# Patient Record
Sex: Male | Born: 1950 | Race: White | Hispanic: No | Marital: Married | Smoking: Never smoker
Health system: Southern US, Community
[De-identification: ages and names within clinical notes are randomized; demographics above are authoritative.]

## PROBLEM LIST (undated history)

## (undated) DIAGNOSIS — R7301 Impaired fasting glucose: Secondary | ICD-10-CM

## (undated) DIAGNOSIS — I10 Essential (primary) hypertension: Secondary | ICD-10-CM

## (undated) DIAGNOSIS — J189 Pneumonia, unspecified organism: Secondary | ICD-10-CM

## (undated) DIAGNOSIS — N200 Calculus of kidney: Secondary | ICD-10-CM

## (undated) DIAGNOSIS — C61 Malignant neoplasm of prostate: Secondary | ICD-10-CM

## (undated) DIAGNOSIS — C4490 Unspecified malignant neoplasm of skin, unspecified: Secondary | ICD-10-CM

## (undated) DIAGNOSIS — Z87442 Personal history of urinary calculi: Secondary | ICD-10-CM

## (undated) DIAGNOSIS — E785 Hyperlipidemia, unspecified: Secondary | ICD-10-CM

## (undated) DIAGNOSIS — E119 Type 2 diabetes mellitus without complications: Secondary | ICD-10-CM

## (undated) DIAGNOSIS — C4359 Malignant melanoma of other part of trunk: Secondary | ICD-10-CM

## (undated) HISTORY — DX: Calculus of kidney: N20.0

## (undated) HISTORY — DX: Malignant melanoma of other part of trunk: C43.59

## (undated) HISTORY — PX: JOINT REPLACEMENT: SHX530

## (undated) HISTORY — DX: Impaired fasting glucose: R73.01

## (undated) HISTORY — PX: COLONOSCOPY: SHX174

## (undated) HISTORY — DX: Hyperlipidemia, unspecified: E78.5

## (undated) HISTORY — DX: Essential (primary) hypertension: I10

## (undated) HISTORY — DX: Type 2 diabetes mellitus without complications: E11.9

## (undated) HISTORY — PX: PROSTATE BIOPSY: SHX241

---
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
## 1981-06-13 DIAGNOSIS — N200 Calculus of kidney: Secondary | ICD-10-CM

## 1981-06-13 HISTORY — DX: Calculus of kidney: N20.0

## 2000-06-14 ENCOUNTER — Encounter: Payer: Self-pay | Admitting: Family Medicine

## 2002-08-05 ENCOUNTER — Encounter: Payer: Self-pay | Admitting: Internal Medicine

## 2008-05-20 ENCOUNTER — Ambulatory Visit: Payer: Self-pay | Admitting: Internal Medicine

## 2008-05-20 DIAGNOSIS — E119 Type 2 diabetes mellitus without complications: Secondary | ICD-10-CM | POA: Insufficient documentation

## 2008-05-20 DIAGNOSIS — E785 Hyperlipidemia, unspecified: Secondary | ICD-10-CM | POA: Insufficient documentation

## 2008-05-20 DIAGNOSIS — I10 Essential (primary) hypertension: Secondary | ICD-10-CM

## 2008-06-19 ENCOUNTER — Ambulatory Visit: Payer: Self-pay | Admitting: Internal Medicine

## 2008-06-23 LAB — CONVERTED CEMR LAB
Albumin: 4.1 g/dL (ref 3.5–5.2)
Chloride: 101 meq/L (ref 96–112)
Cholesterol: 231 mg/dL (ref 0–200)
Direct LDL: 154.4 mg/dL
GFR calc Af Amer: 112 mL/min
GFR calc non Af Amer: 92 mL/min
HDL: 46.7 mg/dL (ref >39.0)
Potassium: 4.5 meq/L (ref 3.5–5.1)
Total Protein: 7 g/dL (ref 6.0–8.3)
VLDL: 38 mg/dL (ref 0–40)

## 2008-07-24 ENCOUNTER — Encounter (INDEPENDENT_AMBULATORY_CARE_PROVIDER_SITE_OTHER): Payer: Self-pay | Admitting: *Deleted

## 2008-12-05 ENCOUNTER — Ambulatory Visit: Payer: Self-pay | Admitting: Internal Medicine

## 2008-12-30 ENCOUNTER — Ambulatory Visit: Payer: Self-pay | Admitting: Internal Medicine

## 2008-12-31 ENCOUNTER — Encounter: Payer: Self-pay | Admitting: Internal Medicine

## 2008-12-31 LAB — CONVERTED CEMR LAB: Fecal Occult Bld: NEGATIVE

## 2009-01-16 ENCOUNTER — Ambulatory Visit: Payer: Self-pay | Admitting: Internal Medicine

## 2009-01-16 LAB — CONVERTED CEMR LAB
BUN: 18 mg/dL (ref 6–23)
Basophils Absolute: 0 10*3/uL (ref 0.0–0.1)
CO2: 26 meq/L (ref 19–32)
Creatinine, Ser: 1 mg/dL (ref 0.4–1.5)
Eosinophils Relative: 3.2 % (ref 0.0–5.0)
Hgb A1c MFr Bld: 6.2 % (ref 4.6–6.5)
MCV: 89.8 fL (ref 78.0–100.0)
Monocytes Absolute: 0.5 10*3/uL (ref 0.1–1.0)
Neutrophils Relative %: 55.3 % (ref 43.0–77.0)
Phosphorus: 3.7 mg/dL (ref 2.3–4.6)
Platelets: 238 10*3/uL (ref 150.0–400.0)
RDW: 12.7 % (ref 11.5–14.6)
TSH: 0.65 microintl units/mL (ref 0.35–5.50)
WBC: 5.1 10*3/uL (ref 4.5–10.5)

## 2009-02-06 ENCOUNTER — Telehealth: Payer: Self-pay | Admitting: Internal Medicine

## 2009-10-27 ENCOUNTER — Ambulatory Visit: Payer: Self-pay | Admitting: Internal Medicine

## 2009-11-20 ENCOUNTER — Ambulatory Visit: Payer: Self-pay | Admitting: Internal Medicine

## 2009-11-23 LAB — CONVERTED CEMR LAB
AST: 26 units/L (ref 0–37)
Alkaline Phosphatase: 62 units/L (ref 39–117)
BUN: 18 mg/dL (ref 6–23)
Basophils Absolute: 0 10*3/uL (ref 0.0–0.1)
Bilirubin, Direct: 0.1 mg/dL (ref 0.0–0.3)
CO2: 29 meq/L (ref 19–32)
Calcium: 8.8 mg/dL (ref 8.4–10.5)
Chloride: 102 meq/L (ref 96–112)
Creatinine, Ser: 0.8 mg/dL (ref 0.4–1.5)
HCT: 42.9 % (ref 39.0–52.0)
Hemoglobin: 14.8 g/dL (ref 13.0–17.0)
Lymphs Abs: 2.1 10*3/uL (ref 0.7–4.0)
MCV: 88.9 fL (ref 78.0–100.0)
Monocytes Absolute: 0.7 10*3/uL (ref 0.1–1.0)
Neutro Abs: 3.2 10*3/uL (ref 1.4–7.7)
Platelets: 272 10*3/uL (ref 150.0–400.0)
Potassium: 3.9 meq/L (ref 3.5–5.1)
RDW: 13.8 % (ref 11.5–14.6)
Total Bilirubin: 0.5 mg/dL (ref 0.3–1.2)
Total CHOL/HDL Ratio: 4
Triglycerides: 121 mg/dL (ref 0.0–149.0)

## 2010-06-16 ENCOUNTER — Ambulatory Visit
Admission: RE | Admit: 2010-06-16 | Discharge: 2010-06-16 | Payer: Self-pay | Source: Home / Self Care | Attending: Family Medicine | Admitting: Family Medicine

## 2010-06-16 DIAGNOSIS — H60339 Swimmer's ear, unspecified ear: Secondary | ICD-10-CM | POA: Insufficient documentation

## 2010-07-12 ENCOUNTER — Ambulatory Visit: Admit: 2010-07-12 | Payer: Self-pay | Admitting: Internal Medicine

## 2010-07-13 NOTE — Assessment & Plan Note (Signed)
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  Past Medical History: Reviewed history from 05/20/2008 and no changes required. Hyperlipidemia Hypertension Impaired fasting glucose  Past Surgical History: Reviewed history from 05/20/2008 and no changes required. Abdominal ultrasound--fatty liver 1/02 1969 Cartilage removal right knee  Family History: Reviewed history from 05/20/2008 and no changes required. Dad died of lung cancer @70 . Had CVA Mom died @70  MI, then MRSA 2 brothers--1 with early CAD in 27's HTN in everybody No diabetes No colon or prostate cancer  Social History: Reviewed history from 12/05/2008 and no changes required. Retired then went back, then unemployed. Always in retail management Married--2 daughters Never Smoked Alcohol use-yes Tries to exercise  Review of Systems       weight is stable ongoing sleep problems---he relates to stress. 4-5 hours per night only for decades  Physical Exam  General:  alert and normal appearance.   Neck:  supple, no masses, no thyromegaly, no  carotid bruits, and no cervical lymphadenopathy.   Lungs:  normal respiratory effort and normal breath sounds.   Heart:  normal rate, regular rhythm, no murmur, and no gallop.   Abdomen:  soft and non-tender.   Msk:  no joint tenderness and no joint swelling.   Pulses:  2+ in feet Extremities:  no edema Psych:  normally interactive, good eye contact, not anxious appearing, and not depressed appearing.     Impression & Recommendations:  Problem # 1:  HYPERTENSION (ICD-401.9) up some no changes for now discussed fitness  His updated medication list for this problem includes:    Hyzaar 50-12.5 Mg Tabs (Losartan potassium-hctz) .Marland Kitchen... 1 tab daily for high blood pressure  BP today: 148/90 Prior BP: 130/84 (12/05/2008)  Labs Reviewed: K+: 3.9 (01/16/2009) Creat: : 1.0 (01/16/2009)   Chol: 231 (06/19/2008)   HDL: 46.7 (06/19/2008)   LDL: DEL (06/19/2008)   TG: 189 (06/19/2008)  Problem # 2:  HYPERLIPIDEMIA (ICD-272.4) Assessment: Unchanged reasonable with meds no changes   His updated medication list for this problem includes:    Pravastatin Sodium 40 Mg Tabs (Pravastatin sodium) .Marland Kitchen... 1 daily  Labs Reviewed: SGOT: 31 (06/19/2008)   SGPT: 38 (06/19/2008)   HDL:46.7 (06/19/2008)  LDL:DEL (06/19/2008)  Chol:231 (06/19/2008)  Trig:189 (06/19/2008)  Problem # 3:  IMPAIRED FASTING GLUCOSE (ICD-790.21) Assessment: Comment Only discussed high risk for diabetes in the next few years  Complete Medication List: 1)  Pravastatin Sodium 40 Mg Tabs (Pravastatin sodium) .Marland KitchenMarland KitchenMarland Kitchen  1 daily 2)  Hyzaar 50-12.5 Mg Tabs (Losartan potassium-hctz) .Marland Kitchen.. 1 tab daily for high blood pressure  Patient Instructions: 1)  Please set up fasting labs soon 2)  HgbA1c (790.21), lipid, hepatic (272.4), renal, CBC with diff (401.9) 3)  Please schedule a follow-up appointment in 6 months for physical Prescriptions: HYZAAR 50-12.5 MG TABS (LOSARTAN POTASSIUM-HCTZ) 1 tab daily for high blood pressure  #30 x 12    Entered by:   Mervin Hack CMA (AAMA)   Authorized by:   Cindee Salt MD   Signed by:   Mervin Hack CMA (AAMA) on 10/27/2009   Method used:   Electronically to        [facility] (retail)       [address]       [place], [place]  [address]       Ph: [phone]       Fax: [phone]   RxID:   [id number] PRAVASTATIN SODIUM 40 MG TABS (PRAVASTATIN SODIUM) 1 daily  #30 Tablet x 12   Entered by:   Mervin Hack CMA (AAMA)   Authorized by:   Cindee Salt MD   Signed by:   Mervin Hack CMA (AAMA) on 10/27/2009   Method used:   Electronically to        [facility] (retail)       [address]       [place], [place]  [address]       Ph: [phone]       Fax: [phone]   RxID:   [id number]   Current Allergies (reviewed today): No known allergies

## 2010-07-15 NOTE — Assessment & Plan Note (Signed)
Summary: EAR PAIN/CLE   Vital Signs:  Patient profile:   60 year old male Height:      70 inches Weight:      232.25 pounds BMI:     33.44 Temp:     98 degrees F oral Pulse rate:   60 / minute Pulse rhythm:   regular BP sitting:   122 / 76  (left arm) Cuff size:   large  Vitals Entered By: Delilah Shan CMA Brinley Rosete Dull) (June 16, 2010 4:05 PM) CC: Ear Pain   History of Present Illness: R ear pain.  Feels clogged.  'I thought it was going to get better.'  Is sore to touch.  going for about 3 weeks.  No FCNAVDR.  Feeling well o/w.    Allergies: No Known Drug Allergies  Review of Systems       See HPI.  Otherwise negative.    Physical Exam  General:  no apparent distress normocephalic atraumatic L tm wnl R tm partiall obscured with purulent debris in the canal.  mastoid and pinna not tender to palpation  no la mucous membranes moist neck supple   Impression & Recommendations:  Problem # 1:  OTITIS EXTERNA, ACUTE, RIGHT (ICD-380.12) I didn't clear the canal today since he has been reported tender when he tried to clean his ear with qtip. I adivsed him not to clean with qtip.  He still has some clearance (so I think the drops can get deep enough to work) and he is okay for outpatient follow up.  I don't think this is fungal- he doesn't have uncontrolled DM.  follow up as needed.  He agrees.  Orders: Prescription Created Electronically [id number])  His updated medication list for this problem includes:    Cortisporin 3.5-10000-1 Soln (Neomycin-polymyxin-hc) .Marland KitchenMarland KitchenMarland KitchenMarland Kitchen 4 drops in the r ear three times a day,  leave in for 5 minutes  Complete Medication List: 1)  Pravastatin Sodium 40 Mg Tabs (Pravastatin sodium) .Marland Kitchen.. 1 daily 2)  Hyzaar 50-12.5 Mg Tabs (Losartan potassium-hctz) .Marland Kitchen.. 1 tab daily for high blood pressure 3)  Vitamin C 500 Mg Tabs (Ascorbic acid) .... Once daily 4)  Vitamin B Complex-c Caps (B complex-c) .... Once daily 5)  Fish Oil Oil (Fish oil) .... Once daily 6)   Cortisporin 3.5-10000-1 Soln (Neomycin-polymyxin-hc) .... 4 drops in the r ear three times a day,  leave in for 5 minutes  Patient Instructions: 1)  Use the drops as directed and let us know if you aren't improving.  Take care.  Prescriptions: CORTISPORIN 3.5-10000-1 SOLN (NEOMYCIN-POLYMYXIN-HC) 4 drops in the R ear three times a day,  leave in for 5 minutes  #48mL x 0   Entered and Authorized by:   Crawford Givens MD   Signed by:   Crawford Givens MD on 06/16/2010   Method used:   Electronically to        [facility] [phone]* (retail)       [address] [place], [place]  [address]       Ph: [phone]       Fax: [phone]   RxID:   [id number]    Orders Added: 1)  Est. Patient Level III [40347] 2)  Prescription Created Electronically [phone]    Current Allergies (reviewed today): No known allergies

## 2010-09-12 HISTORY — PX: OTHER SURGICAL HISTORY: SHX169

## 2010-10-22 ENCOUNTER — Other Ambulatory Visit: Payer: Self-pay | Admitting: *Deleted

## 2010-10-22 MED ORDER — LOSARTAN POTASSIUM-HCTZ 50-12.5 MG PO TABS: 1.0000 | ORAL_TABLET | Freq: Every day | ORAL | Status: DC

## 2010-10-25 ENCOUNTER — Other Ambulatory Visit: Payer: Self-pay | Admitting: *Deleted

## 2010-10-25 MED ORDER — LOSARTAN POTASSIUM-HCTZ 50-12.5 MG PO TABS: 1.0000 | ORAL_TABLET | Freq: Every day | ORAL | Status: DC

## 2011-06-14 DIAGNOSIS — C4359 Malignant melanoma of other part of trunk: Secondary | ICD-10-CM

## 2011-06-14 DIAGNOSIS — C4490 Unspecified malignant neoplasm of skin, unspecified: Secondary | ICD-10-CM

## 2011-06-14 HISTORY — DX: Unspecified malignant neoplasm of skin, unspecified: C44.90

## 2011-06-14 HISTORY — DX: Malignant melanoma of other part of trunk: C43.59

## 2011-08-15 ENCOUNTER — Encounter: Payer: Self-pay | Admitting: Internal Medicine

## 2011-08-17 ENCOUNTER — Encounter: Payer: Self-pay | Admitting: Internal Medicine

## 2011-08-17 ENCOUNTER — Ambulatory Visit (INDEPENDENT_AMBULATORY_CARE_PROVIDER_SITE_OTHER): Payer: BC Managed Care – PPO | Admitting: Internal Medicine

## 2011-08-17 VITALS — BP 144/70 | HR 66 | Temp 98.4°F | Ht 71.0 in | Wt 232.0 lb

## 2011-08-17 DIAGNOSIS — Z0000 Encounter for general adult medical examination without abnormal findings: Secondary | ICD-10-CM | POA: Insufficient documentation

## 2011-08-17 DIAGNOSIS — R7301 Impaired fasting glucose: Secondary | ICD-10-CM

## 2011-08-17 DIAGNOSIS — I10 Essential (primary) hypertension: Secondary | ICD-10-CM

## 2011-08-17 DIAGNOSIS — Z85820 Personal history of malignant melanoma of skin: Secondary | ICD-10-CM | POA: Insufficient documentation

## 2011-08-17 DIAGNOSIS — Z1211 Encounter for screening for malignant neoplasm of colon: Secondary | ICD-10-CM

## 2011-08-17 DIAGNOSIS — E785 Hyperlipidemia, unspecified: Secondary | ICD-10-CM

## 2011-08-17 LAB — BASIC METABOLIC PANEL
CO2: 28 mEq/L (ref 19–32)
Calcium: 9.5 mg/dL (ref 8.4–10.5)
Glucose, Bld: 135 mg/dL — ABNORMAL HIGH (ref 70–99)
Sodium: 136 mEq/L (ref 135–145)

## 2011-08-17 LAB — LIPID PANEL
HDL: 53.2 mg/dL (ref >39.00)
Triglycerides: 245 mg/dL — ABNORMAL HIGH (ref 0.0–149.0)

## 2011-08-17 LAB — HEPATIC FUNCTION PANEL
Albumin: 4 g/dL (ref 3.5–5.2)
Total Protein: 6.7 g/dL (ref 6.0–8.3)

## 2011-08-17 LAB — CBC WITH DIFFERENTIAL/PLATELET
Basophils Absolute: 0 10*3/uL (ref 0.0–0.1)
Eosinophils Absolute: 0.1 10*3/uL (ref 0.0–0.7)
HCT: 45.2 % (ref 39.0–52.0)
Hemoglobin: 15.2 g/dL (ref 13.0–17.0)
Lymphocytes Relative: 26.6 % (ref 12.0–46.0)
Lymphs Abs: 1.4 10*3/uL (ref 0.7–4.0)
MCHC: 33.6 g/dL (ref 30.0–36.0)
Monocytes Absolute: 0.6 10*3/uL (ref 0.1–1.0)
Neutro Abs: 3.1 10*3/uL (ref 1.4–7.7)
RDW: 14.1 % (ref 11.5–14.6)

## 2011-08-17 LAB — HEMOGLOBIN A1C: Hgb A1c MFr Bld: 6.3 % (ref 4.6–6.5)

## 2011-08-17 MED ORDER — LOSARTAN POTASSIUM-HCTZ 50-12.5 MG PO TABS: 1.0000 | ORAL_TABLET | Freq: Every day | ORAL | Status: DC

## 2011-08-17 MED ORDER — PRAVASTATIN SODIUM 40 MG PO TABS: 40.0000 mg | ORAL_TABLET | Freq: Every day | ORAL | Status: DC

## 2011-08-17 NOTE — Assessment & Plan Note (Signed)
Really needs to lose weight If over 126--will refer to [facility]

## 2011-08-17 NOTE — Assessment & Plan Note (Signed)
Lab Results  Component Value Date   LDLCALC 97 11/20/2009   Will recheck labs on med

## 2011-08-17 NOTE — Progress Notes (Signed)
Subjective:    Patient ID: Ricardo Reese, male    DOB: [birth year], 61 y.o.   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 fish oil-omega-3 fatty acids 1000 MG capsule Take 1 g by mouth daily.        No Known Allergies  Past Medical History  Diagnosis Date  . Hyperlipidemia   . Hypertension   . Impaired fasting glucose   . Melanoma of back 2013    Dr Adolphus Birchwood    Past Surgical History  Procedure Date  . Knee surgery 1969    cartilage removal right knee     Family History  Problem Relation Age of Onset  . Stroke Father   . Diabetes Neg Hx     History   Social History  . Marital Status: Married    Spouse Name: N/A    Number of Children: 2  . Years of Education: N/A   Occupational History  . Store Radiation protection practitioner   Social History Main Topics  . Smoking status: Never Smoker   . Smokeless tobacco: Never Used  . Alcohol Use: No  . Drug Use: No  . Sexually Active: Not on file   Other Topics Concern  . Not on file   Social History Narrative  . No narrative on file   Review of Systems  Constitutional: Negative for fatigue and unexpected weight change.       Wears seat belt  HENT: Negative for hearing loss, congestion, rhinorrhea and tinnitus.        Needed tooth implant Regular with dentist  Eyes: Negative for visual disturbance.       No diplopia or unilateral vision loss  Respiratory: Negative for cough, chest tightness and shortness of breath.   Cardiovascular:  Negative for chest pain, palpitations and leg swelling.  Gastrointestinal: Negative for nausea, vomiting, abdominal pain, constipation and blood in stool.       No heartburn  Genitourinary: Negative for dysuria, urgency, frequency and difficulty urinating.       No sexual problems  Musculoskeletal: Positive for arthralgias. Negative for back pain and joint swelling.       Occ right knee pain--nothing persistent and no meds  Skin: Negative for rash.       Melanoma as noted  Neurological: Negative for dizziness, syncope, weakness, light-headedness, numbness and headaches.  Hematological: Negative for adenopathy. Does not bruise/bleed easily.  Psychiatric/Behavioral: Negative for sleep disturbance and dysphoric mood. The patient is nervous/anxious.        Only sleeps 4 hours a night. No change. No daytime somnolence Worried about the melanoma and maybe needing to move for a job       Objective:   Physical Exam  Constitutional: He is oriented to person, place, and time. He appears well-developed and well-nourished. No distress.  HENT:  Head: Normocephalic and atraumatic.  Right Ear: External ear normal.  Left Ear: External ear normal.  Mouth/Throat: Oropharynx is clear  and moist. No oropharyngeal exudate.  Eyes: Conjunctivae and EOM are normal. Pupils are equal, round, and reactive to light.  Neck: Normal range of motion. Neck supple. No thyromegaly present.  Cardiovascular: Normal rate, regular rhythm, normal heart sounds and intact distal pulses.  Exam reveals no gallop.   No murmur heard. Pulmonary/Chest: Effort normal and breath sounds normal. No respiratory distress. He has no wheezes. He has no rales.  Abdominal: Soft. There is no tenderness.  Musculoskeletal: Normal range of motion. He exhibits no edema and no tenderness.  Lymphadenopathy:    He has no cervical adenopathy.  Neurological: He is alert and oriented to person, place, and time.  Skin: No rash noted.       Multiple  benign nevi  Psychiatric: He has a normal mood and affect. His behavior is normal. Judgment and thought content normal.          Assessment & Plan:

## 2011-08-17 NOTE — Assessment & Plan Note (Signed)
Generally healthy but needs to work on fitness Will do PSA after discussion Colonoscopy

## 2011-08-17 NOTE — Assessment & Plan Note (Signed)
BP Readings from Last 3 Encounters:  08/17/11 144/70  06/16/10 122/76  10/27/09 148/90   Not at goal  Discussed lifestyle  No changes for now

## 2011-08-22 ENCOUNTER — Encounter: Payer: Self-pay | Admitting: Internal Medicine

## 2011-08-31 ENCOUNTER — Encounter: Payer: Self-pay | Admitting: *Deleted

## 2011-09-29 ENCOUNTER — Ambulatory Visit (AMBULATORY_SURGERY_CENTER): Payer: BC Managed Care – PPO | Admitting: *Deleted

## 2011-09-29 ENCOUNTER — Encounter: Payer: Self-pay | Admitting: Internal Medicine

## 2011-09-29 VITALS — Ht 71.0 in | Wt 227.8 lb

## 2011-09-29 DIAGNOSIS — Z1211 Encounter for screening for malignant neoplasm of colon: Secondary | ICD-10-CM

## 2011-09-29 MED ORDER — PEG-KCL-NACL-NASULF-NA ASC-C 100 G PO SOLR: ORAL | Status: DC

## 2011-10-12 ENCOUNTER — Other Ambulatory Visit: Payer: BC Managed Care – PPO | Admitting: Internal Medicine

## 2011-10-31 ENCOUNTER — Other Ambulatory Visit: Payer: Self-pay | Admitting: Internal Medicine

## 2011-10-31 DIAGNOSIS — R7301 Impaired fasting glucose: Secondary | ICD-10-CM

## 2011-11-02 ENCOUNTER — Other Ambulatory Visit: Payer: BC Managed Care – PPO

## 2011-11-03 ENCOUNTER — Encounter: Payer: Self-pay | Admitting: Internal Medicine

## 2011-11-03 ENCOUNTER — Ambulatory Visit (AMBULATORY_SURGERY_CENTER): Payer: BC Managed Care – PPO | Admitting: Internal Medicine

## 2011-11-03 VITALS — BP 182/92 | HR 59 | Temp 97.2°F | Resp 20 | Ht 71.0 in | Wt 227.0 lb

## 2011-11-03 DIAGNOSIS — Z1211 Encounter for screening for malignant neoplasm of colon: Secondary | ICD-10-CM

## 2011-11-03 MED ORDER — SODIUM CHLORIDE 0.9 % IV SOLN: 500.0000 mL | INTRAVENOUS | Status: DC

## 2011-11-03 NOTE — Patient Instructions (Signed)

## 2011-11-03 NOTE — Progress Notes (Signed)
Patient did not experience any of the following events: a burn prior to discharge; a fall within the facility; wrong site/side/patient/procedure/implant event; or a hospital transfer or hospital admission upon discharge from the facility. (G8907) Patient did not have preoperative order for IV antibiotic SSI prophylaxis. (G8918)  

## 2011-11-03 NOTE — Op Note (Signed)
[facility] [address] [place], [place]  [address]  COLONOSCOPY PROCEDURE REPORT  PATIENT:  Ricardo Reese, Ricardo Reese  MR#:  [id number] BIRTHDATE:  1950-08-09, 61 yrs. old  GENDER:  male ENDOSCOPIST:  Iva Boop, MD, [facility] REF. BY:  Tillman Abide, M.D. PROCEDURE DATE:  11/03/2011 PROCEDURE:  Colonoscopy 19147 ASA CLASS:  Class II INDICATIONS:  Routine Risk Screening MEDICATIONS:   These medications were titrated to patient response per physician's verbal order, Fentanyl 50 mcg IV, Versed 8 mg IV  DESCRIPTION OF PROCEDURE:   After the risks benefits and alternatives of the procedure were thoroughly explained, informed consent was obtained.  Digital rectal exam was performed and revealed no abnormalities and normal prostate.   The LB [id number] endoscope was introduced through the anus and advanced to the cecum, which was identified by both the appendix and ileocecal valve, without limitations.  The quality of the prep was excellent, using MoviPrep.  The instrument was then slowly withdrawn as the colon was fully examined. <<PROCEDUREIMAGES>>  FINDINGS:  A normal appearing cecum, ileocecal valve, and appendiceal orifice were identified. The ascending, hepatic flexure, transverse, splenic flexure, descending, sigmoid colon, and rectum appeared unremarkable. Including right colon retroflexion.   Retroflexed views in the rectum revealed no abnormalities.    The time to cecum = 1:48 minutes. The scope was then withdrawn in 9:44 minutes from the cecum and the procedure completed. COMPLICATIONS:  None ENDOSCOPIC IMPRESSION: 1) Normal colonoscopy, excellent prep  REPEAT EXAM:  In 10 year(s) for routine screening colonoscopy.  Iva Boop, MD, [facility]  CC:  Karie Schwalbe, MD and The Patient  n. eSIGNED:   Iva Boop at 11/03/2011 01:34 PM  [place], [place], [phone]

## 2011-11-03 NOTE — Progress Notes (Signed)
Blood pressure high- 182/92, 172/118, Dr. Leone Payor aware.  Pt didn't take his Hyzaar today  1320 BP 147/69

## 2011-11-04 ENCOUNTER — Telehealth: Payer: Self-pay | Admitting: *Deleted

## 2011-11-04 NOTE — Telephone Encounter (Signed)
  Follow up Call-  Call back number 11/03/2011  Post procedure Call Back phone  # [phone]  Permission to leave phone message Yes     Left message.

## 2011-11-09 ENCOUNTER — Other Ambulatory Visit (INDEPENDENT_AMBULATORY_CARE_PROVIDER_SITE_OTHER): Payer: BC Managed Care – PPO

## 2011-11-09 DIAGNOSIS — R7301 Impaired fasting glucose: Secondary | ICD-10-CM

## 2011-11-09 LAB — GLUCOSE, RANDOM: Glucose, Bld: 114 mg/dL — ABNORMAL HIGH (ref 70–99)

## 2011-11-11 ENCOUNTER — Encounter: Payer: Self-pay | Admitting: *Deleted

## 2011-11-15 ENCOUNTER — Other Ambulatory Visit: Payer: Self-pay | Admitting: *Deleted

## 2011-11-15 MED ORDER — LOSARTAN POTASSIUM-HCTZ 50-12.5 MG PO TABS: 1.0000 | ORAL_TABLET | Freq: Every day | ORAL | Status: DC

## 2011-11-23 ENCOUNTER — Encounter: Payer: Self-pay | Admitting: Internal Medicine

## 2012-04-17 ENCOUNTER — Telehealth: Payer: Self-pay | Admitting: *Deleted

## 2012-04-17 NOTE — Telephone Encounter (Signed)
Patient's wife calling asking for rx for shingles vaccine sent to [facility] in [place], pt has canceled all future appts and last seen march 2013

## 2012-04-18 NOTE — Telephone Encounter (Signed)
Okay to send Rx for zostavax

## 2012-04-19 MED ORDER — ZOSTER VACCINE LIVE 19400 UNT/0.65ML ~~LOC~~ SOLR: 0.6500 mL | Freq: Once | SUBCUTANEOUS | Status: DC

## 2012-04-19 NOTE — Telephone Encounter (Signed)
rx sent to pharmacy by e-script Spoke with patient's wife and advised results  

## 2012-08-16 ENCOUNTER — Encounter: Payer: BC Managed Care – PPO | Admitting: Internal Medicine

## 2012-08-24 ENCOUNTER — Other Ambulatory Visit: Payer: Self-pay | Admitting: Internal Medicine

## 2012-09-21 ENCOUNTER — Encounter: Payer: BC Managed Care – PPO | Admitting: Internal Medicine

## 2012-09-22 ENCOUNTER — Other Ambulatory Visit: Payer: Self-pay | Admitting: Internal Medicine

## 2012-10-21 ENCOUNTER — Other Ambulatory Visit: Payer: Self-pay | Admitting: Internal Medicine

## 2012-11-23 ENCOUNTER — Other Ambulatory Visit: Payer: Self-pay | Admitting: Internal Medicine

## 2012-12-25 ENCOUNTER — Other Ambulatory Visit: Payer: Self-pay | Admitting: Internal Medicine

## 2012-12-25 NOTE — Telephone Encounter (Signed)
This pt has canceled the last 3 physicals, and now has another scheduled 01/04/13, ok to refill? Last seen 08/2011

## 2012-12-25 NOTE — Telephone Encounter (Signed)
Refill for 1 month only

## 2013-01-04 ENCOUNTER — Ambulatory Visit (INDEPENDENT_AMBULATORY_CARE_PROVIDER_SITE_OTHER): Payer: BC Managed Care – PPO | Admitting: Internal Medicine

## 2013-01-04 ENCOUNTER — Encounter: Payer: Self-pay | Admitting: Internal Medicine

## 2013-01-04 VITALS — BP 136/72 | HR 62 | Temp 98.5°F | Wt 228.0 lb

## 2013-01-04 DIAGNOSIS — E785 Hyperlipidemia, unspecified: Secondary | ICD-10-CM

## 2013-01-04 DIAGNOSIS — R7301 Impaired fasting glucose: Secondary | ICD-10-CM

## 2013-01-04 DIAGNOSIS — I10 Essential (primary) hypertension: Secondary | ICD-10-CM

## 2013-01-04 DIAGNOSIS — Z0000 Encounter for general adult medical examination without abnormal findings: Secondary | ICD-10-CM

## 2013-01-04 LAB — CBC WITH DIFFERENTIAL/PLATELET
Basophils Absolute: 0 10*3/uL (ref 0.0–0.1)
Basophils Relative: 0.5 % (ref 0.0–3.0)
Eosinophils Relative: 2.1 % (ref 0.0–5.0)
HCT: 46.5 % (ref 39.0–52.0)
Hemoglobin: 15.3 g/dL (ref 13.0–17.0)
Lymphocytes Relative: 27.2 % (ref 12.0–46.0)
Lymphs Abs: 1.8 10*3/uL (ref 0.7–4.0)
Monocytes Relative: 11.6 % (ref 3.0–12.0)
Neutro Abs: 3.9 10*3/uL (ref 1.4–7.7)
RBC: 5.21 Mil/uL (ref 4.22–5.81)
WBC: 6.7 10*3/uL (ref 4.5–10.5)

## 2013-01-04 LAB — BASIC METABOLIC PANEL
Calcium: 9.4 mg/dL (ref 8.4–10.5)
GFR: 95.91 mL/min (ref >60.00)
Glucose, Bld: 113 mg/dL — ABNORMAL HIGH (ref 70–99)
Potassium: 4 mEq/L (ref 3.5–5.1)
Sodium: 136 mEq/L (ref 135–145)

## 2013-01-04 LAB — HEPATIC FUNCTION PANEL
AST: 21 U/L (ref 0–37)
Albumin: 4.3 g/dL (ref 3.5–5.2)
Alkaline Phosphatase: 57 U/L (ref 39–117)
Bilirubin, Direct: 0 mg/dL (ref 0.0–0.3)
Total Protein: 7.3 g/dL (ref 6.0–8.3)

## 2013-01-04 LAB — LIPID PANEL
HDL: 47.6 mg/dL (ref >39.00)
LDL Cholesterol: 112 mg/dL — ABNORMAL HIGH (ref 0–99)
Total CHOL/HDL Ratio: 4
VLDL: 28.2 mg/dL (ref 0.0–40.0)

## 2013-01-04 LAB — HEMOGLOBIN A1C: Hgb A1c MFr Bld: 6.4 % (ref 4.6–6.5)

## 2013-01-04 MED ORDER — LOSARTAN POTASSIUM-HCTZ 50-12.5 MG PO TABS: 1.0000 | ORAL_TABLET | Freq: Every day | ORAL | Status: DC

## 2013-01-04 MED ORDER — PRAVASTATIN SODIUM 40 MG PO TABS: 40.0000 mg | ORAL_TABLET | Freq: Every day | ORAL | Status: DC

## 2013-01-04 NOTE — Assessment & Plan Note (Signed)
Has been more regular with his meds

## 2013-01-04 NOTE — Assessment & Plan Note (Signed)
BP Readings from Last 3 Encounters:  01/04/13 136/72  11/03/11 182/92  08/17/11 144/70   BP is okay now

## 2013-01-04 NOTE — Patient Instructions (Signed)
DASH Diet  The DASH diet stands for "Dietary Approaches to Stop Hypertension." It is a healthy eating plan that has been shown to reduce high blood pressure (hypertension) in as little as 14 days, while also possibly providing other significant health benefits. These other health benefits include reducing the risk of breast cancer after menopause and reducing the risk of type 2 diabetes, heart disease, colon cancer, and stroke. Health benefits also include weight loss and slowing kidney failure in patients with chronic kidney disease.   DIET GUIDELINES  · Limit salt (sodium). Your diet should contain less than 1500 mg of sodium daily.  · Limit refined or processed carbohydrates. Your diet should include mostly whole grains. Desserts and added sugars should be used sparingly.  · Include small amounts of heart-healthy fats. These types of fats include nuts, oils, and tub margarine. Limit saturated and trans fats. These fats have been shown to be harmful in the body.  CHOOSING FOODS   The following food groups are based on a 2000 calorie diet. See your Registered Dietitian for individual calorie needs.  Grains and Grain Products (6 to 8 servings daily)  · Eat More Often: Whole-wheat bread, brown rice, whole-grain or wheat pasta, quinoa, popcorn without added fat or salt (air popped).  · Eat Less Often: White bread, white pasta, white rice, cornbread.  Vegetables (4 to 5 servings daily)  · Eat More Often: Fresh, frozen, and canned vegetables. Vegetables may be raw, steamed, roasted, or grilled with a minimal amount of fat.  · Eat Less Often/Avoid: Creamed or fried vegetables. Vegetables in a cheese sauce.  Fruit (4 to 5 servings daily)  · Eat More Often: All fresh, canned (in natural juice), or frozen fruits. Dried fruits without added sugar. One hundred percent fruit juice (½ cup [237 mL] daily).  · Eat Less Often: Dried fruits with added sugar. Canned fruit in light or heavy syrup.  Lean Meats, Fish, and Poultry (2  servings or less daily. One serving is 3 to 4 oz [85-114 g]).  · Eat More Often: Ninety percent or leaner ground beef, tenderloin, sirloin. Round cuts of beef, chicken breast, turkey breast. All fish. Grill, bake, or broil your meat. Nothing should be fried.  · Eat Less Often/Avoid: Fatty cuts of meat, turkey, or chicken leg, thigh, or wing. Fried cuts of meat or fish.  Dairy (2 to 3 servings)  · Eat More Often: Low-fat or fat-free milk, low-fat plain or light yogurt, reduced-fat or part-skim cheese.  · Eat Less Often/Avoid: Milk (whole, 2%). Whole milk yogurt. Full-fat cheeses.  Nuts, Seeds, and Legumes (4 to 5 servings per week)  · Eat More Often: All without added salt.  · Eat Less Often/Avoid: Salted nuts and seeds, canned beans with added salt.  Fats and Sweets (limited)  · Eat More Often: Vegetable oils, tub margarines without trans fats, sugar-free gelatin. Mayonnaise and salad dressings.  · Eat Less Often/Avoid: Coconut oils, palm oils, butter, stick margarine, cream, half and half, cookies, candy, pie.  FOR MORE INFORMATION  The Dash Diet Eating Plan: www.dashdiet.org  Document Released: 05/19/2011 Document Revised: 08/22/2011 Document Reviewed: 05/19/2011  ExitCare® Patient Information ©2014 [employer].

## 2013-01-04 NOTE — Assessment & Plan Note (Addendum)
Healthy but out of shape Discussed fitness PSA next year

## 2013-01-04 NOTE — Progress Notes (Signed)
Subjective:    Patient ID: Ricardo Reese, male    DOB: 01-27-1951, 62 y.o.   MRN: [id number]  HPI Here for physical Feels fine Walks a lot at work-- but no other exercise Discussed   Reviewed cholesterol levels last year Missed a lot of doses Discussed taking in the morning  Current Outpatient Prescriptions on File Prior to Visit  Medication Sig Dispense Refill  . Ascorbic Acid (VITAMIN C) 1000 MG tablet Take 1,000 mg by mouth daily.      Marland Kitchen aspirin 81 MG tablet Take 81 mg by mouth daily.      . B Complex-C (SUPER B COMPLEX PO) Take by mouth daily.      . fish oil-omega-3 fatty acids 1000 MG capsule Take 1 g by mouth daily.      Marland Kitchen losartan-hydrochlorothiazide (HYZAAR) 50-12.5 MG per tablet TAKE ONE TABLET BY MOUTH EVERY DAY  30 tablet  0  . pravastatin (PRAVACHOL) 40 MG tablet TAKE ONE TABLET BY MOUTH EVERY DAY  30 tablet  0   No current facility-administered medications on file prior to visit.    No Known Allergies  Past Medical History  Diagnosis Date  . Hyperlipidemia   . Hypertension   . Impaired fasting glucose   . Melanoma of back 2013    Dr Adolphus Birchwood  . Kidney stone 1983  . Cancer     basal cell- face    Past Surgical History  Procedure Laterality Date  . Knee surgery  1969    cartilage removal right knee   . Melanoma back  09/2010    Family History  Problem Relation Age of Onset  . Stroke Father   . Diabetes Neg Hx   . Colon cancer Neg Hx   . Stomach cancer Neg Hx   . Esophageal cancer Neg Hx   . Rectal cancer Neg Hx     History   Social History  . Marital Status: Married    Spouse Name: N/A    Number of Children: 2  . Years of Education: N/A   Occupational History  . Store Radiation protection practitioner   Social History Main Topics  . Smoking status: Never Smoker   . Smokeless tobacco: Never Used  . Alcohol Use: Yes     Comment: occasional beer  . Drug Use: No  . Sexually Active: Not on file   Other Topics Concern  . Not on file   Social History  Narrative  . No narrative on file   Review of Systems  Constitutional: Negative for fatigue and unexpected weight change.       Wears seat belt  HENT: Positive for dental problem. Negative for hearing loss, congestion, rhinorrhea and tinnitus.        Recent implant  Eyes: Negative for visual disturbance.       No diplopia or unilateral vision loss  Respiratory: Positive for shortness of breath. Negative for cough and chest tightness.        Some DOE--like carrying grandchild up stairs  Cardiovascular: Negative for chest pain, palpitations and leg swelling.  Gastrointestinal: Negative for nausea, vomiting, abdominal pain, constipation and blood in stool.       No heartburn  Endocrine: Negative for cold intolerance and heat intolerance.  Genitourinary: Negative for urgency, frequency and difficulty urinating.       No ED  Musculoskeletal: Negative for back pain, joint swelling and arthralgias.  Skin: Negative for rash.       Regular with Dr Adolphus Birchwood  Allergic/Immunologic: Negative for environmental allergies and immunocompromised state.  Neurological: Negative for dizziness, syncope, weakness, light-headedness, numbness and headaches.  Hematological: Negative for adenopathy. Does not bruise/bleed easily.  Psychiatric/Behavioral: Negative for sleep disturbance and dysphoric mood. The patient is not nervous/anxious.        Objective:   Physical Exam  Constitutional: He is oriented to person, place, and time. He appears well-developed and well-nourished. No distress.  HENT:  Head: Normocephalic and atraumatic.  Right Ear: External ear normal.  Left Ear: External ear normal.  Mouth/Throat: Oropharynx is clear and moist. No oropharyngeal exudate.  Eyes: Conjunctivae and EOM are normal. Pupils are equal, round, and reactive to light.  Neck: Normal range of motion. Neck supple. No thyromegaly present.  Cardiovascular: Normal rate, regular rhythm, normal heart sounds and intact distal  pulses.  Exam reveals no gallop.   No murmur heard. Pulmonary/Chest: Effort normal and breath sounds normal. No respiratory distress. He has no wheezes. He has no rales.  Abdominal: Soft. There is no tenderness.  Musculoskeletal: He exhibits no edema and no tenderness.  Lymphadenopathy:    He has no cervical adenopathy.  Neurological: He is alert and oriented to person, place, and time.  Skin: No rash noted. No erythema.  Multiple nevi  Psychiatric: He has a normal mood and affect. His behavior is normal.          Assessment & Plan:

## 2013-01-04 NOTE — Addendum Note (Signed)
Addended by: Sueanne Margarita on: 01/04/2013 12:48 PM   Modules accepted: Orders

## 2013-01-04 NOTE — Assessment & Plan Note (Signed)
Must lose some weight

## 2013-01-07 ENCOUNTER — Encounter: Payer: Self-pay | Admitting: *Deleted

## 2014-01-07 ENCOUNTER — Encounter: Payer: BC Managed Care – PPO | Admitting: Internal Medicine

## 2014-02-20 ENCOUNTER — Other Ambulatory Visit: Payer: Self-pay | Admitting: Internal Medicine

## 2014-02-24 ENCOUNTER — Telehealth: Payer: Self-pay | Admitting: Internal Medicine

## 2014-02-24 MED ORDER — LOSARTAN POTASSIUM-HCTZ 50-12.5 MG PO TABS: 1.0000 | ORAL_TABLET | Freq: Every day | ORAL | Status: DC

## 2014-02-24 MED ORDER — PRAVASTATIN SODIUM 40 MG PO TABS: 40.0000 mg | ORAL_TABLET | Freq: Every day | ORAL | Status: DC

## 2014-02-24 NOTE — Telephone Encounter (Signed)
Spoke with pt and advised did not see a second request for med from [place] but would refill pravastatin and losartan-HCTZ. Apologized to pt and pt was appreciative.

## 2014-02-24 NOTE — Telephone Encounter (Signed)
I have a unique set of circumstances going on in life right now, and I need your help, please. I maintain my home address in [place], however, i am working in [place], [place]. So I am now out of my 2 prescriptions, and i don't seem to be getting anywhere getting them filled. i am utilizing the [employer] in [place], [place]. 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 The Pharmacist called [place] at [facility], where my Doctor is located, and the he was told that i would need to make an appointment. Okay, so i called and made an appointment. My appointment is on March 26, 2014 with Dr. Silvio Pate. The person I was speaking with at the [facility] told me I would need to speak to the Triage Nurse to let her/him know that the store would be re-faxing the refill request. So, the phone rang and went to an automated message system. I then left a verbal message for the nurse regarding the request. The [place] Pharmacist then re-faxed the request to the [place] offrice 2 days ago, and still no response from that office. As I stated, I am now out of both medications, I follwed your rules, made an appointment, tried to talk to the triage individual, left a verbal message, and I have gotten no response from anybody at [employer]. I am more than frustrated at this point, and I do not understand what the issue is. I need to get these medications, but nobody at [facility] seems to care about that, and ultimately me. I was under the impression that you are a Acupuncturist. I don't understand why it is so hard to get someone to communicate with me from [place]. Doesn't seem like any of you care about at least 1 of your patients. me. I can be reached via email, [email or web address], cell phone [phone], or land line [phone]. I look forward to a response.   This message was sent via [email or web address] contact us section.

## 2014-03-26 ENCOUNTER — Encounter: Payer: Self-pay | Admitting: Internal Medicine

## 2014-03-26 ENCOUNTER — Ambulatory Visit (INDEPENDENT_AMBULATORY_CARE_PROVIDER_SITE_OTHER): Payer: 59 | Admitting: Internal Medicine

## 2014-03-26 VITALS — BP 132/70 | HR 67 | Temp 97.9°F | Resp 14 | Ht 71.0 in | Wt 235.2 lb

## 2014-03-26 DIAGNOSIS — I10 Essential (primary) hypertension: Secondary | ICD-10-CM

## 2014-03-26 DIAGNOSIS — E785 Hyperlipidemia, unspecified: Secondary | ICD-10-CM

## 2014-03-26 DIAGNOSIS — Z0000 Encounter for general adult medical examination without abnormal findings: Secondary | ICD-10-CM

## 2014-03-26 DIAGNOSIS — Z1211 Encounter for screening for malignant neoplasm of colon: Secondary | ICD-10-CM | POA: Diagnosis not present

## 2014-03-26 DIAGNOSIS — R7301 Impaired fasting glucose: Secondary | ICD-10-CM

## 2014-03-26 LAB — COMPREHENSIVE METABOLIC PANEL
ALBUMIN: 3.9 g/dL (ref 3.5–5.2)
ALT: 61 U/L — ABNORMAL HIGH (ref 0–53)
AST: 38 U/L — ABNORMAL HIGH (ref 0–37)
Alkaline Phosphatase: 59 U/L (ref 39–117)
BUN: 17 mg/dL (ref 6–23)
CHLORIDE: 103 meq/L (ref 96–112)
CO2: 27 meq/L (ref 19–32)
Calcium: 9.4 mg/dL (ref 8.4–10.5)
Creatinine, Ser: 0.9 mg/dL (ref 0.4–1.5)
GFR: 96.83 mL/min (ref >60.00)
GLUCOSE: 118 mg/dL — AB (ref 70–99)
POTASSIUM: 4.5 meq/L (ref 3.5–5.1)
SODIUM: 137 meq/L (ref 135–145)
TOTAL PROTEIN: 7.4 g/dL (ref 6.0–8.3)
Total Bilirubin: 0.5 mg/dL (ref 0.2–1.2)

## 2014-03-26 LAB — CBC WITH DIFFERENTIAL/PLATELET
BASOS ABS: 0 10*3/uL (ref 0.0–0.1)
Basophils Relative: 0.5 % (ref 0.0–3.0)
EOS PCT: 2.2 % (ref 0.0–5.0)
Eosinophils Absolute: 0.1 10*3/uL (ref 0.0–0.7)
HCT: 45.4 % (ref 39.0–52.0)
Hemoglobin: 15.1 g/dL (ref 13.0–17.0)
Lymphocytes Relative: 25.2 % (ref 12.0–46.0)
Lymphs Abs: 1.6 10*3/uL (ref 0.7–4.0)
MCHC: 33.2 g/dL (ref 30.0–36.0)
MCV: 89.1 fl (ref 78.0–100.0)
MONO ABS: 0.7 10*3/uL (ref 0.1–1.0)
MONOS PCT: 10.2 % (ref 3.0–12.0)
NEUTROS PCT: 61.9 % (ref 43.0–77.0)
Neutro Abs: 4 10*3/uL (ref 1.4–7.7)
PLATELETS: 244 10*3/uL (ref 150.0–400.0)
RBC: 5.09 Mil/uL (ref 4.22–5.81)
RDW: 14 % (ref 11.5–15.5)
WBC: 6.5 10*3/uL (ref 4.0–10.5)

## 2014-03-26 LAB — LIPID PANEL
CHOLESTEROL: 209 mg/dL — AB (ref 0–200)
HDL: 49.3 mg/dL (ref >39.00)
LDL Cholesterol: 135 mg/dL — ABNORMAL HIGH (ref 0–99)
NonHDL: 159.7
Total CHOL/HDL Ratio: 4
Triglycerides: 126 mg/dL (ref 0.0–149.0)
VLDL: 25.2 mg/dL (ref 0.0–40.0)

## 2014-03-26 LAB — PSA: PSA: 3.45 ng/mL (ref 0.10–4.00)

## 2014-03-26 LAB — T4, FREE: FREE T4: 0.68 ng/dL (ref 0.60–1.60)

## 2014-03-26 LAB — HEMOGLOBIN A1C: HEMOGLOBIN A1C: 6.3 % (ref 4.6–6.5)

## 2014-03-26 MED ORDER — SILDENAFIL CITRATE 20 MG PO TABS: 60.0000 mg | ORAL_TABLET | Freq: Every day | ORAL | Status: DC | PRN

## 2014-03-26 NOTE — Assessment & Plan Note (Signed)
Due for PSA--after discussion UTD otherwise Will get flu shot at his store

## 2014-03-26 NOTE — Progress Notes (Signed)
Subjective:    Patient ID: Ricardo Reese, male    DOB: [birth year], 63 y.o.   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 Ascorbic Acid (VITAMIN C) 1000 MG tablet Take 1,000 mg by mouth daily.      Marland Kitchen aspirin 81 MG tablet Take 81 mg by mouth daily.      . B Complex-C (SUPER B COMPLEX PO) Take by mouth daily.      . fish oil-omega-3 fatty acids 1000 MG capsule Take 1 g by mouth daily.      Marland Kitchen losartan-hydrochlorothiazide (HYZAAR) 50-12.5 MG per tablet Take 1 tablet by mouth daily.  90 tablet  0  . pravastatin (PRAVACHOL) 40 MG tablet Take 1 tablet (40 mg total) by mouth daily.  90 tablet  0   No current facility-administered medications on file prior to visit.    No Known Allergies  Past Medical History  Diagnosis Date  . Hyperlipidemia   . Hypertension   . Impaired fasting glucose   . Melanoma of back 2013    Dr Evorn Gong  . Kidney stone 1983  . Cancer     basal cell- face    Past Surgical History  Procedure Laterality Date  . Knee surgery  1969    cartilage removal right knee   . Melanoma back  09/2010    Family History  Problem Relation Age of Onset  . Stroke Father   . Diabetes Neg Hx   . Colon cancer Neg Hx   . Stomach cancer Neg Hx   . Esophageal cancer Neg Hx   . Rectal cancer Neg Hx     History   Social History  . Marital Status: Married    Spouse Name: N/A    Number of Children: 2  . Years of Education: N/A   Occupational History  . 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 Smoking status: Never Smoker   . Smokeless tobacco: Never Used  . Alcohol Use: Yes     Comment:  occasional beer  . Drug Use: No  . Sexual Activity: Not on file   Other Topics Concern  . Not on file   Social History Narrative  . No narrative on file   Review of Systems  Constitutional: Negative for fatigue and unexpected weight change.       Wears seat belt  HENT: Negative for dental problem, hearing loss and tinnitus.        Regular with dentist  Eyes: Negative for visual disturbance.       No diplopia or unilateral vision loss  Respiratory: Negative for cough, chest tightness and shortness of breath.   Cardiovascular: Negative for chest pain, palpitations and leg swelling.  Gastrointestinal: Negative for nausea, vomiting, abdominal pain, constipation and blood in stool.       No heartburn  Endocrine: Negative for polydipsia and polyuria.  Genitourinary: Negative for urgency, frequency and difficulty urinating.       Intermittent ED--may not be able maintain erection  Musculoskeletal: Negative for arthralgias, back pain and joint swelling.  Skin: Negative for rash.       Sees Dr  Dasher regularly  Allergic/Immunologic: Negative for environmental allergies and immunocompromised state.  Neurological: Negative for dizziness, syncope, weakness, light-headedness, numbness and headaches.  Hematological: Negative for adenopathy. Does not bruise/bleed easily.  Psychiatric/Behavioral: Negative for sleep disturbance and dysphoric mood. The patient is not nervous/anxious.        Objective:   Physical Exam  Constitutional: He is oriented to person, place, and time. He appears well-developed and well-nourished. No distress.  HENT:  Head: Normocephalic and atraumatic.  Right Ear: External ear normal.  Left Ear: External ear normal.  Mouth/Throat: Oropharynx is clear and moist. No oropharyngeal exudate.  Eyes: Conjunctivae and EOM are normal. Pupils are equal, round, and reactive to light.  Neck: Normal range of motion. Neck supple. No thyromegaly present.  Cardiovascular: Normal  rate, regular rhythm, normal heart sounds and intact distal pulses.  Exam reveals no gallop.   No murmur heard. Pulmonary/Chest: Effort normal and breath sounds normal. No respiratory distress. He has no wheezes. He has no rales.  Abdominal: Soft. He exhibits no distension. There is no tenderness. There is no rebound and no guarding.  Musculoskeletal: He exhibits no edema and no tenderness.  Lymphadenopathy:    He has no cervical adenopathy.  Neurological: He is alert and oriented to person, place, and time.  Skin: No rash noted.  Multiple nevi  Psychiatric: He has a normal mood and affect. His behavior is normal.          Assessment & Plan:

## 2014-03-26 NOTE — Progress Notes (Signed)
Pre visit review using our clinic review tool, if applicable. No additional management support is needed unless otherwise documented below in the visit note. 

## 2014-03-26 NOTE — Assessment & Plan Note (Signed)
No problems with the statin Will check labs

## 2014-03-26 NOTE — Assessment & Plan Note (Signed)
BP Readings from Last 3 Encounters:  03/26/14 132/70  01/04/13 136/72  11/03/11 182/92   Good control Due for labs

## 2014-03-26 NOTE — Assessment & Plan Note (Signed)
Discussed fitness and weight loss Will check labs

## 2014-03-26 NOTE — Patient Instructions (Signed)
DASH Eating Plan °DASH stands for "Dietary Approaches to Stop Hypertension." The DASH eating plan is a healthy eating plan that has been shown to reduce high blood pressure (hypertension). Additional health benefits may include reducing the risk of type 2 diabetes mellitus, heart disease, and stroke. The DASH eating plan may also help with weight loss. °WHAT DO I NEED TO KNOW ABOUT THE DASH EATING PLAN? °For the DASH eating plan, you will follow these general guidelines: °· Choose foods with a percent daily value for sodium of less than 5% (as listed on the food label). °· Use salt-free seasonings or herbs instead of table salt or sea salt. °· Check with your health care provider or pharmacist before using salt substitutes. °· Eat lower-sodium products, often labeled as "lower sodium" or "no salt added." °· Eat fresh foods. °· Eat more vegetables, fruits, and low-fat dairy products. °· Choose whole grains. Look for the word "whole" as the first word in the ingredient list. °· Choose fish and skinless chicken or turkey more often than red meat. Limit fish, poultry, and meat to 6 oz (170 g) each day. °· Limit sweets, desserts, sugars, and sugary drinks. °· Choose heart-healthy fats. °· Limit cheese to 1 oz (28 g) per day. °· Eat more home-cooked food and less restaurant, buffet, and fast food. °· Limit fried foods. °· Cook foods using methods other than frying. °· Limit canned vegetables. If you do use them, rinse them well to decrease the sodium. °· When eating at a restaurant, ask that your food be prepared with less salt, or no salt if possible. °WHAT FOODS CAN I EAT? °Seek help from a dietitian for individual calorie needs. °Grains °Whole grain or whole wheat bread. Brown rice. Whole grain or whole wheat pasta. Quinoa, bulgur, and whole grain cereals. Low-sodium cereals. Corn or whole wheat flour tortillas. Whole grain cornbread. Whole grain crackers. Low-sodium crackers. °Vegetables °Fresh or frozen vegetables  (raw, steamed, roasted, or grilled). Low-sodium or reduced-sodium tomato and vegetable juices. Low-sodium or reduced-sodium tomato sauce and paste. Low-sodium or reduced-sodium canned vegetables.  °Fruits °All fresh, canned (in natural juice), or frozen fruits. °Meat and Other Protein Products °Ground beef (85% or leaner), grass-fed beef, or beef trimmed of fat. Skinless chicken or turkey. Ground chicken or turkey. Pork trimmed of fat. All fish and seafood. Eggs. Dried beans, peas, or lentils. Unsalted nuts and seeds. Unsalted canned beans. °Dairy °Low-fat dairy products, such as skim or 1% milk, 2% or reduced-fat cheeses, low-fat ricotta or cottage cheese, or plain low-fat yogurt. Low-sodium or reduced-sodium cheeses. °Fats and Oils °Tub margarines without trans fats. Light or reduced-fat mayonnaise and salad dressings (reduced sodium). Avocado. Safflower, olive, or canola oils. Natural peanut or almond butter. °Other °Unsalted popcorn and pretzels. °The items listed above may not be a complete list of recommended foods or beverages. Contact your dietitian for more options. °WHAT FOODS ARE NOT RECOMMENDED? °Grains °White bread. White pasta. White rice. Refined cornbread. Bagels and croissants. Crackers that contain trans fat. °Vegetables °Creamed or fried vegetables. Vegetables in a cheese sauce. Regular canned vegetables. Regular canned tomato sauce and paste. Regular tomato and vegetable juices. °Fruits °Dried fruits. Canned fruit in light or heavy syrup. Fruit juice. °Meat and Other Protein Products °Fatty cuts of meat. Ribs, chicken wings, bacon, sausage, bologna, salami, chitterlings, fatback, hot dogs, bratwurst, and packaged luncheon meats. Salted nuts and seeds. Canned beans with salt. °Dairy °Whole or 2% milk, cream, half-and-half, and cream cheese. Whole-fat or sweetened yogurt. Full-fat   cheeses or blue cheese. Nondairy creamers and whipped toppings. Processed cheese, cheese spreads, or cheese  curds. °Condiments °Onion and garlic salt, seasoned salt, table salt, and sea salt. Canned and packaged gravies. Worcestershire sauce. Tartar sauce. Barbecue sauce. Teriyaki sauce. Soy sauce, including reduced sodium. Steak sauce. Fish sauce. Oyster sauce. Cocktail sauce. Horseradish. Ketchup and mustard. Meat flavorings and tenderizers. Bouillon cubes. Hot sauce. Tabasco sauce. Marinades. Taco seasonings. Relishes. °Fats and Oils °Butter, stick margarine, lard, shortening, ghee, and bacon fat. Coconut, palm kernel, or palm oils. Regular salad dressings. °Other °Pickles and olives. Salted popcorn and pretzels. °The items listed above may not be a complete list of foods and beverages to avoid. Contact your dietitian for more information. °WHERE CAN I FIND MORE INFORMATION? °National Heart, Lung, and Blood Institute: www.nhlbi.nih.gov/health/health-topics/topics/dash/ °Document Released: 05/19/2011 Document Revised: 10/14/2013 Document Reviewed: 04/03/2013 °ExitCare® Patient Information ©2015 ExitCare, LLC. This information is not intended to replace advice given to you by your health care provider. Make sure you discuss any questions you have with your health care provider. ° °

## 2014-03-27 ENCOUNTER — Telehealth: Payer: Self-pay | Admitting: Internal Medicine

## 2014-03-27 NOTE — Telephone Encounter (Signed)
emmi emailed °

## 2014-05-26 ENCOUNTER — Other Ambulatory Visit: Payer: Self-pay | Admitting: Internal Medicine

## 2015-03-31 ENCOUNTER — Encounter: Payer: 59 | Admitting: Internal Medicine

## 2015-04-02 DIAGNOSIS — Z0289 Encounter for other administrative examinations: Secondary | ICD-10-CM

## 2015-04-03 ENCOUNTER — Encounter: Payer: Self-pay | Admitting: Internal Medicine

## 2015-04-03 ENCOUNTER — Telehealth: Payer: Self-pay | Admitting: Internal Medicine

## 2015-04-03 NOTE — Telephone Encounter (Signed)
Had been working in [place] so I am not sure how much time he spends up here now. If he still lives here, set him up for PE when he thinks he will be around

## 2015-04-03 NOTE — Telephone Encounter (Signed)
Pt did not come in for their appt today for cpe. Please let me know if pt needs to be contacted immediately for follow up or no follow up needed. Best phone number to contact pt is [phone].

## 2015-04-24 NOTE — Telephone Encounter (Signed)
Left voicemail for patient to call back and reschedule appt.

## 2015-06-02 ENCOUNTER — Other Ambulatory Visit: Payer: Self-pay | Admitting: Internal Medicine

## 2015-06-02 NOTE — Telephone Encounter (Addendum)
[facility] from [facility] left v/m requesting refill for BP meds that have already been requested and denied; pt has CPX scheduled for 12/23/15; [facility] wants to know if refills can be done.

## 2015-06-02 NOTE — Telephone Encounter (Signed)
I didn't see he had an appt in July, pt has canceled all of his appts in 2016, 03/31/15, 04/03/2015 & 06/11/15, we could have gotten him in sooner than july for a physical, last seen 03/26/2014

## 2015-06-03 MED ORDER — PRAVASTATIN SODIUM 40 MG PO TABS: 40.0000 mg | ORAL_TABLET | Freq: Every day | ORAL | Status: DC

## 2015-06-03 MED ORDER — LOSARTAN POTASSIUM-HCTZ 50-12.5 MG PO TABS: 1.0000 | ORAL_TABLET | Freq: Every day | ORAL | Status: DC

## 2015-06-03 NOTE — Addendum Note (Signed)
Addended by: Despina Hidden on: 06/03/2015 09:00 AM   Modules accepted: Orders

## 2015-06-03 NOTE — Telephone Encounter (Signed)
.  left message to have patient return my call.  

## 2015-06-03 NOTE — Telephone Encounter (Signed)
Moved pt's appt to feb 23, rx sent to pharmacy by e-script

## 2015-06-03 NOTE — Telephone Encounter (Signed)
Okay to fill #90 x 0 for each Change his appt to within the 90 days and if he cancels that, I cannot refill anymore

## 2015-06-11 ENCOUNTER — Encounter: Payer: Self-pay | Admitting: Internal Medicine

## 2015-08-06 ENCOUNTER — Ambulatory Visit (INDEPENDENT_AMBULATORY_CARE_PROVIDER_SITE_OTHER): Payer: Managed Care, Other (non HMO) | Admitting: Internal Medicine

## 2015-08-06 ENCOUNTER — Encounter: Payer: Self-pay | Admitting: Internal Medicine

## 2015-08-06 VITALS — BP 148/80 | HR 75 | Temp 98.4°F | Ht 69.5 in | Wt 232.8 lb

## 2015-08-06 DIAGNOSIS — R7301 Impaired fasting glucose: Secondary | ICD-10-CM | POA: Diagnosis not present

## 2015-08-06 DIAGNOSIS — E785 Hyperlipidemia, unspecified: Secondary | ICD-10-CM

## 2015-08-06 DIAGNOSIS — Z0000 Encounter for general adult medical examination without abnormal findings: Secondary | ICD-10-CM

## 2015-08-06 DIAGNOSIS — I10 Essential (primary) hypertension: Secondary | ICD-10-CM | POA: Diagnosis not present

## 2015-08-06 MED ORDER — LOSARTAN POTASSIUM-HCTZ 100-25 MG PO TABS: 1.0000 | ORAL_TABLET | Freq: Every day | ORAL | Status: DC

## 2015-08-06 MED ORDER — PRAVASTATIN SODIUM 40 MG PO TABS: 40.0000 mg | ORAL_TABLET | Freq: Every day | ORAL | Status: DC

## 2015-08-06 NOTE — Assessment & Plan Note (Signed)
Generally healthy Needs to work on fitness Defer PSA to next year at least Colon due 2023

## 2015-08-06 NOTE — Assessment & Plan Note (Signed)
Continue statin for primary prevention. 

## 2015-08-06 NOTE — Assessment & Plan Note (Signed)
Will recheck labs 

## 2015-08-06 NOTE — Progress Notes (Signed)
Subjective:    Patient ID: Ricardo Reese, male    DOB: 1950/10/04, 65 y.o.   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 Ascorbic Acid (VITAMIN C) 1000 MG tablet Take 1,000 mg by mouth daily.    Marland Kitchen aspirin 81 MG tablet Take 81 mg by mouth daily.    . B Complex-C (SUPER B COMPLEX PO) Take by mouth daily.    . fish oil-omega-3 fatty acids 1000 MG capsule Take 1 g by mouth daily.    Marland Kitchen losartan-hydrochlorothiazide (HYZAAR) 50-12.5 MG tablet Take 1 tablet by mouth daily. 90 tablet 0  . pravastatin (PRAVACHOL) 40 MG tablet Take 1 tablet (40 mg total) by mouth daily. 90 tablet 0  . sildenafil (REVATIO) 20 MG tablet Take 3-5 tablets (60-100 mg total) by mouth daily as needed. 50 tablet 11   No current facility-administered medications on file prior to visit.    No Known Allergies  Past Medical History  Diagnosis Date  . Hyperlipidemia   . Hypertension   . Impaired fasting glucose   . Melanoma of back [facility]) 2013    Dr Evorn Gong  . Kidney stone 1983  . Cancer ([place])     basal cell- face    Past Surgical History  Procedure Laterality Date  . Knee surgery  1969    cartilage removal right knee   . Melanoma back  09/2010    Family History  Problem Relation Age of Onset  . Stroke Father   . Diabetes Neg Hx   . Colon cancer Neg Hx   . Stomach cancer Neg Hx   . Esophageal cancer Neg Hx   . Rectal cancer Neg Hx   . Heart disease Brother     Social History   Social History  . Marital Status: Married    Spouse  Name: N/A  . Number of Children: 2  . Years of Education: N/A   Occupational History  . 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 Smoking status: Never Smoker   . Smokeless tobacco: Never Used  . Alcohol Use: Yes     Comment: occasional beer  . Drug Use: No  . Sexual Activity: Not on file   Other Topics Concern  . Not on file   Social History Narrative   Review of Systems  Constitutional: Negative for fatigue.       Wears seat belt  HENT: Negative for dental problem, hearing loss and tinnitus.        Keeps up with dentist  Eyes: Negative for visual disturbance.       No diplopia or unilateral vision loss  Respiratory: Negative for cough, chest tightness and shortness of breath.   Cardiovascular: Negative for chest pain, palpitations and leg swelling.  Gastrointestinal: Negative for nausea, vomiting, abdominal pain, constipation and blood in stool.       No heartburn  Endocrine: Negative for polydipsia and polyuria.  Genitourinary: Negative for urgency, frequency  and difficulty urinating.       Hasn't needed the sildenafil  Musculoskeletal: Negative for back pain and joint swelling.       Still some right knee pain  Skin:       Recent derm visit  Allergic/Immunologic: Negative for environmental allergies and immunocompromised state.  Neurological: Negative for dizziness, syncope, weakness, light-headedness and headaches.  Hematological: Negative for adenopathy. Does not bruise/bleed easily.  Psychiatric/Behavioral: Positive for sleep disturbance. Negative for dysphoric mood. The patient is not nervous/anxious.        Objective:   Physical Exam  Constitutional: He is oriented to person, place, and time. He appears well-developed and well-nourished.  HENT:  Head: Normocephalic and atraumatic.  Right Ear: External ear normal.  Left Ear: External ear normal.  Mouth/Throat: Oropharynx is clear and moist.  Eyes: Conjunctivae are normal. Pupils are equal, round,  and reactive to light.  Neck: Normal range of motion. Neck supple. No thyromegaly present.  Cardiovascular: Normal rate, regular rhythm, normal heart sounds and intact distal pulses.  Exam reveals no gallop.   No murmur heard. Pulmonary/Chest: Effort normal and breath sounds normal. No respiratory distress. He has no wheezes. He has no rales.  Abdominal: Soft. There is no tenderness.  Musculoskeletal: He exhibits no edema or tenderness.  Lymphadenopathy:    He has no cervical adenopathy.  Neurological: He is alert and oriented to person, place, and time.  Skin: No rash noted.  Multiple nevi  Psychiatric: He has a normal mood and affect. His behavior is normal.          Assessment & Plan:

## 2015-08-06 NOTE — Progress Notes (Signed)
Pre visit review using our clinic review tool, if applicable. No additional management support is needed unless otherwise documented below in the visit note. 

## 2015-08-06 NOTE — Assessment & Plan Note (Signed)
BP Readings from Last 3 Encounters:  08/06/15 148/80  03/26/14 132/70  01/04/13 136/72   Not great control Was higher when I rechecked Will increase med

## 2015-08-07 LAB — COMPREHENSIVE METABOLIC PANEL
ALBUMIN: 4.7 g/dL (ref 3.5–5.2)
ALK PHOS: 80 U/L (ref 39–117)
ALT: 40 U/L (ref 0–53)
AST: 33 U/L (ref 0–37)
BUN: 21 mg/dL (ref 6–23)
CALCIUM: 10 mg/dL (ref 8.4–10.5)
CHLORIDE: 102 meq/L (ref 96–112)
CO2: 27 mEq/L (ref 19–32)
CREATININE: 0.94 mg/dL (ref 0.40–1.50)
GFR: 85.84 mL/min (ref >60.00)
Glucose, Bld: 99 mg/dL (ref 70–99)
Potassium: 4.1 mEq/L (ref 3.5–5.1)
Sodium: 138 mEq/L (ref 135–145)
Total Bilirubin: 0.4 mg/dL (ref 0.2–1.2)
Total Protein: 7.9 g/dL (ref 6.0–8.3)

## 2015-08-07 LAB — LIPID PANEL
CHOLESTEROL: 195 mg/dL (ref 0–200)
HDL: 63.9 mg/dL (ref >39.00)
LDL CALC: 96 mg/dL (ref 0–99)
NonHDL: 130.61
TRIGLYCERIDES: 173 mg/dL — AB (ref 0.0–149.0)
Total CHOL/HDL Ratio: 3
VLDL: 34.6 mg/dL (ref 0.0–40.0)

## 2015-08-07 LAB — CBC WITH DIFFERENTIAL/PLATELET
Basophils Absolute: 0 10*3/uL (ref 0.0–0.1)
Basophils Relative: 0.4 % (ref 0.0–3.0)
EOS ABS: 0.1 10*3/uL (ref 0.0–0.7)
EOS PCT: 2 % (ref 0.0–5.0)
HCT: 45 % (ref 39.0–52.0)
HEMOGLOBIN: 15.1 g/dL (ref 13.0–17.0)
Lymphocytes Relative: 28.5 % (ref 12.0–46.0)
Lymphs Abs: 2.1 10*3/uL (ref 0.7–4.0)
MCHC: 33.7 g/dL (ref 30.0–36.0)
MCV: 86.4 fl (ref 78.0–100.0)
MONO ABS: 0.8 10*3/uL (ref 0.1–1.0)
Monocytes Relative: 10.1 % (ref 3.0–12.0)
Neutro Abs: 4.4 10*3/uL (ref 1.4–7.7)
Neutrophils Relative %: 59 % (ref 43.0–77.0)
Platelets: 301 10*3/uL (ref 150.0–400.0)
RBC: 5.21 Mil/uL (ref 4.22–5.81)
RDW: 14.5 % (ref 11.5–15.5)
WBC: 7.5 10*3/uL (ref 4.0–10.5)

## 2015-08-07 LAB — HEMOGLOBIN A1C: Hgb A1c MFr Bld: 6.7 % — ABNORMAL HIGH (ref 4.6–6.5)

## 2015-12-23 ENCOUNTER — Encounter: Payer: Self-pay | Admitting: Internal Medicine

## 2016-06-10 ENCOUNTER — Ambulatory Visit (INDEPENDENT_AMBULATORY_CARE_PROVIDER_SITE_OTHER): Payer: Managed Care, Other (non HMO) | Admitting: Internal Medicine

## 2016-06-10 ENCOUNTER — Encounter: Payer: Self-pay | Admitting: Internal Medicine

## 2016-06-10 VITALS — BP 150/82 | HR 68 | Wt 228.0 lb

## 2016-06-10 DIAGNOSIS — M25552 Pain in left hip: Secondary | ICD-10-CM

## 2016-06-10 NOTE — Progress Notes (Signed)
Pre visit review using our clinic review tool, if applicable. No additional management support is needed unless otherwise documented below in the visit note. 

## 2016-06-10 NOTE — Assessment & Plan Note (Signed)
PE findings suggest severe osteoarthritis Will set up with ortho for evaluation

## 2016-06-10 NOTE — Progress Notes (Signed)
   Subjective:    Patient ID: Ricardo Reese, male    DOB: [birth year], 65 y.o.   MRN: [id number]  HPI Here due to hip pain--leftr  Has gone on for a while but not going away Started lightly around 3 months ago Will get throbbing if he walks for a while Pain along entire thigh and down to his shin No tenderness Tight when he first gets up from sitting  Aleve not helping No leg swelling No apparent injury  Current Outpatient Prescriptions on File Prior to Visit  Medication Sig Dispense Refill  . Ascorbic Acid (VITAMIN C) 1000 MG tablet Take 1,000 mg by mouth daily.    Marland Kitchen aspirin 81 MG tablet Take 81 mg by mouth daily.    . B Complex-C (SUPER B COMPLEX PO) Take by mouth daily.    . fish oil-omega-3 fatty acids 1000 MG capsule Take 1 g by mouth daily.    Marland Kitchen losartan-hydrochlorothiazide (HYZAAR) 100-25 MG tablet Take 1 tablet by mouth daily. 90 tablet 3  . pravastatin (PRAVACHOL) 40 MG tablet Take 1 tablet (40 mg total) by mouth daily. 90 tablet 3  . sildenafil (REVATIO) 20 MG tablet Take 3-5 tablets (60-100 mg total) by mouth daily as needed. 50 tablet 11   No current facility-administered medications on file prior to visit.     No Known Allergies  Past Medical History:  Diagnosis Date  . Cancer ([place])    basal cell- face  . Hyperlipidemia   . Hypertension   . Impaired fasting glucose   . Kidney stone 1983  . Melanoma of back [facility]) 2013   Dr Evorn Gong    Past Surgical History:  Procedure Laterality Date  . KNEE SURGERY  1969   cartilage removal right knee   . melanoma back  09/2010    Family History  Problem Relation Age of Onset  . Stroke Father   . Diabetes Neg Hx   . Colon cancer Neg Hx   . Stomach cancer Neg Hx   . Esophageal cancer Neg Hx   . Rectal cancer Neg Hx   . Heart disease Brother     Social History   Social History  . Marital status: Married    Spouse name: N/A  . Number of children: 2  . Years of education: N/A   Occupational History  . Store  Leisure centre manager   Social History Main Topics  . Smoking status: Never Smoker  . Smokeless tobacco: Never Used  . Alcohol use Yes     Comment: occasional beer  . Drug use: No  . Sexual activity: Not on file   Other Topics Concern  . Not on file   Social History Narrative  . No narrative on file   Review of Systems No joint swelling No fever Doesn't feel sick No apparent hernia Retiring very soon    Objective:   Physical Exam  Cardiovascular: Intact distal pulses.   Musculoskeletal:  No spine tenderness No left hip tenderness Almost no internal rotation and limited external rotation and pain with passive ext rotation  Neurological:  Obvious antalgic gait--- like hip No weakness          Assessment & Plan:

## 2016-06-14 ENCOUNTER — Ambulatory Visit: Payer: Managed Care, Other (non HMO) | Admitting: Internal Medicine

## 2016-06-15 ENCOUNTER — Ambulatory Visit: Payer: Managed Care, Other (non HMO) | Admitting: Internal Medicine

## 2016-07-26 ENCOUNTER — Ambulatory Visit (INDEPENDENT_AMBULATORY_CARE_PROVIDER_SITE_OTHER): Payer: Self-pay

## 2016-07-26 ENCOUNTER — Ambulatory Visit (INDEPENDENT_AMBULATORY_CARE_PROVIDER_SITE_OTHER): Payer: BLUE CROSS/BLUE SHIELD | Admitting: Orthopaedic Surgery

## 2016-07-26 ENCOUNTER — Encounter (INDEPENDENT_AMBULATORY_CARE_PROVIDER_SITE_OTHER): Payer: Self-pay | Admitting: Orthopaedic Surgery

## 2016-07-26 VITALS — Ht 69.5 in | Wt 228.0 lb

## 2016-07-26 DIAGNOSIS — M25552 Pain in left hip: Secondary | ICD-10-CM | POA: Diagnosis not present

## 2016-07-26 DIAGNOSIS — M1612 Unilateral primary osteoarthritis, left hip: Secondary | ICD-10-CM | POA: Diagnosis not present

## 2016-07-26 NOTE — Progress Notes (Signed)
Office Visit Note   Patient: Ricardo Reese           Date of Birth: 09/10/1950           MRN: [id number] Visit Date: 07/26/2016              Requested by: Venia Carbon, MD [place], [place] [address] PCP: Viviana Simpler, MD   Assessment & Plan: Visit Diagnoses:  1. Pain in left hip   2. Unilateral primary osteoarthritis, left hip     Plan: At this point we have recommended hip replacement surgery based on his clinical exam his signs and symptoms of hip pain and his x-ray findings. I showed him a hip model expander in detail what hip replacement surgery involves including a thorough discussion of the risks and benefits of the surgery. He is tried and failed all forms conservative treatment at this point. An intra-articular injection would not work based on no joint space is left. I gave him our surgery scheduler's card and my card and said we can answer any questions at a later date if need be. He is otherwise a healthy individual and he would like to think about having this done sometime potentially later this year. He will call us when he gets that point.  Follow-Up Instructions: Return if symptoms worsen or fail to improve.   Orders:  Orders Placed This Encounter  Procedures  . XR HIP UNILAT W OR W/O PELVIS 2-3 VIEWS LEFT   No orders of the defined types were placed in this encounter.     Procedures: No procedures performed   Clinical Data: No additional findings.   Subjective: Chief Complaint  Patient presents with  . Left Hip - Pain    NKI, pain since last September, progressive worsening. Referred by PCP Dr. Silvio Pate. Patient has left groin pain, lateral hip pain, and sometimes has radiating pain down to shin. He has sleep disturbance. Unable to swing golf club. Taking tylenol now.   This is been slowly getting worse in terms of pain in his left hip and groin. Is been hard to get all shoes and socks as well. It's detrimentally affecting his  activities daily living, his quality of life, and his mobility. He is even tried an assistive device and anti-inflammatories as well as activity modification and weight loss. He can be 10 out of 10 at times. HPI  Review of Systems He currently denies any chest pain, shortness of breath, fever, chills, nausea, vomiting, headache  Objective: Vital Signs: Ht 5' 9.5" (1.765 m)   Wt 228 lb (103.4 kg)   BMI 33.19 kg/m   Physical Exam  Constitutional: He is oriented to person, place, and time. He appears well-developed and well-nourished.  HENT:  Head: Normocephalic.  Eyes: EOM are normal. Pupils are equal, round, and reactive to light.  Neck: Normal range of motion. Neck supple.  Cardiovascular: Normal rate and regular rhythm.   Pulmonary/Chest: Effort normal and breath sounds normal.  Abdominal: Soft.  Neurological: He is alert and oriented to person, place, and time.   He is alert and oriented 3. He walks with a limp. He has a slow time getting up from a seated position. Ortho Exam Examination of his left hip shows significant deficits in internal rotation rotation with severe pain in the groin. He is slightly short on his left side compared left and right but is minimal shortening. He has pain over the trochanteric area as well.  Specialty Comments:  No specialty comments available.  Imaging: Xr Hip Unilat W Or W/o Pelvis 2-3 Views Left  Result Date: 07/26/2016 An AP pelvis and lateral of his left hip show flattening of the femoral head. There is no joint space between the femoral head and acetabulum. There sclerotic changes in particular osteophytes showing severe osteoarthritis of his left hip joint    PMFS History: Patient Active Problem List   Diagnosis Date Noted  . Left hip pain 06/10/2016  . Routine general medical examination at a health care facility 08/17/2011  . Melanoma of back ([place])   . Hyperlipemia 05/20/2008  . Benign essential hypertension 05/20/2008  .  IMPAIRED FASTING GLUCOSE 05/20/2008   Past Medical History:  Diagnosis Date  . Cancer ([place])    basal cell- face  . Hyperlipidemia   . Hypertension   . Impaired fasting glucose   . Kidney stone 1983  . Melanoma of back [facility]) 2013   Dr Evorn Gong    Family History  Problem Relation Age of Onset  . Stroke Father   . Heart disease Brother   . Diabetes Neg Hx   . Colon cancer Neg Hx   . Stomach cancer Neg Hx   . Esophageal cancer Neg Hx   . Rectal cancer Neg Hx     Past Surgical History:  Procedure Laterality Date  . KNEE SURGERY  1969   cartilage removal right knee   . melanoma back  09/2010   Social History   Occupational History  . 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 Smoking status: Never Smoker  . Smokeless tobacco: Never Used  . Alcohol use Yes     Comment: occasional beer  . Drug use: No  . Sexual activity: Not on file

## 2016-08-09 ENCOUNTER — Other Ambulatory Visit: Payer: Self-pay

## 2016-08-09 MED ORDER — LOSARTAN POTASSIUM-HCTZ 100-25 MG PO TABS: 1.0000 | ORAL_TABLET | Freq: Every day | ORAL | 0 refills | Status: DC

## 2016-08-09 MED ORDER — PRAVASTATIN SODIUM 40 MG PO TABS: 40.0000 mg | ORAL_TABLET | Freq: Every day | ORAL | 0 refills | Status: DC

## 2016-08-09 NOTE — Telephone Encounter (Signed)
Pt in [place] and running out of med; pt request refill on losartan-HCTZ and pravastatin. Pt has CPX in April. [facility] [place] and pt voiced understanding.

## 2016-08-10 ENCOUNTER — Encounter: Payer: Managed Care, Other (non HMO) | Admitting: Internal Medicine

## 2016-09-09 ENCOUNTER — Other Ambulatory Visit (INDEPENDENT_AMBULATORY_CARE_PROVIDER_SITE_OTHER): Payer: Self-pay | Admitting: Physician Assistant

## 2016-09-14 NOTE — Pre-Procedure Instructions (Signed)
Kamsiyochukwu Spickler Cardozo  09/14/2016      [id number] - [place], [address] [place] [address] Phone: [phone] Fax: [place], [address] [place] [address] Phone: [phone] Fax: [phone]  [id number] [place], [place] [address] Phone: [phone] Fax: [phone]    Your procedure is scheduled on Tuesday, April 10th   Report to [facility] Admitting at 10:10 am             (posted surgery time 12:10pm - 1:34pm)   Call this number if you have problems the [facility] of surgery:  [phone], otherwise call [phone] [place] - Fri 8-4:30   Remember:  Do not eat food or drink liquids after midnight Monday.   Take these medicines the morning of surgery with A SIP OF WATER : NOTHING                         4-5 days prior to surgery, STOP taking your Vitamins, Herbal Supplements, Anti-inflammatories.   Do not wear jewelry - no rings or watches.  Do not wear lotions, colognes or deoderant.              Men may shave face and neck.  Do not bring valuables to the hospital.  [facility] is not responsible for any belongings or valuables.  Contacts, dentures or bridgework may not be worn into surgery.  Leave your suitcase in the car.  After surgery it may be brought to your room.  For patients admitted to the hospital, discharge time will be determined by your treatment team.  Please read over the following fact sheets that you were given. Pain Booklet, MRSA Information and Surgical Site Infection Prevention

## 2016-09-15 ENCOUNTER — Encounter (HOSPITAL_COMMUNITY)
Admission: RE | Admit: 2016-09-15 | Discharge: 2016-09-15 | Disposition: A | Discharge status: Home / Self Care | Payer: Medicare Other | Source: Ambulatory Visit | Attending: Orthopaedic Surgery | Admitting: Orthopaedic Surgery

## 2016-09-15 ENCOUNTER — Encounter (HOSPITAL_COMMUNITY): Payer: Self-pay

## 2016-09-15 DIAGNOSIS — M1612 Unilateral primary osteoarthritis, left hip: Secondary | ICD-10-CM | POA: Diagnosis not present

## 2016-09-15 DIAGNOSIS — Z01812 Encounter for preprocedural laboratory examination: Secondary | ICD-10-CM | POA: Diagnosis not present

## 2016-09-15 DIAGNOSIS — Z01810 Encounter for preprocedural cardiovascular examination: Secondary | ICD-10-CM | POA: Diagnosis not present

## 2016-09-15 HISTORY — DX: Personal history of urinary calculi: Z87.442

## 2016-09-15 HISTORY — DX: Pneumonia, unspecified organism: J18.9

## 2016-09-15 LAB — SURGICAL PCR SCREEN
MRSA, PCR: NEGATIVE
STAPHYLOCOCCUS AUREUS: NEGATIVE

## 2016-09-15 LAB — CBC
HCT: 43.2 % (ref 39.0–52.0)
Hemoglobin: 14.5 g/dL (ref 13.0–17.0)
MCH: 29.5 pg (ref 26.0–34.0)
MCHC: 33.6 g/dL (ref 30.0–36.0)
MCV: 87.8 fL (ref 78.0–100.0)
Platelets: 271 10*3/uL (ref 150–400)
RBC: 4.92 MIL/uL (ref 4.22–5.81)
RDW: 13.9 % (ref 11.5–15.5)
WBC: 8.9 10*3/uL (ref 4.0–10.5)

## 2016-09-15 LAB — BASIC METABOLIC PANEL
Anion gap: 9 (ref 5–15)
BUN: 21 mg/dL — ABNORMAL HIGH (ref 6–20)
CALCIUM: 9.5 mg/dL (ref 8.9–10.3)
CO2: 24 mmol/L (ref 22–32)
CREATININE: 0.85 mg/dL (ref 0.61–1.24)
Chloride: 104 mmol/L (ref 101–111)
GFR calc non Af Amer: >60 mL/min (ref >60)
GLUCOSE: 107 mg/dL — AB (ref 65–99)
Potassium: 4.5 mmol/L (ref 3.5–5.1)
Sodium: 137 mmol/L (ref 135–145)

## 2016-09-15 NOTE — Progress Notes (Signed)
PCP is Dr. Viviana Simpler  LOV 06/2016 Denies any cardiac issues, has not been to see a cardioloist

## 2016-09-19 MED ORDER — TRANEXAMIC ACID 1000 MG/10ML IV SOLN
1000.0000 mg | INTRAVENOUS | Status: AC
  Administered 2016-09-20: 1000 mg via INTRAVENOUS
  Filled 2016-09-19: qty 10

## 2016-09-19 MED ORDER — DEXTROSE 5 % IV SOLN
3.0000 g | INTRAVENOUS | Status: AC
  Administered 2016-09-20: 3 g via INTRAVENOUS
  Filled 2016-09-19: qty 3000

## 2016-09-20 ENCOUNTER — Encounter (HOSPITAL_COMMUNITY): Payer: Self-pay | Admitting: *Deleted

## 2016-09-20 ENCOUNTER — Inpatient Hospital Stay (HOSPITAL_COMMUNITY): Payer: Medicare Other | Admitting: Anesthesiology

## 2016-09-20 ENCOUNTER — Inpatient Hospital Stay (HOSPITAL_COMMUNITY): Payer: Medicare Other

## 2016-09-20 ENCOUNTER — Encounter (HOSPITAL_COMMUNITY)
Admission: RE | Disposition: A | Discharge status: Home / Self Care | Payer: Self-pay | Source: Ambulatory Visit | Attending: Orthopaedic Surgery

## 2016-09-20 ENCOUNTER — Inpatient Hospital Stay (HOSPITAL_COMMUNITY)
Admission: RE | Admit: 2016-09-20 | Discharge: 2016-09-22 | DRG: 470 | Disposition: A | Discharge status: Home / Self Care | Payer: Medicare Other | Source: Ambulatory Visit | Attending: Orthopaedic Surgery | Admitting: Orthopaedic Surgery

## 2016-09-20 DIAGNOSIS — Z87442 Personal history of urinary calculi: Secondary | ICD-10-CM

## 2016-09-20 DIAGNOSIS — Z85820 Personal history of malignant melanoma of skin: Secondary | ICD-10-CM

## 2016-09-20 DIAGNOSIS — M1612 Unilateral primary osteoarthritis, left hip: Secondary | ICD-10-CM | POA: Diagnosis not present

## 2016-09-20 DIAGNOSIS — Z96642 Presence of left artificial hip joint: Secondary | ICD-10-CM | POA: Diagnosis not present

## 2016-09-20 DIAGNOSIS — I10 Essential (primary) hypertension: Secondary | ICD-10-CM | POA: Diagnosis present

## 2016-09-20 DIAGNOSIS — Z7982 Long term (current) use of aspirin: Secondary | ICD-10-CM

## 2016-09-20 DIAGNOSIS — E785 Hyperlipidemia, unspecified: Secondary | ICD-10-CM | POA: Diagnosis present

## 2016-09-20 DIAGNOSIS — Z79899 Other long term (current) drug therapy: Secondary | ICD-10-CM

## 2016-09-20 DIAGNOSIS — Z8249 Family history of ischemic heart disease and other diseases of the circulatory system: Secondary | ICD-10-CM | POA: Diagnosis not present

## 2016-09-20 DIAGNOSIS — Z823 Family history of stroke: Secondary | ICD-10-CM

## 2016-09-20 DIAGNOSIS — Z419 Encounter for procedure for purposes other than remedying health state, unspecified: Secondary | ICD-10-CM

## 2016-09-20 DIAGNOSIS — Z471 Aftercare following joint replacement surgery: Secondary | ICD-10-CM | POA: Diagnosis not present

## 2016-09-20 HISTORY — PX: TOTAL HIP ARTHROPLASTY: SHX124

## 2016-09-20 SURGERY — ARTHROPLASTY, HIP, TOTAL, ANTERIOR APPROACH
Anesthesia: Spinal | Site: Hip | Laterality: Left

## 2016-09-20 MED ORDER — LACTATED RINGERS IV SOLN
INTRAVENOUS | Status: DC
  Administered 2016-09-20 (×2): via INTRAVENOUS

## 2016-09-20 MED ORDER — BUPIVACAINE HCL (PF) 0.75 % IJ SOLN
INTRAMUSCULAR | Status: DC | PRN
  Administered 2016-09-20: 2 mL via INTRATHECAL

## 2016-09-20 MED ORDER — KETOROLAC TROMETHAMINE 15 MG/ML IJ SOLN
7.5000 mg | Freq: Four times a day (QID) | INTRAMUSCULAR | Status: AC
  Administered 2016-09-20 – 2016-09-21 (×4): 7.5 mg via INTRAVENOUS
  Filled 2016-09-20 (×4): qty 1

## 2016-09-20 MED ORDER — HYDROCHLOROTHIAZIDE 25 MG PO TABS
25.0000 mg | ORAL_TABLET | Freq: Every day | ORAL | Status: DC
  Administered 2016-09-20 – 2016-09-22 (×3): 25 mg via ORAL
  Filled 2016-09-20 (×3): qty 1

## 2016-09-20 MED ORDER — OXYCODONE HCL 5 MG PO TABS
5.0000 mg | ORAL_TABLET | ORAL | Status: DC | PRN
  Administered 2016-09-20 – 2016-09-21 (×5): 10 mg via ORAL
  Filled 2016-09-20 (×5): qty 2

## 2016-09-20 MED ORDER — ONDANSETRON HCL 4 MG/2ML IJ SOLN: 4.0000 mg | Freq: Four times a day (QID) | INTRAMUSCULAR | Status: DC | PRN

## 2016-09-20 MED ORDER — MIDAZOLAM HCL 2 MG/2ML IJ SOLN
INTRAMUSCULAR | Status: AC
  Filled 2016-09-20: qty 2

## 2016-09-20 MED ORDER — HYDROMORPHONE HCL 1 MG/ML IJ SOLN: 1.0000 mg | INTRAMUSCULAR | Status: DC | PRN

## 2016-09-20 MED ORDER — SODIUM CHLORIDE 0.9 % IV SOLN
INTRAVENOUS | Status: DC
  Administered 2016-09-20: 18:00:00 via INTRAVENOUS

## 2016-09-20 MED ORDER — ONDANSETRON HCL 4 MG PO TABS: 4.0000 mg | ORAL_TABLET | Freq: Four times a day (QID) | ORAL | Status: DC | PRN

## 2016-09-20 MED ORDER — ONDANSETRON HCL 4 MG/2ML IJ SOLN
INTRAMUSCULAR | Status: DC | PRN
  Administered 2016-09-20: 4 mg via INTRAVENOUS

## 2016-09-20 MED ORDER — PHENYLEPHRINE HCL 10 MG/ML IJ SOLN
INTRAMUSCULAR | Status: DC | PRN
  Administered 2016-09-20: 40 ug via INTRAVENOUS
  Administered 2016-09-20 (×2): 80 ug via INTRAVENOUS
  Administered 2016-09-20 (×2): 40 ug via INTRAVENOUS
  Administered 2016-09-20: 80 ug via INTRAVENOUS
  Administered 2016-09-20: 40 ug via INTRAVENOUS

## 2016-09-20 MED ORDER — ACETAMINOPHEN 325 MG PO TABS
650.0000 mg | ORAL_TABLET | Freq: Four times a day (QID) | ORAL | Status: DC | PRN
  Administered 2016-09-22: 650 mg via ORAL
  Filled 2016-09-20: qty 2

## 2016-09-20 MED ORDER — METOCLOPRAMIDE HCL 5 MG/ML IJ SOLN: 5.0000 mg | Freq: Three times a day (TID) | INTRAMUSCULAR | Status: DC | PRN

## 2016-09-20 MED ORDER — PROPOFOL 10 MG/ML IV BOLUS
INTRAVENOUS | Status: AC
  Filled 2016-09-20: qty 20

## 2016-09-20 MED ORDER — LOSARTAN POTASSIUM-HCTZ 100-25 MG PO TABS: 1.0000 | ORAL_TABLET | Freq: Every day | ORAL | Status: DC

## 2016-09-20 MED ORDER — LIDOCAINE 2% (20 MG/ML) 5 ML SYRINGE
INTRAMUSCULAR | Status: AC
  Filled 2016-09-20: qty 5

## 2016-09-20 MED ORDER — CHLORHEXIDINE GLUCONATE 4 % EX LIQD: 60.0000 mL | Freq: Once | CUTANEOUS | Status: DC

## 2016-09-20 MED ORDER — LOSARTAN POTASSIUM 50 MG PO TABS
100.0000 mg | ORAL_TABLET | Freq: Every day | ORAL | Status: DC
  Administered 2016-09-20 – 2016-09-22 (×3): 100 mg via ORAL
  Filled 2016-09-20 (×3): qty 2

## 2016-09-20 MED ORDER — PRAVASTATIN SODIUM 40 MG PO TABS
40.0000 mg | ORAL_TABLET | Freq: Every day | ORAL | Status: DC
  Administered 2016-09-20 – 2016-09-21 (×2): 40 mg via ORAL
  Filled 2016-09-20 (×2): qty 1

## 2016-09-20 MED ORDER — METHOCARBAMOL 1000 MG/10ML IJ SOLN
500.0000 mg | Freq: Four times a day (QID) | INTRAVENOUS | Status: DC | PRN
  Filled 2016-09-20: qty 5

## 2016-09-20 MED ORDER — DOCUSATE SODIUM 100 MG PO CAPS
100.0000 mg | ORAL_CAPSULE | Freq: Two times a day (BID) | ORAL | Status: DC
  Administered 2016-09-20 – 2016-09-22 (×4): 100 mg via ORAL
  Filled 2016-09-20 (×4): qty 1

## 2016-09-20 MED ORDER — CEFAZOLIN IN D5W 1 GM/50ML IV SOLN
1.0000 g | Freq: Four times a day (QID) | INTRAVENOUS | Status: AC
  Administered 2016-09-20 – 2016-09-21 (×2): 1 g via INTRAVENOUS
  Filled 2016-09-20 (×3): qty 50

## 2016-09-20 MED ORDER — ACETAMINOPHEN 650 MG RE SUPP: 650.0000 mg | Freq: Four times a day (QID) | RECTAL | Status: DC | PRN

## 2016-09-20 MED ORDER — MENTHOL 3 MG MT LOZG: 1.0000 | LOZENGE | OROMUCOSAL | Status: DC | PRN

## 2016-09-20 MED ORDER — PROPOFOL 500 MG/50ML IV EMUL
INTRAVENOUS | Status: DC | PRN
  Administered 2016-09-20: 75 ug/kg/min via INTRAVENOUS
  Administered 2016-09-20: 13:00:00 via INTRAVENOUS

## 2016-09-20 MED ORDER — DEXAMETHASONE SODIUM PHOSPHATE 10 MG/ML IJ SOLN
INTRAMUSCULAR | Status: AC
  Filled 2016-09-20: qty 1

## 2016-09-20 MED ORDER — METOCLOPRAMIDE HCL 5 MG PO TABS: 5.0000 mg | ORAL_TABLET | Freq: Three times a day (TID) | ORAL | Status: DC | PRN

## 2016-09-20 MED ORDER — METHOCARBAMOL 500 MG PO TABS
500.0000 mg | ORAL_TABLET | Freq: Four times a day (QID) | ORAL | Status: DC | PRN
  Administered 2016-09-20: 500 mg via ORAL
  Filled 2016-09-20: qty 1

## 2016-09-20 MED ORDER — FENTANYL CITRATE (PF) 100 MCG/2ML IJ SOLN
INTRAMUSCULAR | Status: DC | PRN
  Administered 2016-09-20: 100 ug via INTRAVENOUS

## 2016-09-20 MED ORDER — PHENYLEPHRINE 40 MCG/ML (10ML) SYRINGE FOR IV PUSH (FOR BLOOD PRESSURE SUPPORT)
PREFILLED_SYRINGE | INTRAVENOUS | Status: AC
  Filled 2016-09-20: qty 10

## 2016-09-20 MED ORDER — SODIUM CHLORIDE 0.9 % IR SOLN
Status: DC | PRN
  Administered 2016-09-20: 3000 mL

## 2016-09-20 MED ORDER — ONDANSETRON HCL 4 MG/2ML IJ SOLN
INTRAMUSCULAR | Status: AC
  Filled 2016-09-20: qty 2

## 2016-09-20 MED ORDER — 0.9 % SODIUM CHLORIDE (POUR BTL) OPTIME
TOPICAL | Status: DC | PRN
  Administered 2016-09-20: 1000 mL

## 2016-09-20 MED ORDER — DIPHENHYDRAMINE HCL 12.5 MG/5ML PO ELIX: 12.5000 mg | ORAL_SOLUTION | ORAL | Status: DC | PRN

## 2016-09-20 MED ORDER — POLYETHYLENE GLYCOL 3350 17 G PO PACK: 17.0000 g | PACK | Freq: Every day | ORAL | Status: DC | PRN

## 2016-09-20 MED ORDER — ALUM & MAG HYDROXIDE-SIMETH 200-200-20 MG/5ML PO SUSP: 30.0000 mL | ORAL | Status: DC | PRN

## 2016-09-20 MED ORDER — ASPIRIN 81 MG PO CHEW
81.0000 mg | CHEWABLE_TABLET | Freq: Two times a day (BID) | ORAL | Status: DC
  Administered 2016-09-20 – 2016-09-22 (×4): 81 mg via ORAL
  Filled 2016-09-20 (×4): qty 1

## 2016-09-20 MED ORDER — PHENOL 1.4 % MT LIQD: 1.0000 | OROMUCOSAL | Status: DC | PRN

## 2016-09-20 MED ORDER — MIDAZOLAM HCL 5 MG/5ML IJ SOLN
INTRAMUSCULAR | Status: DC | PRN
  Administered 2016-09-20: 2 mg via INTRAVENOUS

## 2016-09-20 MED ORDER — DEXAMETHASONE SODIUM PHOSPHATE 10 MG/ML IJ SOLN
INTRAMUSCULAR | Status: DC | PRN
  Administered 2016-09-20: 10 mg via INTRAVENOUS

## 2016-09-20 MED ORDER — FENTANYL CITRATE (PF) 250 MCG/5ML IJ SOLN
INTRAMUSCULAR | Status: AC
  Filled 2016-09-20: qty 5

## 2016-09-20 SURGICAL SUPPLY — 53 items
BENZOIN TINCTURE PRP APPL 2/3 (GAUZE/BANDAGES/DRESSINGS) ×3 IMPLANT
BLADE CLIPPER SURG (BLADE) IMPLANT
BLADE SAW SGTL 18X1.27X75 (BLADE) ×2 IMPLANT
BLADE SAW SGTL 18X1.27X75MM (BLADE) ×1
CAPT HIP TOTAL 2 ×3 IMPLANT
CELLS DAT CNTRL 66122 CELL SVR (MISCELLANEOUS) ×1 IMPLANT
CLOSURE STERI-STRIP 1/2X4 (GAUZE/BANDAGES/DRESSINGS) ×1
CLOSURE WOUND 1/2 X4 (GAUZE/BANDAGES/DRESSINGS) ×2
CLSR STERI-STRIP ANTIMIC 1/2X4 (GAUZE/BANDAGES/DRESSINGS) ×2 IMPLANT
COVER SURGICAL LIGHT HANDLE (MISCELLANEOUS) ×3 IMPLANT
DRAPE C-ARM 42X72 X-RAY (DRAPES) ×3 IMPLANT
DRAPE STERI IOBAN 125X83 (DRAPES) ×3 IMPLANT
DRAPE U-SHAPE 47X51 STRL (DRAPES) ×9 IMPLANT
DRSG AQUACEL AG ADV 3.5X10 (GAUZE/BANDAGES/DRESSINGS) ×3 IMPLANT
DURAPREP 26ML APPLICATOR (WOUND CARE) ×3 IMPLANT
ELECT BLADE 4.0 EZ CLEAN MEGAD (MISCELLANEOUS) ×3
ELECT BLADE 6.5 EXT (BLADE) IMPLANT
ELECT REM PT RETURN 9FT ADLT (ELECTROSURGICAL) ×3
ELECTRODE BLDE 4.0 EZ CLN MEGD (MISCELLANEOUS) ×1 IMPLANT
ELECTRODE REM PT RTRN 9FT ADLT (ELECTROSURGICAL) ×1 IMPLANT
FACESHIELD WRAPAROUND (MASK) ×6 IMPLANT
GLOVE BIOGEL PI IND STRL 8 (GLOVE) ×2 IMPLANT
GLOVE BIOGEL PI INDICATOR 8 (GLOVE) ×4
GLOVE ECLIPSE 8.0 STRL XLNG CF (GLOVE) ×3 IMPLANT
GLOVE ORTHO TXT STRL SZ7.5 (GLOVE) ×6 IMPLANT
GOWN STRL REUS W/ TWL LRG LVL3 (GOWN DISPOSABLE) ×2 IMPLANT
GOWN STRL REUS W/ TWL XL LVL3 (GOWN DISPOSABLE) ×2 IMPLANT
GOWN STRL REUS W/TWL LRG LVL3 (GOWN DISPOSABLE) ×4
GOWN STRL REUS W/TWL XL LVL3 (GOWN DISPOSABLE) ×4
HANDPIECE INTERPULSE COAX TIP (DISPOSABLE) ×2
KIT BASIN OR (CUSTOM PROCEDURE TRAY) ×3 IMPLANT
KIT ROOM TURNOVER OR (KITS) ×3 IMPLANT
MANIFOLD NEPTUNE II (INSTRUMENTS) ×3 IMPLANT
NS IRRIG 1000ML POUR BTL (IV SOLUTION) ×3 IMPLANT
PACK TOTAL JOINT (CUSTOM PROCEDURE TRAY) ×3 IMPLANT
PAD ARMBOARD 7.5X6 YLW CONV (MISCELLANEOUS) ×3 IMPLANT
RTRCTR WOUND ALEXIS 18CM MED (MISCELLANEOUS) ×3
SET HNDPC FAN SPRY TIP SCT (DISPOSABLE) ×1 IMPLANT
STAPLER VISISTAT 35W (STAPLE) IMPLANT
STRIP CLOSURE SKIN 1/2X4 (GAUZE/BANDAGES/DRESSINGS) ×4 IMPLANT
SUT ETHIBOND NAB CT1 #1 30IN (SUTURE) ×3 IMPLANT
SUT MNCRL AB 4-0 PS2 18 (SUTURE) IMPLANT
SUT VIC AB 0 CT1 27 (SUTURE) ×2
SUT VIC AB 0 CT1 27XBRD ANBCTR (SUTURE) ×1 IMPLANT
SUT VIC AB 1 CT1 27 (SUTURE) ×2
SUT VIC AB 1 CT1 27XBRD ANBCTR (SUTURE) ×1 IMPLANT
SUT VIC AB 2-0 CT1 27 (SUTURE) ×2
SUT VIC AB 2-0 CT1 TAPERPNT 27 (SUTURE) ×1 IMPLANT
TOWEL OR 17X24 6PK STRL BLUE (TOWEL DISPOSABLE) ×3 IMPLANT
TOWEL OR 17X26 10 PK STRL BLUE (TOWEL DISPOSABLE) ×3 IMPLANT
TRAY CATH 16FR W/PLASTIC CATH (SET/KITS/TRAYS/PACK) IMPLANT
TRAY FOLEY W/METER SILVER 16FR (SET/KITS/TRAYS/PACK) IMPLANT
WATER STERILE IRR 1000ML POUR (IV SOLUTION) IMPLANT

## 2016-09-20 NOTE — H&P (Signed)
TOTAL HIP ADMISSION H&P  Patient is admitted for left total hip arthroplasty.  Subjective:  Chief Complaint: left hip pain  HPI: Ricardo Reese, 66 y.o. male, has a history of pain and functional disability in the left hip(s) due to arthritis and patient has failed non-surgical conservative treatments for greater than 12 weeks to include NSAID's and/or analgesics, use of assistive devices, weight reduction as appropriate and activity modification.  Onset of symptoms was gradual starting 2 years ago with rapidlly worsening course since that time.The patient noted no past surgery on the left hip(s).  Patient currently rates pain in the left hip at 10 out of 10 with activity. Patient has night pain, worsening of pain with activity and weight bearing, trendelenberg gait, pain that interfers with activities of daily living, pain with passive range of motion and crepitus. Patient has evidence of subchondral cysts, subchondral sclerosis, periarticular osteophytes and joint space narrowing by imaging studies. This condition presents safety issues increasing the risk of falls.  There is no current active infection.  Patient Active Problem List   Diagnosis Date Noted  . Unilateral primary osteoarthritis, left hip 09/20/2016  . Left hip pain 06/10/2016  . Routine general medical examination at a health care facility 08/17/2011  . Melanoma of back ([place])   . Hyperlipemia 05/20/2008  . Benign essential hypertension 05/20/2008  . IMPAIRED FASTING GLUCOSE 05/20/2008   Past Medical History:  Diagnosis Date  . Cancer ([place])    basal cell- face  . History of kidney stones    has had twice  . Hyperlipidemia   . Hypertension   . Impaired fasting glucose   . Kidney stone 1983  . Melanoma of back [facility]) 2013   Dr Evorn Gong  . Pneumonia    walking back in 2002    Past Surgical History:  Procedure Laterality Date  . KNEE SURGERY  1969   cartilage removal right knee   . melanoma back  09/2010     Prescriptions Prior to Admission  Medication Sig Dispense Refill Last Dose  . acetaminophen (TYLENOL) 650 MG CR tablet Take 1,300 mg by mouth every 8 (eight) hours.   Past Week at Unknown time  . aspirin 81 MG tablet Take 81 mg by mouth daily.   Past Week at Unknown time  . B Complex-C (SUPER B COMPLEX PO) Take by mouth daily.   Past Week at Unknown time  . fish oil-omega-3 fatty acids 1000 MG capsule Take 1 g by mouth daily.   Past Week at Unknown time  . losartan-hydrochlorothiazide (HYZAAR) 100-25 MG tablet Take 1 tablet by mouth daily. 90 tablet 0 09/19/2016 at Unknown time  . pravastatin (PRAVACHOL) 40 MG tablet Take 1 tablet (40 mg total) by mouth daily. (Patient taking differently: Take 40 mg by mouth at bedtime. ) 90 tablet 0 09/19/2016 at Unknown time  . sildenafil (REVATIO) 20 MG tablet Take 3-5 tablets (60-100 mg total) by mouth daily as needed. (Patient taking differently: Take 60-100 mg by mouth daily as needed (erectile dysfunction). ) 50 tablet 11 Taking   Allergies  Allergen Reactions  . No Known Allergies     Social History  Substance Use Topics  . Smoking status: Never Smoker  . Smokeless tobacco: Never Used  . Alcohol use Yes     Comment: occasional beer    Family History  Problem Relation Age of Onset  . Stroke Father   . Heart disease Brother   . Diabetes Neg Hx   .  Colon cancer Neg Hx   . Stomach cancer Neg Hx   . Esophageal cancer Neg Hx   . Rectal cancer Neg Hx      Review of Systems  Musculoskeletal: Positive for joint pain.  All other systems reviewed and are negative.   Objective:  Physical Exam  Constitutional: He is oriented to person, place, and time. He appears well-developed and well-nourished.  HENT:  Head: Normocephalic and atraumatic.  Eyes: EOM are normal. Pupils are equal, round, and reactive to light.  Neck: Normal range of motion. Neck supple.  Cardiovascular: Normal rate and regular rhythm.   Respiratory: Effort normal and breath  sounds normal.  GI: Soft. Bowel sounds are normal.  Musculoskeletal:       Left hip: He exhibits decreased range of motion, decreased strength, tenderness and bony tenderness.  Neurological: He is alert and oriented to person, place, and time.  Skin: Skin is warm and dry.  Psychiatric: He has a normal mood and affect.    Vital signs in last 24 hours: Temp:  [98.2 F (36.8 C)] 98.2 F (36.8 C) (04/10 1010) Pulse Rate:  [79] 79 (04/10 1010) Resp:  [20] 20 (04/10 1010) BP: (190)/(78) 190/78 (04/10 1010) SpO2:  [99 %] 99 % (04/10 1010) Weight:  [233 lb (105.7 kg)] 233 lb (105.7 kg) (04/10 1022)  Labs:   Estimated body mass index is 33.91 kg/m as calculated from the following:   Height as of this encounter: 5' 9.5" (1.765 m).   Weight as of this encounter: 233 lb (105.7 kg).   Imaging Review Plain radiographs demonstrate severe degenerative joint disease of the left hip(s). The bone quality appears to be good for age and reported activity level.  Assessment/Plan:  End stage arthritis, left hip(s)  The patient history, physical examination, clinical judgement of the provider and imaging studies are consistent with end stage degenerative joint disease of the left hip(s) and total hip arthroplasty is deemed medically necessary. The treatment options including medical management, injection therapy, arthroscopy and arthroplasty were discussed at length. The risks and benefits of total hip arthroplasty were presented and reviewed. The risks due to aseptic loosening, infection, stiffness, dislocation/subluxation,  thromboembolic complications and other imponderables were discussed.  The patient acknowledged the explanation, agreed to proceed with the plan and consent was signed. Patient is being admitted for inpatient treatment for surgery, pain control, PT, OT, prophylactic antibiotics, VTE prophylaxis, progressive ambulation and ADL's and discharge planning.The patient is planning to be  discharged home with home health services

## 2016-09-20 NOTE — Op Note (Signed)
NAME:  Ricardo Reese, Ricardo Reese                     ACCOUNT NO.:  MEDICAL RECORD NO.:  [id number]  LOCATION:                                 FACILITY:  PHYSICIAN:  Lind Guest. Ninfa Linden, M.D.DATE OF BIRTH:  DATE OF PROCEDURE:  09/20/2016 DATE OF DISCHARGE:                              OPERATIVE REPORT   PREOPERATIVE DIAGNOSIS:  Primary osteoarthritis and degenerative joint disease, left hip.  POSTOPERATIVE DIAGNOSIS:  Primary osteoarthritis and degenerative joint disease, left hip.  PROCEDURE:  Left total hip arthroplasty through direct anterior approach.  IMPLANTS:  DePuy Sector Gription acetabular component size 54, size 36+ 4 polyethylene liner, size 12 Corail femoral component with varus offset, size 36+ 1.5 ceramic hip ball.  SURGEON:  Mcarthur Rossetti, MD.  ASSISTANT:  Erskine Emery, PA-C.  ANESTHESIA:  Spinal.  ANTIBIOTICS:  IV Ancef 3 g.  BLOOD LOSS:  200 mL.  COMPLICATIONS:  None.  INDICATIONS:  Mr. Castorena is a 66 year old gentleman with debilitating arthritis involving his right hip.  This is well documented and definitely has x-ray evidence of complete loss of the superior lateral joint space as well as cystic and sclerotic changes.  His pain is daily and it is 10/10.  It has detrimentally affected his activities of daily living, his quality of life, and his mobility.  At this point, he does wish to proceed with a total hip arthroplasty.  He understands the risk of acute blood loss anemia, nerve and vessel injury, fracture, infection, dislocation, DVT.  He understands our goals are decreased pain, improved mobility, and overall improved quality of life.  PROCEDURE DESCRIPTION:  After informed consent was obtained and appropriate left hip was marked, he was brought to the operating room, where spinal anesthesia was obtained.  He was then placed supine on the Hana fracture table, the perineal post in place, and both legs in inline skeletal traction boots, but  no traction applied.  His left operative hip was prepped and draped with DuraPrep and sterile drapes.  Time-out was called and he was identified as correct patient, correct left hip. We then made an incision just inferior and posterior to the anterior superior iliac spine and carried this obliquely down the leg.  We dissected down the tensor fascia lata muscle.  The tensor fascia was then divided longitudinally to proceed with a direct anterior approach to the hip.  We identified and cauterized the circumflex vessels and identified the hip capsule.  I opened up the hip capsule in an L-type format finding a large joint effusion and significant periarticular osteophytes.  We placed Cobra retractor around the medial and lateral femoral neck and then made our femoral neck cut with an oscillating saw and completed this on osteotome.  We placed a corkscrew guide in the femoral head and removed the femoral head in its entirety and found to be devoid of all cartilage.  We then placed a bent Hohmann over the medial acetabular rim and removed remnants of the acetabular labrum.  We then began reaming under direct visualization from a size 43 reamer and moving up to a size 54 with all reamers under direct visualization.  The last reamer  under direct fluoroscopy, so we could obtain our depth of reaming, our inclination, and anteversion.  Once we were pleased with this, we placed the real DePuy Sector Gription acetabular component size 54, and a 36+ 4 polyethylene liner, based off his offset.  Attention was then turned to the femur.  With the leg externally rotated to 120 degrees, extended and adducted, we were able to place a Hohmann retractor behind the greater trochanter and a Mueller retractor medially.  We released the joint capsule and used a box cutting osteotome to enter femoral canal and a rongeur to lateralize.  We then began broaching from a size 8 broach using the Corail broaching  system going up to a size 12 lateralizing as much as we could.  With a size 12 in place, we trialed a varus offset femoral neck and a 36+ 1.5 hip ball. We brought leg back over and up with traction and internal rotation.  We were pleased with leg length, offset, and stability.  We then dislocated the hip and removed the trial components.  We placed the real Corail femoral component.  Size 12 with varus offset and the real 36+ 1.5 ceramic hip ball.  Once again reduced this in the acetabulum, we were pleased with leg length, offset, and stability.  We then irrigated the soft tissue with normal saline solution using pulsatile lavage.  We closed the joint capsule interrupted #1 Ethibond suture, followed by running #1 Vicryl in the tensor fascia, 0 Vicryl in the deep tissue, 2-0 Vicryl in subcutaneous tissue, and 4-0 Monocryl subcuticular stitch and Steri-Strips on the skin.  An Aquacel dressing was applied.  He was taken off the Hana table and taken to the recovery room in stable condition.  All final counts were correct.  There were no complications noted.  Of note, Erskine Emery, PA-C, assisted in the entire case and his assistance was crucial for facilitating all aspects of this case.     Lind Guest. Ninfa Linden, M.D.     CYB/MEDQ  D:  09/20/2016  T:  09/20/2016  Job:  [id number]

## 2016-09-20 NOTE — Brief Op Note (Signed)
09/20/2016  1:34 PM  PATIENT:  Ricardo Reese  66 y.o. male  PRE-OPERATIVE DIAGNOSIS:  left hip osteoarthritis  POST-OPERATIVE DIAGNOSIS:  left hip osteoarthritis  PROCEDURE:  Procedure(s): LEFT TOTAL HIP ARTHROPLASTY ANTERIOR APPROACH (Left)  SURGEON:  Surgeon(s) and Role:    * Mcarthur Rossetti, MD - Primary  PHYSICIAN ASSISTANT: Benita Stabile, PA-C  ANESTHESIA:   spinal  EBL:  Total I/O In: 1000 [I.V.:1000] Out: 200 [Blood:200]  COUNTS:  YES  DICTATION: .Other Dictation: Dictation Number [id number]  PLAN OF CARE: Admit to inpatient   PATIENT DISPOSITION:  PACU - hemodynamically stable.   Delay start of Pharmacological VTE agent (>24hrs) due to surgical blood loss or risk of bleeding: no

## 2016-09-20 NOTE — Anesthesia Procedure Notes (Signed)
Procedure Name: MAC Date/Time: 09/20/2016 12:10 PM Performed by: Jenne Campus Pre-anesthesia Checklist: Patient identified, Emergency Drugs available, Suction available, Patient being monitored and Timeout performed Oxygen Delivery Method: Simple face mask

## 2016-09-20 NOTE — Anesthesia Preprocedure Evaluation (Addendum)
Anesthesia Evaluation  Patient identified by MRN, date of birth, ID band Patient awake    Reviewed: Allergy & Precautions, NPO status , Patient's Chart, lab work & pertinent test results  Airway Mallampati: II   Neck ROM: Full    Dental  (+) Teeth Intact, Dental Advisory Given, Implants,    Pulmonary neg pulmonary ROS,    breath sounds clear to auscultation       Cardiovascular hypertension, negative cardio ROS   Rhythm:Regular Rate:Normal     Neuro/Psych negative neurological ROS  negative psych ROS   GI/Hepatic negative GI ROS, Neg liver ROS,   Endo/Other  negative endocrine ROSMorbid obesity  Renal/GU negative Renal ROS  negative genitourinary   Musculoskeletal negative musculoskeletal ROS (+) Arthritis ,   Abdominal (+) + obese,   Peds negative pediatric ROS (+)  Hematology negative hematology ROS (+)   Anesthesia Other Findings   Reproductive/Obstetrics negative OB ROS                            Anesthesia Physical Anesthesia Plan  ASA: II  Anesthesia Plan: Spinal   Post-op Pain Management:    Induction: Intravenous  Airway Management Planned: Natural Airway and Simple Face Mask  Additional Equipment:   Intra-op Plan:   Post-operative Plan:   Informed Consent: I have reviewed the patients History and Physical, chart, labs and discussed the procedure including the risks, benefits and alternatives for the proposed anesthesia with the patient or authorized representative who has indicated his/her understanding and acceptance.     Plan Discussed with: CRNA  Anesthesia Plan Comments:         Anesthesia Quick Evaluation

## 2016-09-20 NOTE — Transfer of Care (Signed)
Immediate Anesthesia Transfer of Care Note  Patient: Ricardo Reese  Procedure(s) Performed: Procedure(s): LEFT TOTAL HIP ARTHROPLASTY ANTERIOR APPROACH (Left)  Patient Location: PACU  Anesthesia Type:MAC and Spinal  Level of Consciousness: awake, oriented and patient cooperative  Airway & Oxygen Therapy: Patient Spontanous Breathing and Patient connected to face mask oxygen  Post-op Assessment: Report given to RN and Post -op Vital signs reviewed and stable  Post vital signs: Reviewed  Last Vitals:  Vitals:   09/20/16 1010 09/20/16 1357  BP: (!) 190/78   Pulse: 79   Resp: 20   Temp: 36.8 C (P) 36.6 C    Last Pain:  Vitals:   09/20/16 1357  TempSrc:   PainSc: (P) 0-No pain         Complications: No apparent anesthesia complications

## 2016-09-20 NOTE — Anesthesia Procedure Notes (Signed)
Spinal  Patient location during procedure: OR Start time: 09/20/2016 12:05 PM End time: 09/20/2016 12:12 PM Staffing Anesthesiologist: Chenell Lozon Performed: anesthesiologist  Preanesthetic Checklist Completed: patient identified, site marked, surgical consent, pre-op evaluation, timeout performed, IV checked, risks and benefits discussed and monitors and equipment checked Spinal Block Patient position: sitting Prep: Betadine Patient monitoring: heart rate, cardiac monitor, continuous pulse ox and blood pressure Approach: midline Location: L3-4 Injection technique: single-shot Needle Needle type: Quincke  Needle gauge: 25 G Needle length: 9 cm Needle insertion depth: 7 cm Assessment Sensory level: T6

## 2016-09-21 ENCOUNTER — Encounter (HOSPITAL_COMMUNITY): Payer: Self-pay | Admitting: Orthopaedic Surgery

## 2016-09-21 LAB — CBC
HCT: 39 % (ref 39.0–52.0)
Hemoglobin: 13.3 g/dL (ref 13.0–17.0)
MCH: 29.6 pg (ref 26.0–34.0)
MCHC: 34.1 g/dL (ref 30.0–36.0)
MCV: 86.9 fL (ref 78.0–100.0)
PLATELETS: 244 10*3/uL (ref 150–400)
RBC: 4.49 MIL/uL (ref 4.22–5.81)
RDW: 13.8 % (ref 11.5–15.5)
WBC: 11.6 10*3/uL — ABNORMAL HIGH (ref 4.0–10.5)

## 2016-09-21 LAB — BASIC METABOLIC PANEL
Anion gap: 11 (ref 5–15)
BUN: 21 mg/dL — AB (ref 6–20)
CO2: 23 mmol/L (ref 22–32)
CREATININE: 0.78 mg/dL (ref 0.61–1.24)
Calcium: 8.9 mg/dL (ref 8.9–10.3)
Chloride: 100 mmol/L — ABNORMAL LOW (ref 101–111)
GFR calc Af Amer: >60 mL/min (ref >60)
GFR calc non Af Amer: >60 mL/min (ref >60)
Glucose, Bld: 173 mg/dL — ABNORMAL HIGH (ref 65–99)
Potassium: 4.2 mmol/L (ref 3.5–5.1)
SODIUM: 134 mmol/L — AB (ref 135–145)

## 2016-09-21 NOTE — Progress Notes (Signed)
Subjective: 1 Day Post-Op Procedure(s) (LRB): LEFT TOTAL HIP ARTHROPLASTY ANTERIOR APPROACH (Left) Patient reports pain as moderate.  Labs and vitals stable.  Reports doing well overall.  Anticipating therapy.  Objective: Vital signs in last 24 hours: Temp:  [97.8 F (36.6 C)-98.6 F (37 C)] 97.9 F (36.6 C) (04/11 0436) Pulse Rate:  [49-93] 72 (04/11 0436) Resp:  [11-20] 16 (04/11 0436) BP: (115-190)/(60-86) 165/83 (04/11 0436) SpO2:  [95 %-100 %] 99 % (04/11 0436) Weight:  [233 lb (105.7 kg)] 233 lb (105.7 kg) (04/10 1022)  Intake/Output from previous day: 04/10 0701 - 04/11 0700 In: 2270 [P.O.:970; I.V.:1300] Out: 950 [Urine:750; Blood:200] Intake/Output this shift: No intake/output data recorded.   Recent Labs  09/21/16 0514  HGB 13.3    Recent Labs  09/21/16 0514  WBC 11.6*  RBC 4.49  HCT 39.0  PLT 244    Recent Labs  09/21/16 0514  NA 134*  K 4.2  CL 100*  CO2 23  BUN 21*  CREATININE 0.78  GLUCOSE 173*  CALCIUM 8.9   No results for input(s): LABPT, INR in the last 72 hours.  Sensation intact distally Intact pulses distally Dorsiflexion/Plantar flexion intact Incision: scant drainage  Assessment/Plan: 1 Day Post-Op Procedure(s) (LRB): LEFT TOTAL HIP ARTHROPLASTY ANTERIOR APPROACH (Left) Up with therapy Plan for discharge tomorrow Discharge home with home health  Mcarthur Rossetti 09/21/2016, 7:08 AM

## 2016-09-21 NOTE — Care Management Note (Signed)
Case Management Note  Patient Details  Name: Ricardo Reese MRN: [id number] Date of Birth: 11/14/1950  Subjective/Objective:   66 yr old gentleman s/p left total knee arthroplasty.                 Action/Plan: Case manager spoke with patient and [name] concerning discharge plan and DME. Per therapy, and confirmed with Dr. Ninfa Linden, [name] will not need Home Health therapy. CM has ordered RW and 3in1 which will be delivered to his room prior to discharge. Patient will have family support at discharge.    Expected Discharge Date:   09/22/16               Expected Discharge Plan:  [place]  In-House Referral:  NA  Discharge planning Services  CM Consult  Post Acute Care Choice:  Durable Medical Equipment Choice offered to:  NA  DME Arranged:  3-N-1, Walker rolling DME Agency:  [place]:  NA [place] Agency:  NA, [facility] ([facility])  Status of Service:  Completed, signed off  If discussed at [employer] of Stay Meetings, dates discussed:    Additional Comments:  Ninfa Meeker, RN 09/21/2016, 2:59 PM

## 2016-09-21 NOTE — Care Management (Signed)
Patient's bedside RN clarified with Dr. Ninfa Linden that patient will not require Home Health Physical therapy, he is progressing well.

## 2016-09-21 NOTE — Evaluation (Signed)
Occupational Therapy Evaluation and Discharge Summary Patient Details Name: Ricardo Reese MRN: [id number] DOB: [birth year] Today's Date: 09/21/2016    History of Present Illness Pt is a 66 yo male admitted for L anterior THR.  Pt is WBAT.  PMH includes skin CA and R knee surgery.     Clinical Impression   Pt admitted with the above diagnosis and overall is doing very well from an adl standpoint.  Pt had a significant amount of pain before surgery and has already been incorporating adl techniques to assist after surgery into his morning routine.  Pt now with much less pain so is actually more independent than before surgery.  No further OT needs at this time and all education is complete.    Follow Up Recommendations  No OT follow up;Supervision - Intermittent    Equipment Recommendations  3 in 1 bedside commode    Recommendations for Other Services       Precautions / Restrictions Restrictions Weight Bearing Restrictions: Yes      Mobility Bed Mobility Overal bed mobility: Modified Independent             General bed mobility comments: no use of bedrails and bed flat.  Transfers Overall transfer level: Needs assistance Equipment used: Rolling walker (2 wheeled) Transfers: Sit to/from Stand Sit to Stand: Supervision         General transfer comment: first time up so supervision provided.  Pt never needed physical assist but moves quickly so did require cues to slow down and take his time.  Pt also feeling so much better that he was ready to go.    Balance Overall balance assessment: No apparent balance deficits (not formally assessed)                                         ADL either performed or assessed with clinical judgement   ADL Overall ADL's : Modified independent                                       General ADL Comments: Pt was so debilitated before surgery he had taught himself many adaptive techniques to complete  adls.  Pt still using these techniques and only needed assist donning L sock. Wife can assist and pt should be independent with this soon.       Vision Baseline Vision/History: No visual deficits Patient Visual Report: No change from baseline Vision Assessment?: No apparent visual deficits     Perception     Praxis      Pertinent Vitals/Pain Pain Assessment: 0-10 Pain Score: 2  Pain Location: L hip Pain Descriptors / Indicators: Operative site guarding Pain Intervention(s): Monitored during session;Repositioned;Ice applied     Hand Dominance Right   Extremity/Trunk Assessment Upper Extremity Assessment Upper Extremity Assessment: Overall WFL for tasks assessed   Lower Extremity Assessment Lower Extremity Assessment: Defer to PT evaluation   Cervical / Trunk Assessment Cervical / Trunk Assessment: Normal   Communication Communication Communication: No difficulties   Cognition Arousal/Alertness: Awake/alert Behavior During Therapy: WFL for tasks assessed/performed Overall Cognitive Status: Within Functional Limits for tasks assessed  General Comments       Exercises     Shoulder Instructions      Home Living Family/patient expects to be discharged to:: Private residence Living Arrangements: Spouse/significant other Available Help at Discharge: Family;Available 24 hours/day Type of Home: House Home Access: Stairs to enter [employer] of Steps: 2 Entrance Stairs-Rails: Right Home Layout: Two level Alternate Level Stairs-Number of Steps: 15 Alternate Level Stairs-Rails: Left Bathroom Shower/Tub: Walk-in Hydrologist: Standard     Home Equipment: None   Additional Comments: needs BSC and walker      Prior Functioning/Environment Level of Independence: Independent        Comments: pt in a great amout of pain before surgery but better after.        OT Problem List:         OT Treatment/Interventions:      OT Goals(Current goals can be found in the care plan section) Acute Rehab OT Goals Patient Stated Goal: to go home OT Goal Formulation: All assessment and education complete, DC therapy  OT Frequency:     Barriers to D/C:            Co-evaluation              End of Session Equipment Utilized During Treatment: Rolling walker Nurse Communication: Mobility status  Activity Tolerance: Patient tolerated treatment well Patient left: in chair;with call bell/phone within reach  OT Visit Diagnosis: Unsteadiness on feet (R26.81)                Time: 2197-5883 OT Time Calculation (min): 25 min Charges:  OT General Charges $OT Visit: 1 Procedure OT Evaluation $OT Eval Low Complexity: 1 Procedure OT Treatments $Self Care/Home Management : 8-22 mins G-Codes:     [name], OTR/L [phone]  [name] 09/21/2016, 11:02 AM

## 2016-09-21 NOTE — Progress Notes (Signed)
Physical Therapy Evaluation Patient Details Name: Ricardo Reese MRN: [id number] DOB: 06-28-1950 Today's Date: 09/21/2016   History of Present Illness  Pt is a 66 yo male admitted for L anterior THR.  Pt is WBAT.  PMH includes skin CA and R knee surgery.    Clinical Impression  Patient seen following OT treatment.  Anxious to get up and be independent.  Supervision overall, pain managed well post surgery.  Patient with improved gait pattern using assistive device (walker or cane), but did demonstrate ability to ambulate without device.  Patient will progress quickly to independence, will continue on PT for additional education and pain monitoring as needed while in hospital.     Follow Up Recommendations No PT follow up    Equipment Recommendations  None recommended by PT (Cane for PRN use if desired.)    Recommendations for Other Services       Precautions / Restrictions Precautions Precautions:  (Direct lateral hip) Restrictions Weight Bearing Restrictions: Yes      Mobility  Bed Mobility Overal bed mobility: Modified Independent             General bed mobility comments: no use of bedrails and bed flat.  Transfers Overall transfer level: Needs assistance Equipment used: Rolling walker (2 wheeled);Straight cane;None Transfers: Sit to/from Stand Sit to Stand: Supervision         General transfer comment: first time up so supervision provided.  Pt never needed physical assist but cues for decreased quick movements.  Pt also feeling so much better that he was ready to go.  Ambulation/Gait Ambulation/Gait assistance: Supervision Ambulation Distance (Feet): 250 Feet Assistive device: Rolling walker (2 wheeled);Straight cane;None Gait Pattern/deviations: Step-through pattern;Antalgic   Gait velocity interpretation: at or above normal speed for age/gender General Gait Details: Increased lateral trunk sway without device, post surgical pain.  Stairs Stairs:  Yes Stairs assistance: Supervision Stair Management: One rail Left;With cane;Alternating pattern Number of Stairs: 10 General stair comments: Demonstrates alternating step pattern, educated alternate technique if painful.  Wheelchair Mobility    Modified Rankin (Stroke Patients Only)       Balance Overall balance assessment: No apparent balance deficits (not formally assessed)                                           Pertinent Vitals/Pain Pain Assessment: 0-10 Pain Score: 1  Pain Location: L hip Pain Descriptors / Indicators: Operative site guarding Pain Intervention(s): Monitored during session    Home Living Family/patient expects to be discharged to:: Private residence Living Arrangements: Spouse/significant other Available Help at Discharge: Family;Available 24 hours/day Type of Home: House Home Access: Stairs to enter Entrance Stairs-Rails: Right Entrance Stairs-Number of Steps: 2 Home Layout: Two level Home Equipment: None Additional Comments: needs BSC and walker    Prior Function Level of Independence: Independent         Comments: pt in a great amout of pain before surgery but better after.     Hand Dominance   Dominant Hand: Right    Extremity/Trunk Assessment   Upper Extremity Assessment Upper Extremity Assessment: Overall WFL for tasks assessed    Lower Extremity Assessment Lower Extremity Assessment: Overall WFL for tasks assessed (Pain/gait deviation mild for left hip.)    Cervical / Trunk Assessment Cervical / Trunk Assessment: Normal  Communication   Communication: No difficulties  Cognition Arousal/Alertness: Awake/alert Behavior During  Therapy: WFL for tasks assessed/performed Overall Cognitive Status: Within Functional Limits for tasks assessed                                        General Comments      Exercises     Assessment/Plan    PT Assessment Patient needs continued PT services   PT Problem List Decreased balance;Decreased mobility;Decreased knowledge of precautions       PT Treatment Interventions DME instruction;Stair training;Gait training;Functional mobility training;Therapeutic exercise;Patient/family education    PT Goals (Current goals can be found in the Care Plan section)  Acute Rehab PT Goals Patient Stated Goal: to go home PT Goal Formulation: With patient Time For Goal Achievement: 10/05/16 Potential to Achieve Goals: Good    Frequency BID   Barriers to discharge        Co-evaluation               End of Session Equipment Utilized During Treatment: Gait belt Activity Tolerance: Patient tolerated treatment well;No increased pain Patient left: in chair;with call bell/phone within reach Nurse Communication: Mobility status PT Visit Diagnosis: Muscle weakness (generalized) (M62.81);Unsteadiness on feet (R26.81);Pain Pain - Right/Left: Left Pain - part of body: Hip    Time: 1100-1125 PT Time Calculation (min) (ACUTE ONLY): 25 min   Charges:   PT Evaluation $PT Eval Low Complexity: 1 Procedure PT Treatments $Gait Training: 8-22 mins   PT G Codes:        Judith Blonder, DPT  Zenia Resides, Reema Chick L 09/21/2016, 11:58 AM

## 2016-09-21 NOTE — Anesthesia Postprocedure Evaluation (Addendum)
Anesthesia Post Note  Patient: Ricardo Reese  Procedure(s) Performed: Procedure(s) (LRB): LEFT TOTAL HIP ARTHROPLASTY ANTERIOR APPROACH (Left)  Patient location during evaluation: PACU Anesthesia Type: Spinal Level of consciousness: oriented and awake and alert Pain management: pain level controlled Vital Signs Assessment: post-procedure vital signs reviewed and stable Respiratory status: spontaneous breathing, respiratory function stable and patient connected to nasal cannula oxygen Cardiovascular status: blood pressure returned to baseline and stable Postop Assessment: no headache and no backache Anesthetic complications: no       Last Vitals:  Vitals:   09/21/16 0055 09/21/16 0436  BP: (!) 153/60 (!) 165/83  Pulse: 76 72  Resp: 16 16  Temp: 37 C 36.6 C    Last Pain:  Vitals:   09/21/16 0646  TempSrc:   PainSc: 3                  [name],JAMES TERRILL

## 2016-09-22 DIAGNOSIS — M1612 Unilateral primary osteoarthritis, left hip: Secondary | ICD-10-CM

## 2016-09-22 MED ORDER — METHOCARBAMOL 500 MG PO TABS: 500.0000 mg | ORAL_TABLET | Freq: Four times a day (QID) | ORAL | 1 refills | Status: DC | PRN

## 2016-09-22 MED ORDER — ASPIRIN 81 MG PO CHEW: 81.0000 mg | CHEWABLE_TABLET | Freq: Two times a day (BID) | ORAL | 0 refills | Status: DC

## 2016-09-22 MED ORDER — OXYCODONE-ACETAMINOPHEN 5-325 MG PO TABS: 1.0000 | ORAL_TABLET | ORAL | 0 refills | Status: DC | PRN

## 2016-09-22 NOTE — Progress Notes (Signed)
Subjective: 2 Days Post-Op Procedure(s) (LRB): LEFT TOTAL HIP ARTHROPLASTY ANTERIOR APPROACH (Left) Patient reports pain as mild to moderate.   Denies Chest pain , SOB , nausea or vomiting.  Objective: Vital signs in last 24 hours: Temp:  [98.1 F (36.7 C)-98.7 F (37.1 C)] 98.7 F (37.1 C) (04/12 0412) Pulse Rate:  [77-88] 78 (04/12 0412) Resp:  [18] 18 (04/12 0412) BP: (130-169)/(68-80) 169/80 (04/12 0412) SpO2:  [98 %-99 %] 99 % (04/12 0412)  Intake/Output from previous day: 04/11 0701 - 04/12 0700 In: 2537.5 [P.O.:720; I.V.:1817.5] Out: -  Intake/Output this shift: Total I/O In: 360 [P.O.:360] Out: -    Recent Labs  09/21/16 0514  HGB 13.3    Recent Labs  09/21/16 0514  WBC 11.6*  RBC 4.49  HCT 39.0  PLT 244    Recent Labs  09/21/16 0514  NA 134*  K 4.2  CL 100*  CO2 23  BUN 21*  CREATININE 0.78  GLUCOSE 173*  CALCIUM 8.9   No results for input(s): LABPT, INR in the last 72 hours.  Sensation intact distally Incision: dressing C/D/I Compartment soft  Assessment/Plan: 2 Days Post-Op Procedure(s) (LRB): LEFT TOTAL HIP ARTHROPLASTY ANTERIOR APPROACH (Left) Discharge to home    [facility] 09/22/2016, 11:00 AM

## 2016-09-22 NOTE — Discharge Instructions (Signed)

## 2016-09-22 NOTE — Progress Notes (Signed)
Patient verbalized understanding of discharge instructions. Patient left with 3 in 1 and rolling front wheel walker. Patient's wife at bedside. Patient provided copy of discharge instructions and prescriptions.

## 2016-09-22 NOTE — Progress Notes (Signed)
Late entry PT note for 09/21/16.  Patient seen for afternoon PT session, spouse also present.  Education and return demonstration for all mobility and gait including stairs with patient and spouse.  Patient is overall Supervision to MOD Independent, and no questions remain at this time regarding return home and functional mobility and care.  Patient will be DC from skilled PT services, acute therapy goals met.   09/21/16 1400  PT Visit Information  Last PT Received On 09/21/16  Assistance Needed +1  History of Present Illness Pt is a 66 yo male admitted for L anterior THR.  Pt is WBAT.  PMH includes skin CA and R knee surgery.    Subjective Data  Subjective Get stiff if sit in one place too long.  Patient Stated Goal to go home  Precautions  Precautions (Direct lateral hip)  Restrictions  Weight Bearing Restrictions Yes  Pain Assessment  Pain Assessment 0-10  Pain Score 0  Pain Location L hip  Pain Descriptors / Indicators Operative site guarding  Pain Intervention(s) Monitored during session  Cognition  Arousal/Alertness Awake/alert  Behavior During Therapy WFL for tasks assessed/performed  Overall Cognitive Status Within Functional Limits for tasks assessed  Bed Mobility  Overal bed mobility Modified Independent  General bed mobility comments no use of bedrails and bed flat.  Transfers  Overall transfer level Modified independent  Equipment used None  Transfers Sit to/from Stand  Sit to Stand Modified independent (Device/Increase time)  Ambulation/Gait  Ambulation/Gait assistance Modified independent (Device/Increase time);Supervision  Ambulation Distance (Feet) 250 Feet  Assistive device Rolling walker (2 wheeled)  Gait Pattern/deviations Step-through pattern;Antalgic  Gait velocity 2.1 ft/sec  Gait velocity interpretation >2.62 ft/sec, indicative of independent community ambulator  Stairs Yes  Stairs assistance Supervision  Stair Management One rail Left;With  cane;Alternating pattern  Number of Stairs 10  General stair comments Demonstrates alternating step pattern, educated alternate technique if painful.  Balance  Overall balance assessment No apparent balance deficits (not formally assessed)  General Comments  General comments (skin integrity, edema, etc.) Spouse present for treatment session, provided education and training.  Exercises  Exercises (Provided handout and education for surgical hip exercises.)  PT - End of Session  Equipment Utilized During Treatment Gait belt  Activity Tolerance Patient tolerated treatment well;No increased pain  Patient left in chair;with call bell/phone within reach  Nurse Communication Mobility status  PT - Assessment/Plan  PT Plan Current plan remains appropriate  PT Visit Diagnosis Muscle weakness (generalized) (M62.81);Unsteadiness on feet (R26.81);Pain  Pain - Right/Left Left  Pain - part of body Hip  PT Frequency (ACUTE ONLY) BID  Follow Up Recommendations No PT follow up  PT equipment None recommended by PT (Cane for PRN use if desired.)  AM-PAC PT "6 Clicks" Daily Activity Outcome Measure  Difficulty turning over in bed (including adjusting bedclothes, sheets and blankets)? 4  Difficulty moving from lying on back to sitting on the side of the bed?  4  Difficulty sitting down on and standing up from a chair with arms (e.g., wheelchair, bedside commode, etc,.)? 4  Help needed moving to and from a bed to chair (including a wheelchair)? 3  Help needed walking in hospital room? 4  Help needed climbing 3-5 steps with a railing?  3  6 Click Score 22  Mobility G Code  CJ  PT Goal Progression  Progress towards PT goals Goals met/education completed, patient discharged from PT  Acute Rehab PT Goals  PT Goal Formulation With patient    Time For Goal Achievement 10/05/16  Potential to Achieve Goals Good  PT Time Calculation  PT Start Time (ACUTE ONLY) 1400  PT Stop Time (ACUTE ONLY) 1425  PT Time  Calculation (min) (ACUTE ONLY) 25 min  PT General Charges  $$ ACUTE PT VISIT 1 Procedure  PT Treatments  $Gait Training 8-22 mins  $Therapeutic Activity 8-22 mins   Judith Blonder, DPT

## 2016-09-22 NOTE — Discharge Summary (Signed)
Patient ID: Ricardo Reese MRN: [id number] DOB/AGE: [birth year] 66 y.o.  Admit date: 09/20/2016 Discharge date: 09/22/2016  Admission Diagnoses:  Principal Problem:   Unilateral primary osteoarthritis, left hip Active Problems:   Status post left hip replacement   Discharge Diagnoses:  Same  Past Medical History:  Diagnosis Date  . Cancer ([place])    basal cell- face  . History of kidney stones    has had twice  . Hyperlipidemia   . Hypertension   . Impaired fasting glucose   . Kidney stone 1983  . 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 Pneumonia    walking back in 2002    Surgeries: Procedure(s): LEFT TOTAL HIP ARTHROPLASTY ANTERIOR APPROACH on 09/20/2016   Consultants:   Discharged Condition: Improved  Hospital Course: Ricardo Reese is an 66 y.o. male who was admitted 09/20/2016 for operative treatment ofUnilateral primary osteoarthritis, left hip. Patient has severe unremitting pain that affects sleep, daily activities, and work/hobbies. After pre-op clearance the patient was taken to the operating room on 09/20/2016 and underwent  Procedure(s): LEFT TOTAL HIP ARTHROPLASTY ANTERIOR APPROACH.    Patient was given perioperative antibiotics: Anti-infectives    Start     Dose/Rate Route Frequency Ordered Stop   09/20/16 1830  ceFAZolin (ANCEF) IVPB 1 g/50 mL premix     1 g 100 mL/hr over 30 Minutes Intravenous Every 6 hours 09/20/16 1714 09/21/16 0039   09/20/16 1130  ceFAZolin (ANCEF) 3 g in dextrose 5 % 50 mL IVPB     3 g 130 mL/hr over 30 Minutes Intravenous To [facility] 09/19/16 0919 09/20/16 1223       Patient was given sequential compression devices, early ambulation, and chemoprophylaxis to prevent DVT.  Patient benefited maximally from hospital stay and there were no complications.    Recent vital signs: Patient Vitals for the past 24 hrs:  BP Temp Temp src Pulse Resp SpO2  09/22/16 0412 (!) 169/80 98.7 F (37.1 C) Oral 78 18 99 %  09/21/16 2037  (!) 148/69 98.2 F (36.8 C) Oral 88 18 98 %  09/21/16 1400 130/68 98.1 F (36.7 C) Oral 77 18 98 %     Recent laboratory studies:  Recent Labs  09/21/16 0514  WBC 11.6*  HGB 13.3  HCT 39.0  PLT 244  NA 134*  K 4.2  CL 100*  CO2 23  BUN 21*  CREATININE 0.78  GLUCOSE 173*  CALCIUM 8.9     Discharge Medications:   Allergies as of 09/22/2016      Reactions   No Known Allergies       Medication List    STOP taking these medications   aspirin 81 MG tablet Replaced by:  aspirin 81 MG chewable tablet     TAKE these medications   acetaminophen 650 MG CR tablet Commonly known as:  TYLENOL Take 1,300 mg by mouth every 8 (eight) hours.   aspirin 81 MG chewable tablet Chew 1 tablet (81 mg total) by mouth 2 (two) times daily. Replaces:  aspirin 81 MG tablet   fish oil-omega-3 fatty acids 1000 MG capsule Take 1 g by mouth daily.   losartan-hydrochlorothiazide 100-25 MG tablet Commonly known as:  HYZAAR Take 1 tablet by mouth daily.   methocarbamol 500 MG tablet Commonly known as:  ROBAXIN Take 1 tablet (500 mg total) by mouth every 6 (six) hours as needed for muscle spasms.   oxyCODONE-acetaminophen 5-325 MG tablet Commonly known as:  ROXICET Take 1-2 tablets by mouth every 4 (four) hours as needed for severe pain.   pravastatin 40 MG tablet Commonly known as:  PRAVACHOL Take 1 tablet (40 mg total) by mouth daily. What changed:  when to take this   sildenafil 20 MG tablet Commonly known as:  REVATIO Take 3-5 tablets (60-100 mg total) by mouth daily as needed. What changed:  reasons to take this   SUPER B COMPLEX PO Take by mouth daily.            Durable Medical Equipment        Start     Ordered   09/20/16 1715  DME Walker rolling  Once    Question:  Patient needs a walker to treat with the following condition  Answer:  Status post left hip replacement   09/20/16 1714   09/20/16 1715  DME 3 n 1  Once     09/20/16 1714      Diagnostic  Studies: Dg Pelvis Portable  Result Date: 09/20/2016 CLINICAL DATA:  Post left hip replacement today EXAM: PORTABLE PELVIS 1-2 VIEWS COMPARISON:  Fluoro spot radiographs of today's date FINDINGS: The patient has undergone left total hip joint prosthesis placement. Radiographic positioning of the prosthetic components is good. The interface with the native bone appears normal. The native right hip is unremarkable. IMPRESSION: No postprocedure complication following left hip joint prosthesis placement. Electronically Signed   By: David  Martinique M.D.   On: 09/20/2016 14:35   Dg C-arm 1-60 Min  Result Date: 09/20/2016 CLINICAL DATA:  Status post total hip replacement EXAM: DG C-ARM 61-120 MIN; OPERATIVE LEFT HIP COMPARISON:  August 05, 2016 FLUOROSCOPY TIME:  0 minutes 22 seconds; 4 acquired images FINDINGS: Frontal views obtained. Initial image shows extensive arthropathy in the left hip joint. Subsequent imaging shows total hip replacement on the left with prosthetic components appearing well-seated. No acute fracture or dislocation. IMPRESSION: Status post left total hip replacement with prosthetic components well-seated on frontal view. No evident fracture or dislocation. Electronically Signed   By: Lowella Grip III M.D.   On: 09/20/2016 13:46   Dg Hip Operative Unilat W Or W/o Pelvis Left  Result Date: 09/20/2016 CLINICAL DATA:  Status post total hip replacement EXAM: DG C-ARM 61-120 MIN; OPERATIVE LEFT HIP COMPARISON:  August 05, 2016 FLUOROSCOPY TIME:  0 minutes 22 seconds; 4 acquired images FINDINGS: Frontal views obtained. Initial image shows extensive arthropathy in the left hip joint. Subsequent imaging shows total hip replacement on the left with prosthetic components appearing well-seated. No acute fracture or dislocation. IMPRESSION: Status post left total hip replacement with prosthetic components well-seated on frontal view. No evident fracture or dislocation. Electronically Signed    By: Lowella Grip III M.D.   On: 09/20/2016 13:46    Disposition: Final discharge disposition not confirmed    Follow-up Information    Mcarthur Rossetti, MD. Schedule an appointment as soon as possible for a visit in 2 week(s).   Specialty:  Orthopedic Surgery Contact information: [place] [address] [phone]            Signed: Erskine Emery 09/22/2016, 10:51 AM

## 2016-09-23 ENCOUNTER — Telehealth (INDEPENDENT_AMBULATORY_CARE_PROVIDER_SITE_OTHER): Payer: Self-pay | Admitting: *Deleted

## 2016-09-23 NOTE — Telephone Encounter (Signed)
Joelene Millin calling stating she tried to schedule pt and he stated that he did not need physical therapy at home, Joelene Millin just clarifying before she cancels patient

## 2016-09-23 NOTE — Telephone Encounter (Signed)
Please advise 

## 2016-09-23 NOTE — Telephone Encounter (Signed)
Yes, he does not need therapy at all.

## 2016-09-26 ENCOUNTER — Ambulatory Visit (INDEPENDENT_AMBULATORY_CARE_PROVIDER_SITE_OTHER): Payer: Medicare Other | Admitting: Internal Medicine

## 2016-09-26 ENCOUNTER — Encounter: Payer: Self-pay | Admitting: Internal Medicine

## 2016-09-26 VITALS — BP 144/76 | HR 79 | Temp 98.4°F | Ht 69.75 in | Wt 233.2 lb

## 2016-09-26 DIAGNOSIS — Z0000 Encounter for general adult medical examination without abnormal findings: Secondary | ICD-10-CM | POA: Diagnosis not present

## 2016-09-26 DIAGNOSIS — I10 Essential (primary) hypertension: Secondary | ICD-10-CM

## 2016-09-26 DIAGNOSIS — Z85820 Personal history of malignant melanoma of skin: Secondary | ICD-10-CM

## 2016-09-26 DIAGNOSIS — R7301 Impaired fasting glucose: Secondary | ICD-10-CM | POA: Diagnosis not present

## 2016-09-26 DIAGNOSIS — Z7189 Other specified counseling: Secondary | ICD-10-CM | POA: Insufficient documentation

## 2016-09-26 DIAGNOSIS — E785 Hyperlipidemia, unspecified: Secondary | ICD-10-CM

## 2016-09-26 MED ORDER — SILDENAFIL CITRATE 20 MG PO TABS: 60.0000 mg | ORAL_TABLET | Freq: Every day | ORAL | 11 refills | Status: DC | PRN

## 2016-09-26 NOTE — Progress Notes (Signed)
Subjective:    Patient ID: Ricardo Reese, male    DOB: 1951-03-01, 66 y.o.   MRN: [id number]  HPI Here for initial Medicare wellness visit and follow up of chronic health conditions Reviewed form and advanced directives Reviewed other doctors Smoked for 2 months while in college Enjoys 2 beers most days Hopes to restart exercise now Vision and hearing are fine No falls No depression or anhedonia Independent with instrumental ADLs No problems with memory  Just had THR last week Doing very well Walking without even a cane  BP has been running high since being in the hospital Had been fine before No headaches No chest pain or SOB No dizziness or syncope No edema Plans to start exercising since retiring --once hip is better  Keeps up with dermatologist No recurrence of melanoma  On statin No myalgias or GI problems  Current Outpatient Prescriptions on File Prior to Visit  Medication Sig Dispense Refill  . acetaminophen (TYLENOL) 650 MG CR tablet Take 1,300 mg by mouth every 8 (eight) hours.    Marland Kitchen aspirin 81 MG chewable tablet Chew 1 tablet (81 mg total) by mouth 2 (two) times daily. 35 tablet 0  . B Complex-C (SUPER B COMPLEX PO) Take by mouth daily.    . fish oil-omega-3 fatty acids 1000 MG capsule Take 1 g by mouth daily.    Marland Kitchen losartan-hydrochlorothiazide (HYZAAR) 100-25 MG tablet Take 1 tablet by mouth daily. 90 tablet 0  . methocarbamol (ROBAXIN) 500 MG tablet Take 1 tablet (500 mg total) by mouth every 6 (six) hours as needed for muscle spasms. 40 tablet 1  . oxyCODONE-acetaminophen (ROXICET) 5-325 MG tablet Take 1-2 tablets by mouth every 4 (four) hours as needed for severe pain. 60 tablet 0  . pravastatin (PRAVACHOL) 40 MG tablet Take 1 tablet (40 mg total) by mouth daily. (Patient taking differently: Take 40 mg by mouth at bedtime. ) 90 tablet 0  . sildenafil (REVATIO) 20 MG tablet Take 3-5 tablets (60-100 mg total) by mouth daily as needed. (Patient taking  differently: Take 60-100 mg by mouth daily as needed (erectile dysfunction). ) 50 tablet 11   No current facility-administered medications on file prior to visit.     Allergies  Allergen Reactions  . No Known Allergies     Past Medical History:  Diagnosis Date  . Cancer ([place])    basal cell- face  . History of kidney stones    has had twice  . Hyperlipidemia   . Hypertension   . Impaired fasting glucose   . Kidney stone 1983  . 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 Pneumonia    walking back in 2002    Past Surgical History:  Procedure Laterality Date  . KNEE SURGERY  1969   cartilage removal right knee   . melanoma back  09/2010  . 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   Procedure: LEFT TOTAL HIP ARTHROPLASTY ANTERIOR APPROACH;  Surgeon: Mcarthur Rossetti, MD;  Location: [place];  Service: Orthopedics;  Laterality: Left;    Family History  Problem Relation Age of Onset  . Stroke Father   . Heart disease Brother   . Diabetes Neg Hx   . Colon cancer Neg Hx   . Stomach cancer Neg Hx   . Esophageal cancer Neg Hx   . Rectal cancer Neg Hx     Social History   Social History  .  Marital status: Married    Spouse name: N/A  . Number of children: 2  . Years of education: N/A   Occupational History  . Store Leisure centre manager    Retired   Social History Main Topics  . Smoking status: Never Smoker  . Smokeless tobacco: Never Used  . Alcohol use Yes     Comment: occasional beer  . Drug use: No  . Sexual activity: Not on file   Other Topics Concern  . Not on file   Social History Narrative   Has living will   Wife is health care POA   Would accept resuscitation   No tube feeds if cognitively unaware   Review of Systems Sleeps better now Appetite is fine Wears seat belt Teeth are fine--keeps up with dentist (Dr Archie Balboa retired--has new one) No heartburn or dysphagia Bowels are fine now ---some constipation  with the oxycodone Voids fine--no flow problems, etc Satisfied with sildenafil No other joint problems No rash or suspicious skin lesions    Objective:   Physical Exam  Constitutional: He is oriented to person, place, and time. He appears well-nourished. No distress.  HENT:  Mouth/Throat: Oropharynx is clear and moist. No oropharyngeal exudate.  Neck: No thyromegaly present.  Cardiovascular: Normal rate, regular rhythm, normal heart sounds and intact distal pulses.  Exam reveals no gallop.   No murmur heard. Pulmonary/Chest: Effort normal and breath sounds normal. No respiratory distress. He has no wheezes. He has no rales.  Abdominal: Soft. There is no tenderness.  Musculoskeletal: He exhibits no edema or tenderness.  Lymphadenopathy:    He has no cervical adenopathy.  Neurological: He is alert and oriented to person, place, and time.  President--- "Daisy Floro, Barack Quay Burow" [phone] [id number] Recall 3/3  Skin: No rash noted. No erythema.  Psychiatric: He has a normal mood and affect. His behavior is normal.          Assessment & Plan:

## 2016-09-26 NOTE — Assessment & Plan Note (Signed)
BP Readings from Last 3 Encounters:  09/26/16 (!) 144/76  09/22/16 (!) 169/80  09/15/16 (!) (P) 168/80   Had been lower but up since surgery Will work on fitness No med changes for now

## 2016-09-26 NOTE — Progress Notes (Signed)
Pre visit review using our clinic review tool, if applicable. No additional management support is needed unless otherwise documented below in the visit note. ann

## 2016-09-26 NOTE — Telephone Encounter (Signed)
LMOM of the below message for [facility]

## 2016-09-26 NOTE — Assessment & Plan Note (Signed)
Keeps up with the dermatologist 

## 2016-09-26 NOTE — Assessment & Plan Note (Signed)
See social history 

## 2016-09-26 NOTE — Patient Instructions (Signed)
DASH Eating Plan DASH stands for "Dietary Approaches to Stop Hypertension." The DASH eating plan is a healthy eating plan that has been shown to reduce high blood pressure (hypertension). It may also reduce your risk for type 2 diabetes, heart disease, and stroke. The DASH eating plan may also help with weight loss. What are tips for following this plan? General guidelines  Avoid eating more than 2,300 mg (milligrams) of salt (sodium) a day. If you have hypertension, you may need to reduce your sodium intake to 1,500 mg a day.  Limit alcohol intake to no more than 1 drink a day for nonpregnant women and 2 drinks a day for men. One drink equals 12 oz of beer, 5 oz of wine, or 1 oz of hard liquor.  Work with your health care provider to maintain a healthy body weight or to lose weight. Ask what an ideal weight is for you.  Get at least 30 minutes of exercise that causes your heart to beat faster (aerobic exercise) most days of the week. Activities may include walking, swimming, or biking.  Work with your health care provider or diet and nutrition specialist (dietitian) to adjust your eating plan to your individual calorie needs. Reading food labels  Check food labels for the amount of sodium per serving. Choose foods with less than 5 percent of the Daily Value of sodium. Generally, foods with less than 300 mg of sodium per serving fit into this eating plan.  To find whole grains, look for the word "whole" as the first word in the ingredient list. Shopping  Buy products labeled as "low-sodium" or "no salt added."  Buy fresh foods. Avoid canned foods and premade or frozen meals. Cooking  Avoid adding salt when cooking. Use salt-free seasonings or herbs instead of table salt or sea salt. Check with your health care provider or pharmacist before using salt substitutes.  Do not fry foods. Cook foods using healthy methods such as baking, boiling, grilling, and broiling instead.  Cook with  heart-healthy oils, such as olive, canola, soybean, or sunflower oil. Meal planning   Eat a balanced diet that includes: ? 5 or more servings of fruits and vegetables each day. At each meal, try to fill half of your plate with fruits and vegetables. ? Up to 6-8 servings of whole grains each day. ? Less than 6 oz of lean meat, poultry, or fish each day. A 3-oz serving of meat is about the same size as a deck of cards. One egg equals 1 oz. ? 2 servings of low-fat dairy each day. ? A serving of nuts, seeds, or beans 5 times each week. ? Heart-healthy fats. Healthy fats called Omega-3 fatty acids are found in foods such as flaxseeds and coldwater fish, like sardines, salmon, and mackerel.  Limit how much you eat of the following: ? Canned or prepackaged foods. ? Food that is high in trans fat, such as fried foods. ? Food that is high in saturated fat, such as fatty meat. ? Sweets, desserts, sugary drinks, and other foods with added sugar. ? Full-fat dairy products.  Do not salt foods before eating.  Try to eat at least 2 vegetarian meals each week.  Eat more home-cooked food and less restaurant, buffet, and fast food.  When eating at a restaurant, ask that your food be prepared with less salt or no salt, if possible. What foods are recommended? The items listed may not be a complete list. Talk with your dietitian about what   dietary choices are best for you. Grains Whole-grain or whole-wheat bread. Whole-grain or whole-wheat pasta. Brown rice. Oatmeal. Quinoa. Bulgur. Whole-grain and low-sodium cereals. Pita bread. Low-fat, low-sodium crackers. Whole-wheat flour tortillas. Vegetables Fresh or frozen vegetables (raw, steamed, roasted, or grilled). Low-sodium or reduced-sodium tomato and vegetable juice. Low-sodium or reduced-sodium tomato sauce and tomato paste. Low-sodium or reduced-sodium canned vegetables. Fruits All fresh, dried, or frozen fruit. Canned fruit in natural juice (without  added sugar). Meat and other protein foods Skinless chicken or turkey. Ground chicken or turkey. Pork with fat trimmed off. Fish and seafood. Egg whites. Dried beans, peas, or lentils. Unsalted nuts, nut butters, and seeds. Unsalted canned beans. Lean cuts of beef with fat trimmed off. Low-sodium, lean deli meat. Dairy Low-fat (1%) or fat-free (skim) milk. Fat-free, low-fat, or reduced-fat cheeses. Nonfat, low-sodium ricotta or cottage cheese. Low-fat or nonfat yogurt. Low-fat, low-sodium cheese. Fats and oils Soft margarine without trans fats. Vegetable oil. Low-fat, reduced-fat, or light mayonnaise and salad dressings (reduced-sodium). Canola, safflower, olive, soybean, and sunflower oils. Avocado. Seasoning and other foods Herbs. Spices. Seasoning mixes without salt. Unsalted popcorn and pretzels. Fat-free sweets. What foods are not recommended? The items listed may not be a complete list. Talk with your dietitian about what dietary choices are best for you. Grains Baked goods made with fat, such as croissants, muffins, or some breads. Dry pasta or rice meal packs. Vegetables Creamed or fried vegetables. Vegetables in a cheese sauce. Regular canned vegetables (not low-sodium or reduced-sodium). Regular canned tomato sauce and paste (not low-sodium or reduced-sodium). Regular tomato and vegetable juice (not low-sodium or reduced-sodium). Pickles. Olives. Fruits Canned fruit in a light or heavy syrup. Fried fruit. Fruit in cream or butter sauce. Meat and other protein foods Fatty cuts of meat. Ribs. Fried meat. Bacon. Sausage. Bologna and other processed lunch meats. Salami. Fatback. Hotdogs. Bratwurst. Salted nuts and seeds. Canned beans with added salt. Canned or smoked fish. Whole eggs or egg yolks. Chicken or turkey with skin. Dairy Whole or 2% milk, cream, and half-and-half. Whole or full-fat cream cheese. Whole-fat or sweetened yogurt. Full-fat cheese. Nondairy creamers. Whipped toppings.  Processed cheese and cheese spreads. Fats and oils Butter. Stick margarine. Lard. Shortening. Ghee. Bacon fat. Tropical oils, such as coconut, palm kernel, or palm oil. Seasoning and other foods Salted popcorn and pretzels. Onion salt, garlic salt, seasoned salt, table salt, and sea salt. Worcestershire sauce. Tartar sauce. Barbecue sauce. Teriyaki sauce. Soy sauce, including reduced-sodium. Steak sauce. Canned and packaged gravies. Fish sauce. Oyster sauce. Cocktail sauce. Horseradish that you find on the shelf. Ketchup. Mustard. Meat flavorings and tenderizers. Bouillon cubes. Hot sauce and Tabasco sauce. Premade or packaged marinades. Premade or packaged taco seasonings. Relishes. Regular salad dressings. Where to find more information:  National Heart, Lung, and Blood Institute: www.nhlbi.nih.gov  American Heart Association: www.heart.org Summary  The DASH eating plan is a healthy eating plan that has been shown to reduce high blood pressure (hypertension). It may also reduce your risk for type 2 diabetes, heart disease, and stroke.  With the DASH eating plan, you should limit salt (sodium) intake to 2,300 mg a day. If you have hypertension, you may need to reduce your sodium intake to 1,500 mg a day.  When on the DASH eating plan, aim to eat more fresh fruits and vegetables, whole grains, lean proteins, low-fat dairy, and heart-healthy fats.  Work with your health care provider or diet and nutrition specialist (dietitian) to adjust your eating plan to your individual   calorie needs. This information is not intended to replace advice given to you by your health care provider. Make sure you discuss any questions you have with your health care provider. Document Released: 05/19/2011 Document Revised: 05/23/2016 Document Reviewed: 05/23/2016 Elsevier Interactive Patient Education  2017 Elsevier Inc.  

## 2016-09-26 NOTE — Assessment & Plan Note (Signed)
No problems with the statin for primary prevention Recent LFTs fine

## 2016-09-26 NOTE — Assessment & Plan Note (Signed)
I have personally reviewed the Medicare Annual Wellness questionnaire and have noted 1. The patient's medical and social history 2. Their use of alcohol, tobacco or illicit drugs 3. Their current medications and supplements 4. The patient's functional ability including ADL's, fall risks, home safety risks and hearing or visual             impairment. 5. Diet and physical activities 6. Evidence for depression or mood disorders  The patients weight, height, BMI and visual acuity have been recorded in the chart I have made referrals, counseling and provided education to the patient based review of the above and I have provided the pt with a written personalized care plan for preventive services.  I have provided you with a copy of your personalized plan for preventive services. Please take the time to review along with your updated medication list.  Will give prevnar Needs to work on fitness Colon due 2023 Will check PSA

## 2016-09-26 NOTE — Assessment & Plan Note (Signed)
Recent sugar 107. Will recheck next year---he is going to attend to fitness

## 2016-09-27 ENCOUNTER — Telehealth: Payer: Self-pay | Admitting: *Deleted

## 2016-09-27 NOTE — Telephone Encounter (Signed)
Spoke to pt who states, he is not opposed to waiting until next year

## 2016-09-27 NOTE — Telephone Encounter (Signed)
-----   Message from Venia Carbon, MD sent at 09/26/2016  5:47 PM EDT ----- Please call him Let him know that I forgot to order the PSA. We can probably just wait till next year--but find out if he prefers to get it now (he would need to come back for another blood draw---all my fault, tell him I'm sorry)  ----- Message ----- From: Ellamae Sia Sent: 09/26/2016   4:32 PM To: Venia Carbon, MD  No, we can't add, T ----- Message ----- From: Venia Carbon, MD Sent: 09/26/2016   4:04 PM To: Ellamae Sia  I think I forgot to order Medicare PSA Can we add this?  Ricardo Reese

## 2016-09-27 NOTE — Telephone Encounter (Signed)
Okay---we will just plan for next year

## 2016-10-04 ENCOUNTER — Encounter (INDEPENDENT_AMBULATORY_CARE_PROVIDER_SITE_OTHER): Payer: Self-pay | Admitting: Orthopaedic Surgery

## 2016-10-04 ENCOUNTER — Ambulatory Visit (INDEPENDENT_AMBULATORY_CARE_PROVIDER_SITE_OTHER): Payer: Medicare Other | Admitting: Physician Assistant

## 2016-10-04 VITALS — Ht 69.75 in | Wt 233.0 lb

## 2016-10-04 DIAGNOSIS — Z96642 Presence of left artificial hip joint: Secondary | ICD-10-CM

## 2016-10-04 NOTE — Progress Notes (Signed)
Office Visit Note   Patient: Ricardo Reese           Date of Birth: 08-19-1950           MRN: [id number] Visit Date: 10/04/2016              Requested by: Ricardo Carbon, MD Ricardo Reese, Ricardo Reese [id number] PCP: Ricardo Simpler, MD   Assessment & Plan: Visit Diagnoses:  1. Status post total replacement of left hip     Plan:Follow-up in one month. Scar tissue mobilization encouraged. Moist heat to the hip to help resolve the hematoma. Hip seroma/hematoma aspirated today total of 25 mL bloody aspirate obtained. Patient tolerated well. He will go on his aspirin 325 mg once a day for the next week and then return to taking his 81 mg aspirin once daily  Follow-Up Instructions: Return in about 4 weeks (around 11/01/2016).   Orders:  No orders of the defined types were placed in this encounter.  No orders of the defined types were placed in this encounter.     Procedures: No procedures performed   Clinical Data: No additional findings.   Subjective: Chief Complaint  Patient presents with  . Left Hip - Routine Post Op    2 weeks post op Lt THA. Doing well, walking great. He is taking aspirin BID. He has discontinued pain medication and muscle relaxer.     HPI Ricardo Reese is 2 weeks status post left total hip arthroplasty. He is overall doing well. He denies any chest pain shortness breath fevers chills. Taking aspirin 325 twice daily.  Review of Systems Denies chest pain shortness breath fevers or chills  Objective: Vital Signs: Ht 5' 9.75" (1.772 m)   Wt 233 lb (105.7 kg)   BMI 33.67 kg/m   Physical Exam Well-developed well-nourished male in no acute distress. Affect appropriate Ortho Exam Ambulates without assistive devices. Left hip incisions healing well no signs of infection. He is a obvious seroma. Calf supple nontender. Dorsiflexion plantar flexion ankle intact Specialty Comments:  No specialty comments available.  Imaging: No results  found.   PMFS History: Patient Active Problem List   Diagnosis Date Noted  . Advance directive discussed with patient 09/26/2016  . Unilateral primary osteoarthritis, left hip 09/20/2016  . Status post left hip replacement 09/20/2016  . Left hip pain 06/10/2016  . Routine general medical examination at a health care facility 08/17/2011  . History of melanoma   . Hyperlipemia 05/20/2008  . Benign essential hypertension 05/20/2008  . IMPAIRED FASTING GLUCOSE 05/20/2008   Past Medical History:  Diagnosis Date  . Cancer ([place])    basal cell- face  . History of kidney stones    has had twice  . Hyperlipidemia   . Hypertension   . Impaired fasting glucose   . Kidney stone 1983  . 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 Pneumonia    walking back in 2002    Family History  Problem Relation Age of Onset  . Stroke Father   . Heart disease Brother   . Diabetes Neg Hx   . Colon cancer Neg Hx   . Stomach cancer Neg Hx   . Esophageal cancer Neg Hx   . Rectal cancer Neg Hx     Past Surgical History:  Procedure Laterality Date  . KNEE SURGERY  1969   cartilage removal right knee   . melanoma back  09/2010  . TOTAL  HIP ARTHROPLASTY Left 09/20/2016  . 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   Procedure: LEFT TOTAL HIP ARTHROPLASTY ANTERIOR APPROACH;  Surgeon: Mcarthur Rossetti, MD;  Location: [place];  Service: Orthopedics;  Laterality: Left;   Social History   Occupational History  . Store Leisure centre manager    Retired   Social History Main Topics  . Smoking status: Never Smoker  . Smokeless tobacco: Never Used  . Alcohol use Yes     Comment: occasional beer  . Drug use: No  . Sexual activity: Not on file

## 2016-10-05 DIAGNOSIS — L570 Actinic keratosis: Secondary | ICD-10-CM | POA: Diagnosis not present

## 2016-10-05 DIAGNOSIS — X32XXXA Exposure to sunlight, initial encounter: Secondary | ICD-10-CM | POA: Diagnosis not present

## 2016-10-05 DIAGNOSIS — L728 Other follicular cysts of the skin and subcutaneous tissue: Secondary | ICD-10-CM | POA: Diagnosis not present

## 2016-10-05 DIAGNOSIS — Z85820 Personal history of malignant melanoma of skin: Secondary | ICD-10-CM | POA: Diagnosis not present

## 2016-10-05 DIAGNOSIS — Z08 Encounter for follow-up examination after completed treatment for malignant neoplasm: Secondary | ICD-10-CM | POA: Diagnosis not present

## 2016-10-14 ENCOUNTER — Other Ambulatory Visit (INDEPENDENT_AMBULATORY_CARE_PROVIDER_SITE_OTHER): Payer: Self-pay | Admitting: Orthopaedic Surgery

## 2016-10-19 ENCOUNTER — Telehealth (INDEPENDENT_AMBULATORY_CARE_PROVIDER_SITE_OTHER): Payer: Self-pay | Admitting: Physician Assistant

## 2016-10-19 NOTE — Telephone Encounter (Signed)
PT DENTIST CALLING TO FIND OUT IF PT NEEDS PRE MED AND WHAT KIND TO GIVE HIM.  [place]

## 2016-10-19 NOTE — Telephone Encounter (Signed)
Only needed for procedures, not for routine cleaning. No answer when I tried to call, will call again later.

## 2016-10-20 MED ORDER — AMOXICILLIN 500 MG PO TABS: ORAL_TABLET | ORAL | 3 refills | Status: DC

## 2016-10-20 NOTE — Telephone Encounter (Signed)
Amoxicillin 500 mg, 2 pills one hour prior to procedure and 2 pills 6 hours after.

## 2016-10-20 NOTE — Addendum Note (Signed)
Addended by: Maxcine Ham on: 10/20/2016 11:22 AM   Modules accepted: Orders

## 2016-10-20 NOTE — Telephone Encounter (Signed)
I called and spoke with patient to advise rx for amoxicillin sent to pharmacy. Also reschedule appointment due to scheduling conflicts.

## 2016-11-02 ENCOUNTER — Ambulatory Visit (INDEPENDENT_AMBULATORY_CARE_PROVIDER_SITE_OTHER): Payer: Medicare Other | Admitting: Physician Assistant

## 2016-11-02 DIAGNOSIS — Z96642 Presence of left artificial hip joint: Secondary | ICD-10-CM

## 2016-11-02 NOTE — Progress Notes (Addendum)
[name] returns today for follow-up of his left total hip arthroplasty. He underwent a left total hip arthroplasty on 09/20/2016 and is doing very well. He has no complaints. He is ready to get back playing golf.  Left hip: His excellent range of motion of the hip without pain. Surgical incisions well-healed no signs of infection.  Plan: He'll continue scar tissue mobilization. Activities as tolerated. Follow with [place] at 1 year postop and at that point time we'll obtain AP pelvis and a lateral view of his left hip.

## 2016-11-08 ENCOUNTER — Ambulatory Visit (INDEPENDENT_AMBULATORY_CARE_PROVIDER_SITE_OTHER): Payer: Medicare Other | Admitting: Orthopaedic Surgery

## 2016-11-11 NOTE — Addendum Note (Signed)
Addendum  created 11/11/16 1333 by Rica Koyanagi, MD   Sign clinical note

## 2016-11-13 ENCOUNTER — Other Ambulatory Visit: Payer: Self-pay | Admitting: Internal Medicine

## 2017-06-13 DIAGNOSIS — C61 Malignant neoplasm of prostate: Secondary | ICD-10-CM

## 2017-06-13 HISTORY — DX: Malignant neoplasm of prostate: C61

## 2017-08-19 ENCOUNTER — Other Ambulatory Visit: Payer: Self-pay | Admitting: Internal Medicine

## 2017-10-04 ENCOUNTER — Encounter: Payer: Self-pay | Admitting: Internal Medicine

## 2017-10-04 ENCOUNTER — Ambulatory Visit (INDEPENDENT_AMBULATORY_CARE_PROVIDER_SITE_OTHER): Payer: Medicare Other | Admitting: Internal Medicine

## 2017-10-04 VITALS — BP 170/80 | HR 75 | Temp 97.9°F | Ht 69.5 in | Wt 249.0 lb

## 2017-10-04 DIAGNOSIS — Z0001 Encounter for general adult medical examination with abnormal findings: Secondary | ICD-10-CM | POA: Diagnosis not present

## 2017-10-04 DIAGNOSIS — R7301 Impaired fasting glucose: Secondary | ICD-10-CM | POA: Diagnosis not present

## 2017-10-04 DIAGNOSIS — Z0000 Encounter for general adult medical examination without abnormal findings: Secondary | ICD-10-CM

## 2017-10-04 DIAGNOSIS — Z7189 Other specified counseling: Secondary | ICD-10-CM

## 2017-10-04 DIAGNOSIS — Z23 Encounter for immunization: Secondary | ICD-10-CM | POA: Diagnosis not present

## 2017-10-04 DIAGNOSIS — Z125 Encounter for screening for malignant neoplasm of prostate: Secondary | ICD-10-CM

## 2017-10-04 DIAGNOSIS — R350 Frequency of micturition: Secondary | ICD-10-CM

## 2017-10-04 DIAGNOSIS — E785 Hyperlipidemia, unspecified: Secondary | ICD-10-CM | POA: Diagnosis not present

## 2017-10-04 DIAGNOSIS — I10 Essential (primary) hypertension: Secondary | ICD-10-CM | POA: Diagnosis not present

## 2017-10-04 LAB — COMPREHENSIVE METABOLIC PANEL
ALT: 22 U/L (ref 0–53)
AST: 20 U/L (ref 0–37)
Albumin: 4.4 g/dL (ref 3.5–5.2)
Alkaline Phosphatase: 65 U/L (ref 39–117)
BUN: 18 mg/dL (ref 6–23)
CHLORIDE: 102 meq/L (ref 96–112)
CO2: 28 meq/L (ref 19–32)
CREATININE: 0.86 mg/dL (ref 0.40–1.50)
Calcium: 9.7 mg/dL (ref 8.4–10.5)
GFR: 94.48 mL/min (ref >60.00)
GLUCOSE: 147 mg/dL — AB (ref 70–99)
Potassium: 4.8 mEq/L (ref 3.5–5.1)
SODIUM: 137 meq/L (ref 135–145)
Total Bilirubin: 0.5 mg/dL (ref 0.2–1.2)
Total Protein: 6.9 g/dL (ref 6.0–8.3)

## 2017-10-04 LAB — LIPID PANEL
CHOL/HDL RATIO: 4
Cholesterol: 207 mg/dL — ABNORMAL HIGH (ref 0–200)
HDL: 51.9 mg/dL (ref >39.00)
LDL CALC: 124 mg/dL — AB (ref 0–99)
NonHDL: 155.01
Triglycerides: 155 mg/dL — ABNORMAL HIGH (ref 0.0–149.0)
VLDL: 31 mg/dL (ref 0.0–40.0)

## 2017-10-04 LAB — CBC
HCT: 43.4 % (ref 39.0–52.0)
Hemoglobin: 14.9 g/dL (ref 13.0–17.0)
MCHC: 34.3 g/dL (ref 30.0–36.0)
MCV: 86.6 fl (ref 78.0–100.0)
Platelets: 264 10*3/uL (ref 150.0–400.0)
RBC: 5.02 Mil/uL (ref 4.22–5.81)
RDW: 14.4 % (ref 11.5–15.5)
WBC: 6.4 10*3/uL (ref 4.0–10.5)

## 2017-10-04 LAB — HEMOGLOBIN A1C: HEMOGLOBIN A1C: 7.7 % — AB (ref 4.6–6.5)

## 2017-10-04 LAB — T4, FREE: Free T4: 0.6 ng/dL (ref 0.60–1.60)

## 2017-10-04 LAB — PSA: PSA: 21.9 ng/mL — AB (ref 0.10–4.00)

## 2017-10-04 MED ORDER — AMLODIPINE BESYLATE 5 MG PO TABS: 5.0000 mg | ORAL_TABLET | Freq: Every day | ORAL | 3 refills | Status: DC

## 2017-10-04 MED ORDER — PRAVASTATIN SODIUM 40 MG PO TABS: 40.0000 mg | ORAL_TABLET | Freq: Every day | ORAL | 3 refills | Status: DC

## 2017-10-04 MED ORDER — LOSARTAN POTASSIUM-HCTZ 100-25 MG PO TABS: 1.0000 | ORAL_TABLET | Freq: Every day | ORAL | 3 refills | Status: DC

## 2017-10-04 MED ORDER — SILDENAFIL CITRATE 20 MG PO TABS: 60.0000 mg | ORAL_TABLET | Freq: Every day | ORAL | 11 refills | Status: DC | PRN

## 2017-10-04 NOTE — Progress Notes (Signed)
Subjective:    Patient ID: Ricardo Reese, male    DOB: May 06, 1951, 67 y.o.   MRN: [id number]  HPI Here for Medicare wellness visit and follow up of chronic health conditions Reviewed form and advanced directives Reviewed other doctors No tobacco Still enjoys regular beer---rare bourbon Vision is fine. Hearing is fine No falls No depression or anhedonia Some exercise---not much Independent with instrumental ADLs No memory issues  Having trouble with his neck Known trouble that goes back 10 years Sleeps with orthopedic pillow Now has some trouble with his left hand---will go to sleep Only middle 3 fingers---discussed CTS (usually at night) Some swelling lately---but may have had bug bite after recent trip to [place] 16# since last year Working on increasing exercise and trying to watch his eating No excessive thirst or urination  Hasn't been checking BP No chest pain or SOB No palpitations No dizziness or syncope No edema  Still on statin No myalgia or GI problems  Current Outpatient Medications on File Prior to Visit  Medication Sig Dispense Refill  . acetaminophen (TYLENOL) 650 MG CR tablet Take 1,300 mg by mouth every 8 (eight) hours.    Marland Kitchen aspirin 81 MG chewable tablet Chew 1 tablet (81 mg total) by mouth 2 (two) times daily. 35 tablet 0  . B Complex-C (SUPER B COMPLEX PO) Take by mouth daily.    . fish oil-omega-3 fatty acids 1000 MG capsule Take 1 g by mouth daily.     No current facility-administered medications on file prior to visit.     Allergies  Allergen Reactions  . No Known Allergies     Past Medical History:  Diagnosis Date  . Cancer ([place])    basal cell- face  . History of kidney stones    has had twice  . Hyperlipidemia   . Hypertension   . Impaired fasting glucose   . Kidney stone 1983  . 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 Pneumonia    walking back in 2002    Past Surgical History:  Procedure Laterality Date  .  KNEE SURGERY  1969   cartilage removal right knee   . melanoma back  09/2010  . 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 Stroke Father   . Heart disease Brother   . Diabetes Neg Hx   . Colon cancer Neg Hx   . Stomach cancer Neg Hx   . Esophageal cancer Neg Hx   . Rectal cancer Neg Hx     Social History   Socioeconomic History  . Marital status: Married    Spouse name: Not on file  . Number of children: 2  . Years of education: Not on file  . Highest education level: Not on file  Occupational History  . 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 Financial resource strain: Not on file  . Food insecurity:    Worry: Not on file    Inability: Not on file  . Transportation needs:    Medical: Not on file    Non-medical: Not on file  Tobacco Use  . Smoking status: Never Smoker  . Smokeless tobacco: Never  Used  Substance and Sexual Activity  . Alcohol use: Yes    Comment: occasional beer  . Drug use: No  . Sexual activity: Not on file  Lifestyle  . Physical activity:    Days per week: Not on file    Minutes per session: Not on file  . Stress: Not on file  Relationships  . Social connections:    Talks on phone: Not on file    Gets together: Not on file    Attends religious service: Not on file    Active member of club or organization: Not on file    Attends meetings of clubs or organizations: Not on file    Relationship status: Not on file  . Intimate partner violence:    Fear of current or ex partner: Not on file    Emotionally abused: Not on file    Physically abused: Not on file    Forced sexual activity: Not on file  Other Topics Concern  . Not on file  Social History  Narrative   Has living will   Wife is health care POA--- daughters next   Would accept resuscitation   No tube feeds if cognitively unaware   Review of Systems No headaches No problems with appetite Sleeps fairly well---only 4-5 hours per night (same for decades) Wears seat belt Teeth okay---keeps up with dentist Keeps up with dermatologist--no worrisome lesions now Bowels are fine. No blood Urinary stream is fine. No nocturia.  Satisfied with sildenafil No heartburn or dysphagia     Objective:   Physical Exam  Constitutional: He is oriented to person, place, and time. He appears well-developed. No distress.  HENT:  Mouth/Throat: Oropharynx is clear and moist. No oropharyngeal exudate.  Neck: No thyromegaly present.  Cardiovascular: Normal rate, regular rhythm, normal heart sounds and intact distal pulses. Exam reveals no gallop.  No murmur heard. Pulmonary/Chest: Effort normal and breath sounds normal. No respiratory distress. He has no wheezes. He has no rales.  Abdominal: Soft. There is no tenderness.  Musculoskeletal: He exhibits no edema or tenderness.  Lymphadenopathy:    He has no cervical adenopathy.  Neurological: He is alert and oriented to person, place, and time.  President--- "Daisy Floro, Barack Abbe [place] Bush" [id number] Recall 3/3  Skin: No rash noted. No erythema.  Psychiatric: He has a normal mood and affect. His behavior is normal.          Assessment & Plan:

## 2017-10-04 NOTE — Assessment & Plan Note (Signed)
BP Readings from Last 3 Encounters:  10/04/17 (!) 170/80  09/26/16 (!) 144/76  09/22/16 (!) 169/80   Out of control Will add amlodipine See back in 1 month

## 2017-10-04 NOTE — Assessment & Plan Note (Signed)
Hopefully not diabetic now Consider metformin even if not

## 2017-10-04 NOTE — Assessment & Plan Note (Signed)
Tolerates the primary prevention statin

## 2017-10-04 NOTE — Addendum Note (Signed)
Addended by: Elmon Kirschner A on: 10/04/2017 09:34 AM   Modules accepted: Orders

## 2017-10-04 NOTE — Assessment & Plan Note (Signed)
See social history 

## 2017-10-04 NOTE — Patient Instructions (Signed)
DASH Eating Plan DASH stands for "Dietary Approaches to Stop Hypertension." The DASH eating plan is a healthy eating plan that has been shown to reduce high blood pressure (hypertension). It may also reduce your risk for type 2 diabetes, heart disease, and stroke. The DASH eating plan may also help with weight loss. What are tips for following this plan? General guidelines  Avoid eating more than 2,300 mg (milligrams) of salt (sodium) a day. If you have hypertension, you may need to reduce your sodium intake to 1,500 mg a day.  Limit alcohol intake to no more than 1 drink a day for nonpregnant women and 2 drinks a day for men. One drink equals 12 oz of beer, 5 oz of wine, or 1 oz of hard liquor.  Work with your health care provider to maintain a healthy body weight or to lose weight. Ask what an ideal weight is for you.  Get at least 30 minutes of exercise that causes your heart to beat faster (aerobic exercise) most days of the week. Activities may include walking, swimming, or biking.  Work with your health care provider or diet and nutrition specialist (dietitian) to adjust your eating plan to your individual calorie needs. Reading food labels  Check food labels for the amount of sodium per serving. Choose foods with less than 5 percent of the Daily Value of sodium. Generally, foods with less than 300 mg of sodium per serving fit into this eating plan.  To find whole grains, look for the word "whole" as the first word in the ingredient list. Shopping  Buy products labeled as "low-sodium" or "no salt added."  Buy fresh foods. Avoid canned foods and premade or frozen meals. Cooking  Avoid adding salt when cooking. Use salt-free seasonings or herbs instead of table salt or sea salt. Check with your health care provider or pharmacist before using salt substitutes.  Do not fry foods. Cook foods using healthy methods such as baking, boiling, grilling, and broiling instead.  Cook with  heart-healthy oils, such as olive, canola, soybean, or sunflower oil. Meal planning   Eat a balanced diet that includes: ? 5 or more servings of fruits and vegetables each day. At each meal, try to fill half of your plate with fruits and vegetables. ? Up to 6-8 servings of whole grains each day. ? Less than 6 oz of lean meat, poultry, or fish each day. A 3-oz serving of meat is about the same size as a deck of cards. One egg equals 1 oz. ? 2 servings of low-fat dairy each day. ? A serving of nuts, seeds, or beans 5 times each week. ? Heart-healthy fats. Healthy fats called Omega-3 fatty acids are found in foods such as flaxseeds and coldwater fish, like sardines, salmon, and mackerel.  Limit how much you eat of the following: ? Canned or prepackaged foods. ? Food that is high in trans fat, such as fried foods. ? Food that is high in saturated fat, such as fatty meat. ? Sweets, desserts, sugary drinks, and other foods with added sugar. ? Full-fat dairy products.  Do not salt foods before eating.  Try to eat at least 2 vegetarian meals each week.  Eat more home-cooked food and less restaurant, buffet, and fast food.  When eating at a restaurant, ask that your food be prepared with less salt or no salt, if possible. What foods are recommended? The items listed may not be a complete list. Talk with your dietitian about what   dietary choices are best for you. Grains Whole-grain or whole-wheat bread. Whole-grain or whole-wheat pasta. Brown rice. Oatmeal. Quinoa. Bulgur. Whole-grain and low-sodium cereals. Pita bread. Low-fat, low-sodium crackers. Whole-wheat flour tortillas. Vegetables Fresh or frozen vegetables (raw, steamed, roasted, or grilled). Low-sodium or reduced-sodium tomato and vegetable juice. Low-sodium or reduced-sodium tomato sauce and tomato paste. Low-sodium or reduced-sodium canned vegetables. Fruits All fresh, dried, or frozen fruit. Canned fruit in natural juice (without  added sugar). Meat and other protein foods Skinless chicken or turkey. Ground chicken or turkey. Pork with fat trimmed off. Fish and seafood. Egg whites. Dried beans, peas, or lentils. Unsalted nuts, nut butters, and seeds. Unsalted canned beans. Lean cuts of beef with fat trimmed off. Low-sodium, lean deli meat. Dairy Low-fat (1%) or fat-free (skim) milk. Fat-free, low-fat, or reduced-fat cheeses. Nonfat, low-sodium ricotta or cottage cheese. Low-fat or nonfat yogurt. Low-fat, low-sodium cheese. Fats and oils Soft margarine without trans fats. Vegetable oil. Low-fat, reduced-fat, or light mayonnaise and salad dressings (reduced-sodium). Canola, safflower, olive, soybean, and sunflower oils. Avocado. Seasoning and other foods Herbs. Spices. Seasoning mixes without salt. Unsalted popcorn and pretzels. Fat-free sweets. What foods are not recommended? The items listed may not be a complete list. Talk with your dietitian about what dietary choices are best for you. Grains Baked goods made with fat, such as croissants, muffins, or some breads. Dry pasta or rice meal packs. Vegetables Creamed or fried vegetables. Vegetables in a cheese sauce. Regular canned vegetables (not low-sodium or reduced-sodium). Regular canned tomato sauce and paste (not low-sodium or reduced-sodium). Regular tomato and vegetable juice (not low-sodium or reduced-sodium). Pickles. Olives. Fruits Canned fruit in a light or heavy syrup. Fried fruit. Fruit in cream or butter sauce. Meat and other protein foods Fatty cuts of meat. Ribs. Fried meat. Bacon. Sausage. Bologna and other processed lunch meats. Salami. Fatback. Hotdogs. Bratwurst. Salted nuts and seeds. Canned beans with added salt. Canned or smoked fish. Whole eggs or egg yolks. Chicken or turkey with skin. Dairy Whole or 2% milk, cream, and half-and-half. Whole or full-fat cream cheese. Whole-fat or sweetened yogurt. Full-fat cheese. Nondairy creamers. Whipped toppings.  Processed cheese and cheese spreads. Fats and oils Butter. Stick margarine. Lard. Shortening. Ghee. Bacon fat. Tropical oils, such as coconut, palm kernel, or palm oil. Seasoning and other foods Salted popcorn and pretzels. Onion salt, garlic salt, seasoned salt, table salt, and sea salt. Worcestershire sauce. Tartar sauce. Barbecue sauce. Teriyaki sauce. Soy sauce, including reduced-sodium. Steak sauce. Canned and packaged gravies. Fish sauce. Oyster sauce. Cocktail sauce. Horseradish that you find on the shelf. Ketchup. Mustard. Meat flavorings and tenderizers. Bouillon cubes. Hot sauce and Tabasco sauce. Premade or packaged marinades. Premade or packaged taco seasonings. Relishes. Regular salad dressings. Where to find more information:  National Heart, Lung, and Blood Institute: www.nhlbi.nih.gov  American Heart Association: www.heart.org Summary  The DASH eating plan is a healthy eating plan that has been shown to reduce high blood pressure (hypertension). It may also reduce your risk for type 2 diabetes, heart disease, and stroke.  With the DASH eating plan, you should limit salt (sodium) intake to 2,300 mg a day. If you have hypertension, you may need to reduce your sodium intake to 1,500 mg a day.  When on the DASH eating plan, aim to eat more fresh fruits and vegetables, whole grains, lean proteins, low-fat dairy, and heart-healthy fats.  Work with your health care provider or diet and nutrition specialist (dietitian) to adjust your eating plan to your individual   calorie needs. This information is not intended to replace advice given to you by your health care provider. Make sure you discuss any questions you have with your health care provider. Document Released: 05/19/2011 Document Revised: 05/23/2016 Document Reviewed: 05/23/2016 Elsevier Interactive Patient Education  2018 Elsevier Inc.  

## 2017-10-04 NOTE — Assessment & Plan Note (Signed)
I have personally reviewed the Medicare Annual Wellness questionnaire and have noted 1. The patient's medical and social history 2. Their use of alcohol, tobacco or illicit drugs 3. Their current medications and supplements 4. The patient's functional ability including ADL's, fall risks, home safety risks and hearing or visual             impairment. 5. Diet and physical activities 6. Evidence for depression or mood disorders  The patients weight, height, BMI and visual acuity have been recorded in the chart I have made referrals, counseling and provided education to the patient based review of the above and I have provided the pt with a written personalized care plan for preventive services.  I have provided you with a copy of your personalized plan for preventive services. Please take the time to review along with your updated medication list.  Has let himself go---counseled Thinks he got prevnar last year but not in chart--will give today PSA Colon due 2023

## 2017-10-05 ENCOUNTER — Telehealth: Payer: Self-pay | Admitting: Internal Medicine

## 2017-10-05 DIAGNOSIS — E119 Type 2 diabetes mellitus without complications: Secondary | ICD-10-CM

## 2017-10-05 DIAGNOSIS — R972 Elevated prostate specific antigen [PSA]: Secondary | ICD-10-CM

## 2017-10-05 MED ORDER — METFORMIN HCL 500 MG PO TABS: 500.0000 mg | ORAL_TABLET | Freq: Two times a day (BID) | ORAL | 3 refills | Status: DC

## 2017-10-05 NOTE — Telephone Encounter (Signed)
PC to patient for the blood work. He is diabetic--which is not surprising. He has already started eating healthier.  I will start him on metformin bid and make referral for diabetic counseling.  Also has considerably elevated PSA. Highly suggestive of cancer Will set up urgent urology evaluation. We decided on [facility] for this  [place], Please set him up with me for follow up in 3 months. Make sure he picks up and starts metformin. Have him let us know if he has any problems with this (like diarrhea, etc)

## 2017-10-06 NOTE — Telephone Encounter (Addendum)
Spoke to pt. He picked up the metformin yesterday and will start it today. Made appt 01-08-18. He will call Rosaria Ferries to set up the urology referral.

## 2017-10-09 DIAGNOSIS — L821 Other seborrheic keratosis: Secondary | ICD-10-CM | POA: Diagnosis not present

## 2017-10-09 DIAGNOSIS — D2261 Melanocytic nevi of right upper limb, including shoulder: Secondary | ICD-10-CM | POA: Diagnosis not present

## 2017-10-09 DIAGNOSIS — D2262 Melanocytic nevi of left upper limb, including shoulder: Secondary | ICD-10-CM | POA: Diagnosis not present

## 2017-10-09 DIAGNOSIS — D2272 Melanocytic nevi of left lower limb, including hip: Secondary | ICD-10-CM | POA: Diagnosis not present

## 2017-10-09 DIAGNOSIS — D225 Melanocytic nevi of trunk: Secondary | ICD-10-CM | POA: Diagnosis not present

## 2017-10-09 DIAGNOSIS — Z85820 Personal history of malignant melanoma of skin: Secondary | ICD-10-CM | POA: Diagnosis not present

## 2017-10-09 DIAGNOSIS — X32XXXA Exposure to sunlight, initial encounter: Secondary | ICD-10-CM | POA: Diagnosis not present

## 2017-10-09 DIAGNOSIS — Z08 Encounter for follow-up examination after completed treatment for malignant neoplasm: Secondary | ICD-10-CM | POA: Diagnosis not present

## 2017-10-09 DIAGNOSIS — L570 Actinic keratosis: Secondary | ICD-10-CM | POA: Diagnosis not present

## 2017-10-09 DIAGNOSIS — D2271 Melanocytic nevi of right lower limb, including hip: Secondary | ICD-10-CM | POA: Diagnosis not present

## 2017-10-11 DIAGNOSIS — R972 Elevated prostate specific antigen [PSA]: Secondary | ICD-10-CM | POA: Diagnosis not present

## 2017-10-13 ENCOUNTER — Encounter: Payer: Medicare Other | Attending: Internal Medicine | Admitting: *Deleted

## 2017-10-13 ENCOUNTER — Encounter: Payer: Self-pay | Admitting: *Deleted

## 2017-10-13 VITALS — BP 138/70 | Ht 69.5 in | Wt 243.8 lb

## 2017-10-13 DIAGNOSIS — E119 Type 2 diabetes mellitus without complications: Secondary | ICD-10-CM | POA: Diagnosis not present

## 2017-10-13 DIAGNOSIS — Z713 Dietary counseling and surveillance: Secondary | ICD-10-CM | POA: Insufficient documentation

## 2017-10-13 NOTE — Patient Instructions (Addendum)
Check blood sugars 2 x day before breakfast and 2 hrs after one meal 3-4 x week Bring blood sugar records to the next class  Call your doctor for a prescription for:  1. Meter strips (type) Contour Next  checking  3-4 times per week  2. Lancets (type) Contour Microlet checking  3-4   times per week  Exercise: Begin walking  for  15 minutes  3 days a week and gradually increase to 150/minutes a week  Eat 3 meals day,   1  snack a day Space meals 4-6 hours apart Don't skip meals  Make an eye doctor appointment  Return for classes on:

## 2017-10-13 NOTE — Progress Notes (Signed)
Diabetes Self-Management Education  Visit Type: First/Initial  Appt. Start Time: 1105 Appt. End Time: 1220  10/13/2017  [name], identified by name and date of birth, is a 67 y.o. male with a diagnosis of Diabetes: Type 2.   ASSESSMENT  Blood pressure 138/70, height 5' 9.5" (1.765 m), weight 243 lb 12.8 oz (110.6 kg). Body mass index is 35.49 kg/m.  Diabetes Self-Management Education - 10/13/17 1520      Visit Information   Visit Type  First/Initial      Initial Visit   Diabetes Type  Type 2    Are you currently following a meal plan?  No    Are you taking your medications as prescribed?  Yes    Date Diagnosed  "1 week" - but A1C from 2017 was 6.7%      Health Coping   How would you rate your overall health?  Excellent      Psychosocial Assessment   Patient Belief/Attitude about Diabetes  Motivated to manage diabetes    Self-care barriers  None    Self-management support  Doctor's office;Family    Other persons present  Spouse/SO    Patient Concerns  Nutrition/Meal planning;Glycemic Control;Medication;Monitoring;Weight Control    Special Needs  None    Preferred Learning Style  Hands on    Learning Readiness  Change in progress    How often do you need to have someone help you when you read instructions, pamphlets, or other written materials from your doctor or pharmacy?  1 - Never    What is the last grade level you completed in school?  2 years college      Pre-Education Assessment   Patient understands the diabetes disease and treatment process.  Needs Instruction    Patient understands incorporating nutritional management into lifestyle.  Needs Instruction    Patient undertands incorporating physical activity into lifestyle.  Needs Instruction    Patient understands using medications safely.  Needs Instruction    Patient understands monitoring blood glucose, interpreting and using results  Needs Instruction    Patient understands prevention, detection, and  treatment of acute complications.  Needs Instruction    Patient understands prevention, detection, and treatment of chronic complications.  Needs Instruction    Patient understands how to develop strategies to address psychosocial issues.  Needs Instruction    Patient understands how to develop strategies to promote health/change behavior.  Needs Instruction      Complications   Last HgB A1C per patient/outside source  7.7 % 10/04/17    How often do you check your blood sugar?  0 times/day (not testing) Provided Contour Next One meter and instructed on use. BG upon return demonstration was 133 mg/dL at 12:00 pm - fasting.     Have you had a dilated eye exam in the past 12 months?  No    Have you had a dental exam in the past 12 months?  Yes    Are you checking your feet?  No      Dietary Intake   Breakfast  eggs and toast    Lunch  skips    Snack (afternoon)  nuts, pretzels, cheese, chips, crackers    Dinner  beef, pork, chicken, fish, potatoes, pasta, rice, peas, beans, corn, broccoli, salads almost daily    Beverage(s)  water, coffee      Exercise   Exercise Type  ADL's      Patient Education   Previous Diabetes Education  No  Disease state   Definition of diabetes, type 1 and 2, and the diagnosis of diabetes;Factors that contribute to the development of diabetes    Nutrition management   Role of diet in the treatment of diabetes and the relationship between the three main macronutrients and blood glucose level;Reviewed blood glucose goals for pre and post meals and how to evaluate the patients' food intake on their blood glucose level.;Effects of alcohol on blood glucose and safety factors with consumption of alcohol.    Physical activity and exercise   Role of exercise on diabetes management, blood pressure control and cardiac health.    Medications  Reviewed patients medication for diabetes, action, purpose, timing of dose and side effects.    Monitoring  Taught/evaluated SMBG  meter.;Purpose and frequency of SMBG.;Taught/discussed recording of test results and interpretation of SMBG.;Identified appropriate SMBG and/or A1C goals.    Chronic complications  Relationship between chronic complications and blood glucose control    Psychosocial adjustment  Identified and addressed patients feelings and concerns about diabetes      Individualized Goals (developed by patient)   Reducing Risk Improve blood sugars Decrease medications Prevent diabetes complications Lose weight     Outcomes   Expected Outcomes  Demonstrated interest in learning. Expect positive outcomes    Future DMSE  4-6 wks       Individualized Plan for Diabetes Self-Management Training:   Learning Objective:  Patient will have a greater understanding of diabetes self-management. Patient education plan is to attend individual and/or group sessions per assessed needs and concerns.   Plan:   Patient Instructions  Check blood sugars 2 x day before breakfast and 2 hrs after one meal 3-4 x week Bring blood sugar records to the next class Call your doctor for a prescription for:  1. Meter strips (type) Contour Next  checking  3-4 times per week  2. Lancets (type) Contour Microlet checking  3-4   times per week Exercise: Begin walking  for  15 minutes  3 days a week and gradually increase to 150/minutes a week Eat 3 meals day,   1  snack a day Space meals 4-6 hours apart Don't skip meals Make an eye doctor appointment   Expected Outcomes:  Demonstrated interest in learning. Expect positive outcomes  Education material provided:  General Meal Planning Guidelines Simple Meal Plan Meter - Contour Next One  If problems or questions, patient to contact team via: Johny Drilling, [place], [place], CDE [phone]  Future DSME appointment: 4-6 wks  November 23, 2017 for Diabetes Class 1

## 2017-10-31 DIAGNOSIS — R972 Elevated prostate specific antigen [PSA]: Secondary | ICD-10-CM | POA: Diagnosis not present

## 2017-11-02 ENCOUNTER — Encounter (INDEPENDENT_AMBULATORY_CARE_PROVIDER_SITE_OTHER): Payer: Self-pay | Admitting: Orthopaedic Surgery

## 2017-11-02 ENCOUNTER — Ambulatory Visit (INDEPENDENT_AMBULATORY_CARE_PROVIDER_SITE_OTHER): Payer: Medicare Other | Admitting: Orthopaedic Surgery

## 2017-11-02 ENCOUNTER — Ambulatory Visit (INDEPENDENT_AMBULATORY_CARE_PROVIDER_SITE_OTHER): Payer: Medicare Other

## 2017-11-02 DIAGNOSIS — Z96642 Presence of left artificial hip joint: Secondary | ICD-10-CM | POA: Diagnosis not present

## 2017-11-02 NOTE — Progress Notes (Signed)
The patient is one-year status post a left total hip arthroplasty.  He has no complaints at onset is been doing great.  On exam he moves his hip around easily actively and passively with no issues at all.  He is walking without a limp and is happy overall.  His leg lengths are equal.  He has no issues with his incision or his knee.  I can move his right hip around easily without any difficulty.  His right knee does have previous surgical incision and varus malalignment but so far as not bothering him.  This is from an old injury in his younger days.  This point he is doing so well that he can follow-up as needed.  We talked about things we need to bring him back to [place].  Of note x-rays of the pelvis and left hip shows a well-seated implant with no complicating features.

## 2017-11-03 ENCOUNTER — Ambulatory Visit (INDEPENDENT_AMBULATORY_CARE_PROVIDER_SITE_OTHER): Payer: Medicare Other | Admitting: Internal Medicine

## 2017-11-03 ENCOUNTER — Encounter: Payer: Self-pay | Admitting: Internal Medicine

## 2017-11-03 VITALS — BP 122/78 | HR 55 | Temp 97.6°F | Ht 70.0 in | Wt 234.0 lb

## 2017-11-03 DIAGNOSIS — E119 Type 2 diabetes mellitus without complications: Secondary | ICD-10-CM | POA: Diagnosis not present

## 2017-11-03 DIAGNOSIS — I10 Essential (primary) hypertension: Secondary | ICD-10-CM | POA: Diagnosis not present

## 2017-11-03 NOTE — Assessment & Plan Note (Signed)
BP Readings from Last 3 Encounters:  11/03/17 122/78  10/13/17 138/70  10/04/17 (!) 170/80   Markedly better now Will continue the amlodipine Regular follow up

## 2017-11-03 NOTE — Progress Notes (Signed)
Subjective:    Patient ID: Ricardo Reese, male    DOB: Sep 16, 1950, 67 y.o.   MRN: [id number]  HPI Here for follow up of elevated BP  Has "corrected bad habits" and getting back to what he used to do Eating better Working out some  No trouble with the new medication No edema No chest pain No SOB  Checking sugars They have improved Around 110 now  Current Outpatient Medications on File Prior to Visit  Medication Sig Dispense Refill  . acetaminophen (TYLENOL) 650 MG CR tablet Take 1,300 mg by mouth every 8 (eight) hours.    Marland Kitchen amLODipine (NORVASC) 5 MG tablet Take 1 tablet (5 mg total) by mouth daily. 90 tablet 3  . B Complex-C (SUPER B COMPLEX PO) Take 1 tablet by mouth daily.     . fish oil-omega-3 fatty acids 1000 MG capsule Take 1 g by mouth daily.    Marland Kitchen levofloxacin (LEVAQUIN) 750 MG tablet     . losartan-hydrochlorothiazide (HYZAAR) 100-25 MG tablet Take 1 tablet by mouth daily. 90 tablet 3  . metFORMIN (GLUCOPHAGE) 500 MG tablet Take 1 tablet (500 mg total) by mouth 2 (two) times daily with a meal. 180 tablet 3  . pravastatin (PRAVACHOL) 40 MG tablet Take 1 tablet (40 mg total) by mouth daily. 90 tablet 3  . sildenafil (REVATIO) 20 MG tablet Take 3-5 tablets (60-100 mg total) by mouth daily as needed. 50 tablet 11   No current facility-administered medications on file prior to visit.     Allergies  Allergen Reactions  . No Known Allergies     Past Medical History:  Diagnosis Date  . Cancer ([place])    basal cell- face  . Diabetes mellitus without complication ([place])   . History of kidney stones    has had twice  . Hyperlipidemia   . Hypertension   . Impaired fasting glucose   . Kidney stone 1983  . 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 Pneumonia    walking back in 2002    Past Surgical History:  Procedure Laterality Date  . KNEE SURGERY  1969   cartilage removal right knee   . melanoma back  09/2010  . 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 TOTAL  HIP ARTHROPLASTY Left 09/20/2016   Procedure: LEFT TOTAL HIP ARTHROPLASTY ANTERIOR APPROACH;  Surgeon: Mcarthur Rossetti, MD;  Location: [place];  Service: Orthopedics;  Laterality: Left;    Family History  Problem Relation Age of Onset  . Stroke Father   . Heart disease Brother   . Diabetes Neg Hx   . Colon cancer Neg Hx   . Stomach cancer Neg Hx   . Esophageal cancer Neg Hx   . Rectal cancer Neg Hx     Social History   Socioeconomic History  . Marital status: Married    Spouse name: Not on file  . Number of children: 2  . Years of education: Not on file  . Highest education level: Not on file  Occupational History  . 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 Financial resource strain: Not on file  . Food insecurity:    Worry: Not on file    Inability: Not on file  . Transportation needs:    Medical: Not on file    Non-medical: Not on file  Tobacco Use  . Smoking status: Never Smoker  . Smokeless  tobacco: Never Used  Substance and Sexual Activity  . Alcohol use: Yes    Alcohol/week: 6.0 oz    Types: 10 Cans of beer per week    Comment: occasional beer  . Drug use: No  . Sexual activity: Not on file  Lifestyle  . Physical activity:    Days per week: Not on file    Minutes per session: Not on file  . Stress: Not on file  Relationships  . Social connections:    Talks on phone: Not on file    Gets together: Not on file    Attends religious service: Not on file    Active member of club or organization: Not on file    Attends meetings of clubs or organizations: Not on file    Relationship status: Not on file  . Intimate partner violence:    Fear of current or ex partner: Not on file    Emotionally abused: Not on file    Physically abused: Not on file    Forced sexual activity: Not on file  Other Topics Concern  . Not on file  Social History Narrative   Has living will   Wife is health care POA--- daughters next    Would accept resuscitation   No tube feeds if cognitively unaware     Review of Systems Weight down 10# Sleeps per his usual    Objective:   Physical Exam  Constitutional: He appears well-developed. No distress.  Neck: No thyromegaly present.  Cardiovascular: Normal rate, regular rhythm and normal heart sounds. Exam reveals no gallop.  No murmur heard. Respiratory: Effort normal and breath sounds normal. No respiratory distress. He has no wheezes. He has no rales.  Musculoskeletal: He exhibits no edema.  Lymphadenopathy:    He has no cervical adenopathy.  Psychiatric: He has a normal mood and affect. His behavior is normal.           Assessment & Plan:

## 2017-11-03 NOTE — Assessment & Plan Note (Signed)
Sugars better now Continuing the classes On the metformin

## 2017-11-15 DIAGNOSIS — C61 Malignant neoplasm of prostate: Secondary | ICD-10-CM | POA: Diagnosis not present

## 2017-11-15 DIAGNOSIS — R972 Elevated prostate specific antigen [PSA]: Secondary | ICD-10-CM | POA: Diagnosis not present

## 2017-11-20 ENCOUNTER — Other Ambulatory Visit: Payer: Self-pay | Admitting: Urology

## 2017-11-20 DIAGNOSIS — C61 Malignant neoplasm of prostate: Secondary | ICD-10-CM

## 2017-11-23 ENCOUNTER — Encounter: Payer: Self-pay | Admitting: Dietician

## 2017-11-23 ENCOUNTER — Ambulatory Visit: Payer: Medicare Other | Admitting: *Deleted

## 2017-11-23 ENCOUNTER — Encounter: Payer: Medicare Other | Attending: Internal Medicine | Admitting: Dietician

## 2017-11-23 VITALS — Wt 231.8 lb

## 2017-11-23 DIAGNOSIS — Z713 Dietary counseling and surveillance: Secondary | ICD-10-CM | POA: Insufficient documentation

## 2017-11-23 DIAGNOSIS — E119 Type 2 diabetes mellitus without complications: Secondary | ICD-10-CM

## 2017-11-23 NOTE — Progress Notes (Signed)

## 2017-11-29 ENCOUNTER — Encounter (HOSPITAL_COMMUNITY)
Admission: RE | Admit: 2017-11-29 | Discharge: 2017-11-29 | Disposition: A | Discharge status: Home / Self Care | Payer: Medicare Other | Source: Ambulatory Visit | Attending: Urology | Admitting: Urology

## 2017-11-29 DIAGNOSIS — C61 Malignant neoplasm of prostate: Secondary | ICD-10-CM | POA: Insufficient documentation

## 2017-11-29 MED ORDER — TECHNETIUM TC 99M MEDRONATE IV KIT
20.2000 | PACK | Freq: Once | INTRAVENOUS | Status: AC
  Administered 2017-11-29: 20.2 via INTRAVENOUS

## 2017-11-30 ENCOUNTER — Encounter: Payer: Self-pay | Admitting: *Deleted

## 2017-11-30 ENCOUNTER — Encounter: Payer: Medicare Other | Admitting: *Deleted

## 2017-11-30 ENCOUNTER — Ambulatory Visit: Payer: Medicare Other | Admitting: *Deleted

## 2017-11-30 VITALS — Wt 229.1 lb

## 2017-11-30 DIAGNOSIS — E119 Type 2 diabetes mellitus without complications: Secondary | ICD-10-CM | POA: Diagnosis not present

## 2017-11-30 DIAGNOSIS — Z713 Dietary counseling and surveillance: Secondary | ICD-10-CM | POA: Diagnosis not present

## 2017-11-30 NOTE — Progress Notes (Signed)
Appt. Start Time: 0900 Appt. End Time: 1200  Pt has been off Metformin due to bone and CT scans for new diagnosis of prostate cancer. He will resume Metformin on 12/02/17.   Class 2 Nutritional Management - identify sources of carbohydrate, protein and fat; plan balanced meals; estimate servings of carbohydrates in meals  Psychosocial - identify DM as a source of stress; state the effects of stress on BG control  Exercise - describe the effects of exercise on blood glucose and importance of regular exercise in controlling diabetes; state a plan for personal exercise; verbalize contraindications for exercise  Self-Monitoring - state importance of SMBG; use SMBG results to effectively manage diabetes; identify importance of regular HbA1C testing and goals for results  Acute Complications - recognize hyperglycemia and hypoglycemia with causes and effects; identify blood glucose results as high, low or in control; list steps in treating and preventing high and low blood glucose  Sick Day Guidelines: state appropriate measure to manage blood glucose when ill (need for meds, HBGM plan, when to call physician, need for fluids)  Chronic Complications/Foot, Skin, Eye Dental Care - identify possible long-term complications of diabetes (retinopathy, neuropathy, nephropathy, cardiovascular disease, infections); explain steps in prevention and treatment of chronic complications; state importance of daily self-foot exams; describe how to examine feet and what to look for; explain appropriate eye and dental care  Lifestyle Changes/Goals - state benefits of making appropriate lifestyle changes; identify habits that need to change (meals, tobacco, alcohol); identify strategies to reduce risk factors for personal health  Pregnancy/Sexual Health - state importance of good blood glucose control in preventing sexual problems (impotence, vaginal dryness, infections, loss of desire)  Teaching Materials Used: Class 2  Slide Packet A1C Pamphlet Foot Care Literature Kidney Test Handout Stroke Card Quick and "Balanced" Meal Ideas Carb Counting and Meal Planning Book Goals for Class 2

## 2017-12-06 DIAGNOSIS — C61 Malignant neoplasm of prostate: Secondary | ICD-10-CM | POA: Diagnosis not present

## 2017-12-07 ENCOUNTER — Encounter: Payer: Self-pay | Admitting: Dietician

## 2017-12-07 ENCOUNTER — Ambulatory Visit: Payer: Medicare Other | Admitting: *Deleted

## 2017-12-07 ENCOUNTER — Encounter: Payer: Medicare Other | Admitting: Dietician

## 2017-12-07 VITALS — BP 138/70 | Wt 228.1 lb

## 2017-12-07 DIAGNOSIS — E119 Type 2 diabetes mellitus without complications: Secondary | ICD-10-CM

## 2017-12-07 DIAGNOSIS — Z713 Dietary counseling and surveillance: Secondary | ICD-10-CM | POA: Diagnosis not present

## 2017-12-07 NOTE — Progress Notes (Signed)
Appt. Start Time: 9:00am Appt. End Time: 12:00  Class 3 Diabetes Overview - identify functions of pancreas and insulin; define insulin deficiency vs insulin resistance  Medications - state name, dose, timing of currently prescribed medications; describe types of medications available for diabetes  Psychosocial - identify DM as a source of stress; state the effects of stress on BG control; verbalize appropriate stress management techniques; identify personal stress issues   Nutritional Management - use food labels to identify serving size, content of carbohydrate, fiber, protein, fat, saturated fat and sodium; recognize food sources of fat, saturated fat, trans fat, and sodium, and verbalize goals for intake; describe healthful, appropriate food choices when dining out   Exercise - state a plan for personal exercise; verbalize contraindications for exercise  Self-Monitoring - state importance of SMBG; use SMBG results to effectively manage diabetes; identify importance of regular HbA1C testing and goals for results  Acute Complications - recognize hyperglycemia and hypoglycemia with causes and effects; identify blood glucose results as high, low or in control; list steps in treating and preventing high and low blood glucose  Chronic Complications - state importance of daily self-foot exams; explain appropriate eye and dental care  Lifestyle Changes/Goals & Health/Community Resources - set goals for proper diabetes care; state need for and frequency of healthcare follow-up; describe appropriate community resources for good health (ADA, web sites, apps)   Teaching Materials Used: Class 3 Slide Packet Diabetes Stress Test Stress Management Tools Stress Poem Goal Setting Worksheet Website/App List    

## 2017-12-12 ENCOUNTER — Telehealth: Payer: Self-pay | Admitting: Medical Oncology

## 2017-12-12 ENCOUNTER — Ambulatory Visit: Payer: Medicare Other | Admitting: Radiation Oncology

## 2017-12-12 NOTE — Telephone Encounter (Signed)
Left message requesting a return call to discuss referral to the [facility] 12/22/17.

## 2017-12-13 ENCOUNTER — Encounter: Payer: Self-pay | Admitting: *Deleted

## 2017-12-18 ENCOUNTER — Encounter: Payer: Self-pay | Admitting: Medical Oncology

## 2017-12-19 NOTE — Progress Notes (Signed)
I called Ricardo Reese to introduce myself as the Prostate Nurse Navigator and the Coordinator of the Prostate [place].  1. I confirmed with the patient he is aware of his referral to the clinic 7/23 arriving at 12:30 pm/   2. I discussed the format of the clinic and the physicians he will be seeing that day.  3. I discussed where the clinic is located and how to contact me.  4. I confirmed his address and informed him I would be mailing a packet of information and forms to be completed. I asked him to bring them with him the day of his appointment.   He voiced understanding of the above. I asked him to call me if he has any questions or concerns regarding his appointments or the forms he needs to complete.

## 2017-12-20 ENCOUNTER — Telehealth: Payer: Self-pay

## 2017-12-20 NOTE — Telephone Encounter (Signed)
Copied from [place] [id number]. Topic: General - Other >> Dec 20, 2017  4:18 PM Keene Breath wrote: Reason for CRM: Patient called to request contour next test strips for his diabetic meter.  Please advise.  CB#[phone].

## 2017-12-21 MED ORDER — GLUCOSE BLOOD VI STRP: ORAL_STRIP | 3 refills | Status: DC

## 2017-12-21 NOTE — Telephone Encounter (Signed)
Rx sent to pharmacy   

## 2017-12-22 ENCOUNTER — Ambulatory Visit: Payer: Medicare Other | Admitting: Radiation Oncology

## 2017-12-22 ENCOUNTER — Telehealth: Payer: Self-pay | Admitting: Medical Oncology

## 2017-12-22 NOTE — Telephone Encounter (Signed)
Patient called asking if we take [employer]. I informed him yes.

## 2018-01-01 ENCOUNTER — Telehealth: Payer: Self-pay | Admitting: Medical Oncology

## 2018-01-01 ENCOUNTER — Encounter: Payer: Self-pay | Admitting: Radiation Oncology

## 2018-01-01 NOTE — Telephone Encounter (Signed)
Spoke with Ricardo Reese to confirm appointment for [facility] 01/02/18 arriving at 12:30 pm. I reviewed valet parking, registration and asked him to have lunch before arrival due to length of clinic. I reminded him to bring his completed medical forms. He voiced understanding and look forward to meeting him tomorrow.

## 2018-01-01 NOTE — Progress Notes (Signed)
GU Location of Tumor / Histology: prostatic adenocarcinoma  If Prostate Cancer, Gleason Score is (4 + 3) and PSA is (21.9). Prostate volume: 66-77 grams on imaging.  PSA history: 09/2017  PSA  21.9 10/31/2017 PSA  20.30 03/2014 PSA  3.45 08/2011  PSA  2.17  Biopsies of prostate (if applicable) revealed:    Past/Anticipated interventions by urology, if any: prostate biopsy  Past/Anticipated interventions by medical oncology, if any: no  Weight changes, if any: no  Bowel/Bladder complaints, if any: See IPSS    Nausea/Vomiting, if any: no  Pain issues, if any:  no  SAFETY ISSUES:  Prior radiation? no  Pacemaker/ICD? no  Possible current pregnancy? no  Is the patient on methotrexate? no  Current Complaints / other details:  67 year old male. Married with two daughters.   Patient isn't sure what he wants to do. He has considered brachytherapy or surgery. Dr. Junious Silk discussed salvage and ADT with patient due to his higher PSA and Gleason score.

## 2018-01-02 ENCOUNTER — Encounter: Payer: Self-pay | Admitting: Radiation Oncology

## 2018-01-02 ENCOUNTER — Telehealth: Payer: Self-pay | Admitting: Oncology

## 2018-01-02 ENCOUNTER — Ambulatory Visit
Admission: RE | Admit: 2018-01-02 | Discharge: 2018-01-02 | Disposition: A | Discharge status: Home / Self Care | Payer: Medicare Other | Source: Ambulatory Visit | Attending: Radiation Oncology | Admitting: Radiation Oncology

## 2018-01-02 ENCOUNTER — Encounter: Payer: Self-pay | Admitting: General Practice

## 2018-01-02 ENCOUNTER — Inpatient Hospital Stay: Payer: Medicare Other | Attending: Oncology | Admitting: Oncology

## 2018-01-02 ENCOUNTER — Encounter: Payer: Self-pay | Admitting: Medical Oncology

## 2018-01-02 DIAGNOSIS — R972 Elevated prostate specific antigen [PSA]: Secondary | ICD-10-CM | POA: Diagnosis not present

## 2018-01-02 DIAGNOSIS — Z85820 Personal history of malignant melanoma of skin: Secondary | ICD-10-CM | POA: Insufficient documentation

## 2018-01-02 DIAGNOSIS — I10 Essential (primary) hypertension: Secondary | ICD-10-CM

## 2018-01-02 DIAGNOSIS — Z8042 Family history of malignant neoplasm of prostate: Secondary | ICD-10-CM | POA: Diagnosis not present

## 2018-01-02 DIAGNOSIS — C61 Malignant neoplasm of prostate: Secondary | ICD-10-CM | POA: Insufficient documentation

## 2018-01-02 DIAGNOSIS — E119 Type 2 diabetes mellitus without complications: Secondary | ICD-10-CM | POA: Diagnosis not present

## 2018-01-02 DIAGNOSIS — E785 Hyperlipidemia, unspecified: Secondary | ICD-10-CM | POA: Diagnosis not present

## 2018-01-02 DIAGNOSIS — Z8546 Personal history of malignant neoplasm of prostate: Secondary | ICD-10-CM | POA: Insufficient documentation

## 2018-01-02 DIAGNOSIS — Z79899 Other long term (current) drug therapy: Secondary | ICD-10-CM | POA: Diagnosis not present

## 2018-01-02 DIAGNOSIS — Z801 Family history of malignant neoplasm of trachea, bronchus and lung: Secondary | ICD-10-CM | POA: Diagnosis not present

## 2018-01-02 HISTORY — DX: Malignant neoplasm of prostate: C61

## 2018-01-02 HISTORY — DX: Unspecified malignant neoplasm of skin, unspecified: C44.90

## 2018-01-02 NOTE — Progress Notes (Signed)
                               Care Plan Summary  Name: [name] DOB: [birth year]    Your Medical Team:   Urologist -  Dr. Raynelle Bring, [facility]  Radiation Oncologist - Dr. Tyler Pita, [facility]   Medical Oncologist - Dr. Zola Button, [place]  Recommendations: 1) Robotic prostatectomy 2) Radiation- 8 week of external radiotherapy or 5 1/2 weeks radiation with seed boost.   * These recommendations are based on information available as of today's consult.      Recommendations may change depending on the results of further tests or exams.  Next Steps: 1) Consider your options and call Cira Rue, RN with decision    When appointments need to be scheduled, you will be contacted by [facility] and/or [facility].  Questions?  Please do not hesitate to call Cira Rue, RN, BSN, OCN at [phone] any questions or concerns.  Shirlean Mylar is your Oncology Nurse Navigator and is available to assist you while you're receiving your medical care at [facility].

## 2018-01-02 NOTE — Progress Notes (Signed)
Radiation Oncology         [phone] ________________________________  Multidisciplinary Prostate Cancer Clinic  Initial Radiation Oncology Consultation  Name: Ricardo Reese MRN: [id number]  Date: 01/02/2018  DOB: 1951/03/05  IR:JJOACZ, Theophilus Kinds, MD  Raynelle Bring, MD   REFERRING PHYSICIAN: Raynelle Bring, MD  DIAGNOSIS: 67 y.o. gentleman with stage T1c adenocarcinoma of the prostate with a Gleason's score of 4+3 and a PSA of 20.3    ICD-10-CM   1. Malignant neoplasm of prostate ([place]) C61     HISTORY OF PRESENT ILLNESS::Ricardo Reese is a 67 y.o. gentleman.  He was noted to have an elevated PSA of 21.9 on 10/05/17 by his primary care physician, Dr. Venia Carbon, MD.  Previous PSAs had been 2.17 in 08/2011 and 3.45 in 03/2014. Accordingly, he was referred for evaluation in urology by Dr. Junious Silk on 10/11/17,  digital rectal examination was performed at that time revealing symmetric prostate lobes without discrete nodules.  A repeat PSA was performed on 10/31/2017 and remained elevated at 20.3.  Therefore, the patient proceeded to transrectal ultrasound with 12 biopsies of the prostate on 11/15/17.  The prostate volume measured 65.79 cc.  Out of 12 core biopsies, 5 were positive.  The maximum Gleason score was 4+3, and this was seen in left base lateral, left mid lateral, left base, and left mid with 3+3 disease in the left apex.   The patient reviewed the biopsy results with his urologist and he has kindly been referred today to the multidisciplinary prostate cancer clinic for presentation of pathology and radiology studies in our conference for discussion of potential radiation treatment options and clinical evaluation.    PSA 10/31/2017  20.30 09/2017   21.9 03/2014  3.45 08/2011   2.17      PREVIOUS RADIATION THERAPY: No  PAST MEDICAL HISTORY:  has a past medical history of Diabetes mellitus without complication ([place]), History of kidney stones, Hyperlipidemia, Hypertension,  Impaired fasting glucose, Kidney stone (1983), Melanoma of back ([place]) (2013), Pneumonia, Prostate cancer ([place]), and Skin cancer (06/14/2011).    PAST SURGICAL HISTORY: Past Surgical History:  Procedure Laterality Date  . KNEE SURGERY  1969   cartilage removal right knee   . melanoma back  09/2010  . PROSTATE BIOPSY    . 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   Procedure: LEFT TOTAL HIP ARTHROPLASTY ANTERIOR APPROACH;  Surgeon: Mcarthur Rossetti, MD;  Location: [place];  Service: Orthopedics;  Laterality: Left;    FAMILY HISTORY: family history includes Heart disease in his brother; Lung cancer in his father; Prostate cancer in his brother; Stroke in his father.  SOCIAL HISTORY:  reports that he has never smoked. He has never used smokeless tobacco. He reports that he drinks about 1.8 - 2.4 oz of alcohol per week. He reports that he does not use drugs.  ALLERGIES: No known allergies  MEDICATIONS:  Current Outpatient Medications  Medication Sig Dispense Refill  . amLODipine (NORVASC) 5 MG tablet Take 1 tablet (5 mg total) by mouth daily. 90 tablet 3  . B Complex-C (SUPER B COMPLEX PO) Take 1 tablet by mouth daily.     . fish oil-omega-3 fatty acids 1000 MG capsule Take 1 g by mouth daily.    Marland Kitchen glucose blood (CONTOUR NEXT TEST) test strip Use to obtain blood sugar once a day. Dx Code E11.9 100 each 3  . losartan-hydrochlorothiazide (HYZAAR) 100-25 MG tablet Take 1 tablet by  mouth daily. 90 tablet 3  . metFORMIN (GLUCOPHAGE) 500 MG tablet Take 1 tablet (500 mg total) by mouth 2 (two) times daily with a meal. 180 tablet 3  . pravastatin (PRAVACHOL) 40 MG tablet Take 1 tablet (40 mg total) by mouth daily. 90 tablet 3  . sildenafil (REVATIO) 20 MG tablet Take 3-5 tablets (60-100 mg total) by mouth daily as needed. 50 tablet 11   No current facility-administered medications for this encounter.     REVIEW OF SYSTEMS:  On review of systems, the  patient reports that he is doing well overall. He denies any chest pain, shortness of breath, cough, fevers, chills, night sweats, unintended weight changes. He denies any bowel disturbances, and denies abdominal pain, nausea or vomiting. He denies any new musculoskeletal or joint aches or pains. His IPSS was 3, indicating mild urinary symptoms with frequency, urgency and nocturia x2. He is able to complete sexual activity with all attempts. A complete review of systems is obtained and is otherwise negative.   PHYSICAL EXAM:  Wt Readings from Last 3 Encounters:  01/02/18 222 lb 12.8 oz (101.1 kg)  12/07/17 228 lb 1.6 oz (103.5 kg)  11/30/17 229 lb 1.6 oz (103.9 kg)   Temp Readings from Last 3 Encounters:  01/02/18 97.7 F (36.5 C) (Oral)  11/03/17 97.6 F (36.4 C) (Oral)  10/04/17 97.9 F (36.6 C) (Oral)   BP Readings from Last 3 Encounters:  01/02/18 (!) 159/64  12/07/17 138/70  11/03/17 122/78   Pulse Readings from Last 3 Encounters:  01/02/18 (!) 58  11/03/17 (!) 55  10/04/17 75   Pain Assessment Pain Score: 0-No pain/10  In general this is a well appearing Caucasian male in no acute distress. He is alert and oriented x4 and appropriate throughout the examination. HEENT reveals that the patient is normocephalic, atraumatic. EOMs are intact. PERRLA. Skin is intact without any evidence of gross lesions. Cardiovascular exam reveals a regular rate and rhythm, no clicks rubs or murmurs are auscultated. Chest is clear to auscultation bilaterally. Lymphatic assessment is performed and does not reveal any adenopathy in the cervical, supraclavicular, axillary, or inguinal chains. Abdomen has active bowel sounds in all quadrants and is intact. The abdomen is soft, non tender, non distended. Lower extremities are negative for pretibial pitting edema, deep calf tenderness, cyanosis or clubbing.  KPS = 100  100 - Normal; no complaints; no evidence of disease. 90   - Able to carry on normal  activity; minor signs or symptoms of disease. 80   - Normal activity with effort; some signs or symptoms of disease. 84   - Cares for self; unable to carry on normal activity or to do active work. 60   - Requires occasional assistance, but is able to care for most of his personal needs. 50   - Requires considerable assistance and frequent medical care. 31   - Disabled; requires special care and assistance. 82   - Severely disabled; hospital admission is indicated although death not imminent. 82   - Very sick; hospital admission necessary; active supportive treatment necessary. 10   - Moribund; fatal processes progressing rapidly. 0     - Dead  Karnofsky DA, Abelmann [place], Craver LS and Burchenal [facility] [phone]) The use of the nitrogen mustards in the palliative treatment of carcinoma: with particular reference to bronchogenic carcinoma Cancer 1 634-56   LABORATORY DATA:  Lab Results  Component Value Date   WBC 6.4 10/04/2017   HGB 14.9 10/04/2017  HCT 43.4 10/04/2017   MCV 86.6 10/04/2017   PLT 264.0 10/04/2017   Lab Results  Component Value Date   NA 137 10/04/2017   K 4.8 10/04/2017   CL 102 10/04/2017   CO2 28 10/04/2017   Lab Results  Component Value Date   ALT 22 10/04/2017   AST 20 10/04/2017   ALKPHOS 65 10/04/2017   BILITOT 0.5 10/04/2017     RADIOGRAPHY: No results found.    IMPRESSION/PLAN: 67 y.o. gentleman with a stage T1c adenocarcinoma of the prostate with a PSA of 20.3 and a Gleason score of 4+3.  We discussed the patient's workup and outlines the nature of prostate cancer in this setting. The patient's T stage, Gleason's score, and PSA put him into the unfavorable intermediate risk group. Accordingly, he is eligible for a variety of potential treatment options including prostatectomy or ST-ADT in combination with either 8 weeks of external radiation or 5 weeks of external radiation followed by a brachytherapy boost. We discussed the available radiation techniques, and  focused on the details and logistics and delivery. The patient may not be an ideal candidate for brachytherapy boost with a prostate volume of 65.79 prior to downsizing from hormone therapy. We discussed that based on his prostate volume, he would require beginning treatment with a 5 alpha reductase inhibitor and ADT for at least 3 months to allow for downsizing of the prostate prior to initiating radiotherapy. If he were to choose brachytherapy, we would need to repeat prostate imaging after 3 months of ADT/5-ARI treatment to reassess the prostate volume at that time and confirm whether he is a candidate for brachytherapy. We discussed and outlined the risks, benefits, short and long-term effects associated with radiotherapy and compared and contrasted these with prostatectomy. We discussed the role of SpaceOAR in reducing the rectal toxicity associated with radiotherapy. We also detailed the role of ADT in the treatment of unfavorable intermediate risk prostate cancer and outlined the associated side effects that could be expected with this therapy.  At the end of the conversation the patient remains undecided regarding his treatment preference but appears to be most interested in ST- ADT in combination with 8 weeks of external radiotherapy.  He has not started ADT and has not had placement of gold fiducial markers.  We will plan to follow-up with him in the next 1 to 2 weeks to answer any additional questions and ascertain his readiness to commit to treatment.  Should he elect to proceed with external beam radiotherapy, we will contact [facility] to make arrangements for starting ADT as well as fiducial marker placement prior to simulation.  We would anticipate beginning radiotherapy treatments approximately 8 weeks after the initiation of ADT.  We will share our discussion with Dr. Junious Silk and keep him informed of the patient's final treatment decision.   We enjoyed meeting the patient and his wife  today and would be happy to continue to participate further in his care should he elect to proceed with radiotherapy as his treatment of choice.     Nicholos Johns, PA-C    Tyler Pita, MD  [facility] Direct Dial: [phone]  Fax: [phone] [email or web address]  Skype  [email or web address]  This document serves as a record of services personally performed by Tyler Pita, MD and Ashlyn Bruning PA-C. It was created on their behalf by Delton Coombes, a trained medical scribe. The creation of this record is based on the scribe's personal observations and the provider's statements  to them.

## 2018-01-02 NOTE — Telephone Encounter (Signed)
Per 7/23 no los

## 2018-01-02 NOTE — Progress Notes (Signed)
Reason for the request:    Prostate cancer  HPI: I was asked by Dr. Junious Silk to evaluate Ricardo Reese for new diagnosis of prostate cancer.  He is a 67 year old man with history of diabetes, hypertension and melanoma.  He was found to have evaded PSA in April 2019 of 21.9.  This was repeated and was similar.  He underwent a prostate biopsy on 11/15/2017 by Dr. Junious Silk.  The biopsy showed prostate cancer Gleason score 4+3 equal 7 with 5 out of 12 cores involved.  His pelvic CT as well as bone scan showed no evidence of metastatic disease.  He reports no complaints related to his prostate cancer.  He denies any dysuria, hematuria or frequency.  He remains active and continues to attend activities of daily living.  He does not report any headaches, blurry vision, syncope or seizures. Does not report any fevers, chills or sweats.  Does not report any cough, wheezing or hemoptysis.  Does not report any chest pain, palpitation, orthopnea or leg edema.  Does not report any nausea, vomiting or abdominal pain.  Does not report any constipation or diarrhea.  Does not report any skeletal complaints.    Does not report frequency, urgency or hematuria.  Does not report any skin rashes or lesions. Does not report any heat or cold intolerance.  Does not report any lymphadenopathy or petechiae.  Does not report any anxiety or depression.  Remaining review of systems is negative.    Past Medical History:  Diagnosis Date  . Diabetes mellitus without complication ([place])   . History of kidney stones    has had twice  . Hyperlipidemia   . Hypertension   . Impaired fasting glucose   . Kidney stone 1983  . Melanoma of back [facility]) 2013   Dr Evorn Gong  . Pneumonia    walking back in 2002  . Prostate cancer ([place])   . 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 KNEE SURGERY  1969   cartilage removal right knee   . melanoma back  09/2010  . PROSTATE  BIOPSY    . 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   Procedure: LEFT TOTAL HIP ARTHROPLASTY ANTERIOR APPROACH;  Surgeon: Mcarthur Rossetti, MD;  Location: [place];  Service: Orthopedics;  Laterality: Left;  :   Current Outpatient Medications:  .  amLODipine (NORVASC) 5 MG tablet, Take 1 tablet (5 mg total) by mouth daily., Disp: 90 tablet, Rfl: 3 .  B Complex-C (SUPER B COMPLEX PO), Take 1 tablet by mouth daily. , Disp: , Rfl:  .  fish oil-omega-3 fatty acids 1000 MG capsule, Take 1 g by mouth daily., Disp: , Rfl:  .  glucose blood (CONTOUR NEXT TEST) test strip, Use to obtain blood sugar once a day. Dx Code E11.9, Disp: 100 each, Rfl: 3 .  losartan-hydrochlorothiazide (HYZAAR) 100-25 MG tablet, Take 1 tablet by mouth daily., Disp: 90 tablet, Rfl: 3 .  metFORMIN (GLUCOPHAGE) 500 MG tablet, Take 1 tablet (500 mg total) by mouth 2 (two) times daily with a meal., Disp: 180 tablet, Rfl: 3 .  pravastatin (PRAVACHOL) 40 MG tablet, Take 1 tablet (40 mg total) by mouth daily., Disp: 90 tablet, Rfl: 3 .  sildenafil (REVATIO) 20 MG tablet, Take 3-5 tablets (60-100 mg total) by mouth daily as needed., Disp: 50 tablet, Rfl: 11:  Allergies  Allergen Reactions  .  No Known Allergies   :  Family History  Problem Relation Age of Onset  . Stroke Father   . Lung cancer Father   . Prostate cancer Brother   . Heart disease Brother   . Diabetes Neg Hx   . Colon cancer Neg Hx   . Stomach cancer Neg Hx   . Esophageal cancer Neg Hx   . Rectal cancer Neg Hx   :  Social History   Socioeconomic History  . Marital status: Married    Spouse name: [name]  . Number of children: 2  . Years of education: Not on file  . Highest education level: Not on file  Occupational History  . 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 Financial resource strain: Not on file  . Food insecurity:    Worry: Not on file    Inability: Not  on file  . Transportation needs:    Medical: Not on file    Non-medical: Not on file  Tobacco Use  . Smoking status: Never Smoker  . Smokeless tobacco: Never Used  Substance and Sexual Activity  . Alcohol use: Yes    Alcohol/week: 1.8 - 2.4 oz    Types: 3 - 4 Cans of beer per week    Comment: occasional beer  . Drug use: No  . Sexual activity: Yes  Lifestyle  . Physical activity:    Days per week: Not on file    Minutes per session: Not on file  . Stress: Not on file  Relationships  . Social connections:    Talks on phone: Not on file    Gets together: Not on file    Attends religious service: Not on file    Active member of club or organization: Not on file    Attends meetings of clubs or organizations: Not on file    Relationship status: Not on file  . Intimate partner violence:    Fear of current or ex partner: Not on file    Emotionally abused: Not on file    Physically abused: Not on file    Forced sexual activity: Not on file  Other Topics Concern  . Not on file  Social History Narrative   Has living will   Wife is health care POA--- daughters next   Would accept resuscitation   No tube feeds if cognitively unaware  :  Pertinent items are noted in HPI.  Exam: ECOG 0 General appearance: alert and cooperative appeared without distress. Head: atraumatic without any abnormalities. Eyes: conjunctivae/corneas clear. PERRL.  Sclera anicteric. Throat: lips, mucosa, and tongue normal; without oral thrush or ulcers. Resp: clear to auscultation bilaterally without rhonchi, wheezes or dullness to percussion. Cardio: regular rate and rhythm, S1, S2 normal, no murmur, click, rub or gallop GI: soft, non-tender; bowel sounds normal; no masses,  no organomegaly Skin: Skin color, texture, turgor normal. No rashes or lesions Lymph nodes: Cervical, supraclavicular, and axillary nodes normal. Neurologic: Grossly normal without any motor, sensory or deep tendon  reflexes. Musculoskeletal: No joint deformity or effusion.  CBC    Component Value Date/Time   WBC 6.4 10/04/2017 0948   RBC 5.02 10/04/2017 0948   HGB 14.9 10/04/2017 0948   HCT 43.4 10/04/2017 0948   PLT 264.0 10/04/2017 0948   MCV 86.6 10/04/2017 0948   MCH 29.6 09/21/2016 0514   MCHC 34.3 10/04/2017 0948   RDW 14.4 10/04/2017 0948   LYMPHSABS 2.1  08/06/2015 1642   MONOABS 0.8 08/06/2015 1642   EOSABS 0.1 08/06/2015 1642   BASOSABS 0.0 08/06/2015 1642     Chemistry      Component Value Date/Time   NA 137 10/04/2017 0948   K 4.8 10/04/2017 0948   CL 102 10/04/2017 0948   CO2 28 10/04/2017 0948   BUN 18 10/04/2017 0948   CREATININE 0.86 10/04/2017 0948      Component Value Date/Time   CALCIUM 9.7 10/04/2017 0948   ALKPHOS 65 10/04/2017 0948   AST 20 10/04/2017 0948   ALT 22 10/04/2017 0948   BILITOT 0.5 10/04/2017 0948       Assessment and Plan:   67 year old gentleman with prostate cancer diagnosed in June 2019 with Gleason score of 4+3 equal 7 and a PSA of 21.9.  Staging work-up did not reveal any evidence of metastatic disease.  His case was discussed today in the prostate cancer multidisciplinary clinic including review of his pathology with the reviewing pathologist as well as radiology discussion with the reviewing radiologist.  The natural course of this disease was reviewed today with the patient.  Treatment options were also discussed.  These options would include primary surgical therapy versus radiation therapy and androgen deprivation.  Risks and benefits associated with androgen deprivation as well as long-term complications were discussed.  We also discussed the ramification of advanced prostate cancer that if left untreated given his high risk disease, he is at risk of developing.  Treatment options at that point would be palliative and not curative.  He will consider these options and make a decision in the near future.  30  minutes was spent with the  patient face-to-face today.  More than 50% of time was dedicated to patient counseling, education and coordination of the patient's multifaceted care.     Thank you for the referral.  A copy of this consult has been forwarded to the requesting physician.

## 2018-01-02 NOTE — Consult Note (Signed)
[facility]     01/02/2018   --------------------------------------------------------------------------------   Ricardo Reese  MRN: [id number]  PRIMARY CARE:  Venia Carbon, MD  DOB: 11/13/1950, 67 year old Male  REFERRING:  Georgette Dover, MD  SSN: [id number]  PROVIDER:  Festus Aloe, M.D.    TREATING:  Raynelle Bring, M.D.    LOCATION:  [facility] [phone]   --------------------------------------------------------------------------------   CC/HPI: CC: Prostate Cancer   Physician requesting consult: Dr. Eda Keys  PCP: Dr. Viviana Simpler  Location of consult: [facility] - Prostate Cancer Multidisciplinary [facility]   [name] is a 67 year old gentleman who was found to have an elevated PSA of 20.3 prompting a TRUS biopsy of the prostate by Dr. Junious Silk on 11/15/17. This demonstrated Gleason 4+3=7 adenocarcinoma with 5 out of 12 biopsy cores positive for malignancy.   Family history: None.   Imaging studies:  Bone scan (11/29/17): Scattered uptake consistent with degenerative changes. No obvious metastatic disease.  CT pelvis (11/29/17): Negative for metastatic disease.   PMH: He has a history of diabetes, hypertension, and hyperlipidemia.  PSH: No abdominal surgeries.   TNM stage: cT1c N0 M0  PSA: 20.3  Gleason score: 4+3=7  Biopsy (11/15/17): 5/12 cores positive  Left: L apex (2%, 3+3=6), L lateral mid (50%, 4+3=7), L mid (30%, 4+3=7), L lateral base (90%, 4+3=7, PNI), L base (10%, 4+3=7)  Right: Benign  Prostate volume: 65.8 cc   Nomogram  OC disease: 22%  EPE: 75%  SVI: 14%  LNI: 14%  PFS (5 year, 10 year): 40%, 26%   Urinary function: IPSS is 3.  Erectile function: SHIM score is 25. He does require sildenafil 100 mg but does find this to be quite reliable.     ALLERGIES: No Allergies    MEDICATIONS: Metformin Hcl 500 mg tablet  Amlodipine Besylate 5 mg tablet  Losartan-Hydrochlorothiazide 100 mg-25 mg  tablet  Pravastatin Sodium 40 mg tablet  Sidenafil     GU PSH: Locm 300-399Mg /Ml Iodine,1Ml - 11/29/2017 Prostate Needle Biopsy - 11/15/2017    NON-GU PSH: Hip Replacement Surgical Pathology, Gross And Microscopic Examination For Prostate Needle - 11/15/2017    GU PMH: Prostate Cancer - 12/06/2017 Elevated PSA - 11/15/2017, - 10/11/2017    NON-GU PMH: Diabetes Type 2 Hypercholesterolemia Hypertension Personal history of malignant melanoma of skin    FAMILY HISTORY: Heart problem - Runs in Family Lung Cancer - Father   SOCIAL HISTORY: Marital Status: Married Preferred Language: English; Race: White Current Smoking Status: Patient has never smoked.   Tobacco Use Assessment Completed: Used Tobacco in last 30 days? Drinks 2 drinks per day.  Drinks 3 caffeinated drinks per day.     Notes: 2 daughters    REVIEW OF SYSTEMS:    GU Review Male:   Patient denies frequent urination, hard to postpone urination, burning/ pain with urination, get up at night to urinate, leakage of urine, stream starts and stops, trouble starting your streams, and have to strain to urinate .  Gastrointestinal (Lower):   Patient denies diarrhea and constipation.  Gastrointestinal (Upper):   Patient denies nausea and vomiting.  Constitutional:   Patient denies fever, night sweats, weight loss, and fatigue.  Skin:   Patient denies skin rash/ lesion and itching.  Eyes:   Patient denies blurred vision and double vision.  Ears/ Nose/ Throat:   Patient denies sore throat and sinus problems.  Hematologic/Lymphatic:   Patient denies swollen glands and easy  bruising.  Cardiovascular:   Patient denies leg swelling and chest pains.  Respiratory:   Patient denies cough and shortness of breath.  Endocrine:   Patient denies excessive thirst.  Musculoskeletal:   Patient denies back pain and joint pain.  Neurological:   Patient denies headaches and dizziness.  Psychologic:   Patient denies depression and anxiety.   VITAL  SIGNS: None   MULTI-SYSTEM PHYSICAL EXAMINATION:    Constitutional: Well-nourished. No physical deformities. Normally developed. Good grooming.     PAST DATA REVIEWED:  Source Of History:  Patient  Lab Test Review:   PSA  Records Review:   Pathology Reports, Previous Patient Records  X-Ray Review: C.T. Abdomen/Pelvis: Reviewed Films.  Bone Scan: Reviewed Films.     10/31/17  PSA  Total PSA 20.30 ng/mL    PROCEDURES: None   ASSESSMENT:      ICD-10 Details  1 GU:   Prostate Cancer - C61    PLAN:           Document Letter(s):  Created for Patient: Clinical Summary         Notes:   1. Prostate cancer: I had a long conversation with Mr. Bunt and his wife today regarding his prostate cancer diagnosis and options for management/treatment. I did recommend therapy of curative intent considering his disease parameters and life expectancy. The patient was counseled about the natural history of prostate cancer and the standard treatment options that are available for prostate cancer. It was explained to him how his age and life expectancy, clinical stage, Gleason score, and PSA affect his prognosis, the decision to proceed with additional staging studies, as well as how that information influences recommended treatment strategies. We discussed the roles for active surveillance, radiation therapy, surgical therapy, androgen deprivation, as well as ablative therapy options for the treatment of prostate cancer as appropriate to his individual cancer situation. We discussed the risks and benefits of these options with regard to their impact on cancer control and also in terms of potential adverse events, complications, and impact on quality of life particularly related to urinary and sexual function. The patient was encouraged to ask questions throughout the discussion today and all questions were answered to his stated satisfaction. In addition, the patient was provided with and/or directed to  appropriate resources and literature for further education about prostate cancer and treatment options.   He plans to further consider his options. He will notify me if he is interested in surgical therapy. If he does proceed with surgical treatment, I will have him return for further discussion about surgery in detail and for an exam.   Cc: Dr. Zola Button  Dr. Tyler Pita  Dr. Festus Aloe  Dr. Viviana Simpler

## 2018-01-04 ENCOUNTER — Telehealth: Payer: Self-pay | Admitting: Medical Oncology

## 2018-01-04 NOTE — Telephone Encounter (Signed)
Mr. Lauf has decided he would like to move forward with ST-ADT and 8 weeks of radiation to treat his prostate cancer. Dr. Forrest Moron notified.

## 2018-01-04 NOTE — Progress Notes (Signed)
[facility] Psychosocial Distress Screening Spiritual Care  Met with Brently and [name] in [facility] to introduce [facility] team/resources, reviewing distress screen per protocol.  The patient scored a 0 on the Psychosocial Distress Thermometer which indicates minimal distress. Also assessed for distress and other psychosocial needs.   ONCBCN DISTRESS SCREENING 01/04/2018  Screening Type Initial Screening  Distress experienced in past week (1-10) 0  Referral to support programs Yes   Mr Goodson describes himself who takes life as it comes and trusts that problems that present will also have their resolutions in time. His faith and perspective help him cope with unforeseen hurdles. Pt's wife [name] reports having a worrier personality and tendency to feel more distress. Normalized feelings, providing empathic listening, emotional support, pastoral reflection, and encouragement to share and process together.   Follow up needed: No. Couple is aware of ongoing [facility] team/programming availability, but please also page if needs arise or circumstances change. Thank you!   [place], [place], [facility] Pager [phone] Voicemail [phone]

## 2018-01-08 ENCOUNTER — Telehealth: Payer: Self-pay | Admitting: Medical Oncology

## 2018-01-08 ENCOUNTER — Ambulatory Visit: Payer: Medicare Other | Admitting: Internal Medicine

## 2018-01-08 NOTE — Telephone Encounter (Signed)
Ricardo Reese called to inquire about hormone injection. I returned his call and left message that his insurance has to prior authorize the injection. He should receive a call from [facility] in a few days with an appointment. I asked him to call be back if he has not heard from them by Wednesday.

## 2018-01-22 ENCOUNTER — Telehealth: Payer: Self-pay | Admitting: Medical Oncology

## 2018-01-23 ENCOUNTER — Telehealth: Payer: Self-pay | Admitting: Medical Oncology

## 2018-01-23 ENCOUNTER — Telehealth: Payer: Self-pay | Admitting: *Deleted

## 2018-01-23 NOTE — Telephone Encounter (Signed)
CALLED PATIENT TO INFORM OF HORMONE INJ. FOR 02-02-18 - ARRIVAL TIME - 1 PM @ [facility], SPOKE WITH PATIENT'S WIFE- [name] AND SHE IS AWARE OF THIS APPT.

## 2018-01-23 NOTE — Telephone Encounter (Signed)
[name] called stating he has not been contacted with an appointment for ADT. I will follow up with Enid Derry and or Dr. Lyndal Rainbow office to get this schedule. He voiced understanding. I forwarded this information to [place] and she will follow up.

## 2018-01-23 NOTE — Telephone Encounter (Signed)
I left a message as follow up on our phone conversation 8/8 regarding ADT. ADT has been scheduled for 02/02/18 at 1 pm at [facility]. I informed him that we are in process of getting a date for gold markers/SpaceOAR. Radiation will being in approx 8 weeks from ADT injection.

## 2018-02-02 ENCOUNTER — Encounter: Payer: Self-pay | Admitting: Medical Oncology

## 2018-02-02 DIAGNOSIS — C61 Malignant neoplasm of prostate: Secondary | ICD-10-CM | POA: Diagnosis not present

## 2018-03-23 ENCOUNTER — Telehealth: Payer: Self-pay | Admitting: *Deleted

## 2018-03-23 NOTE — Telephone Encounter (Signed)
Called patient to inform of fid. Marker and space oar placement on 03-26-18 @ [facility] and his sim on 03-30-18 @ 11 am @ Dr. Johny Shears Office, spoke with patient and he is aware of these appts.

## 2018-03-26 ENCOUNTER — Other Ambulatory Visit: Payer: Self-pay | Admitting: Urology

## 2018-03-26 DIAGNOSIS — C61 Malignant neoplasm of prostate: Secondary | ICD-10-CM | POA: Diagnosis not present

## 2018-03-27 ENCOUNTER — Telehealth: Payer: Self-pay | Admitting: *Deleted

## 2018-03-27 NOTE — Telephone Encounter (Signed)
CALLED PATIENT TO INFORM OF APPTS. FOR 03-30-18, LVM FOR A RETURN CALL

## 2018-03-30 ENCOUNTER — Ambulatory Visit
Admission: RE | Admit: 2018-03-30 | Discharge: 2018-03-30 | Disposition: A | Discharge status: Home / Self Care | Payer: Medicare Other | Source: Ambulatory Visit | Attending: Radiation Oncology | Admitting: Radiation Oncology

## 2018-03-30 ENCOUNTER — Ambulatory Visit (HOSPITAL_COMMUNITY)
Admission: RE | Admit: 2018-03-30 | Discharge: 2018-03-30 | Disposition: A | Discharge status: Home / Self Care | Payer: Medicare Other | Source: Ambulatory Visit | Attending: Urology | Admitting: Urology

## 2018-03-30 ENCOUNTER — Encounter: Payer: Self-pay | Admitting: Medical Oncology

## 2018-03-30 DIAGNOSIS — Z510 Encounter for antineoplastic radiation therapy: Secondary | ICD-10-CM | POA: Insufficient documentation

## 2018-03-30 DIAGNOSIS — C61 Malignant neoplasm of prostate: Secondary | ICD-10-CM | POA: Insufficient documentation

## 2018-03-30 NOTE — Progress Notes (Signed)
  Radiation Oncology         [phone]) [phone] ________________________________  Name: Ricardo Reese MRN: [id number]  Date: 03/30/2018  DOB: September 27, 1950  SIMULATION AND TREATMENT PLANNING NOTE    ICD-10-CM   1. Malignant neoplasm of prostate ([place]) C61     DIAGNOSIS:  67 y.o. gentleman with stage T1c adenocarcinoma of the prostate with a Gleason's score of 4+3 and a PSA of 20.3  NARRATIVE:  The patient was brought to the [place].  Identity was confirmed.  All relevant records and images related to the planned course of therapy were reviewed.  The patient freely provided informed written consent to proceed with treatment after reviewing the details related to the planned course of therapy. The consent form was witnessed and verified by the simulation staff.  Then, the patient was set-up in a stable reproducible supine position for radiation therapy.  A vacuum lock pillow device was custom fabricated to position his legs in a reproducible immobilized position.  Then, I performed a urethrogram under sterile conditions to identify the prostatic apex.  CT images were obtained.  Surface markings were placed.  The CT images were loaded into the planning software.  Then the prostate target and avoidance structures including the rectum, bladder, bowel and hips were contoured.  Treatment planning then occurred.  The radiation prescription was entered and confirmed.  A total of one complex treatment devices were fabricated. I have requested : Intensity Modulated Radiotherapy (IMRT) is medically necessary for this case for the following reason:  Rectal sparing.Marland Kitchen  PLAN:  The patient will receive 45 Gy in 25 fractions of 1.8 Gy, followed by a boost to the prostate to a total dose of 75 Gy with 15 additional fractions of 2.0 Gy.  ________________________________  Sheral Apley Tammi Klippel, M.D.

## 2018-04-01 DIAGNOSIS — Z23 Encounter for immunization: Secondary | ICD-10-CM | POA: Diagnosis not present

## 2018-04-11 ENCOUNTER — Ambulatory Visit: Payer: Medicare Other

## 2018-04-11 DIAGNOSIS — Z510 Encounter for antineoplastic radiation therapy: Secondary | ICD-10-CM | POA: Diagnosis not present

## 2018-04-11 DIAGNOSIS — C61 Malignant neoplasm of prostate: Secondary | ICD-10-CM | POA: Diagnosis not present

## 2018-04-12 ENCOUNTER — Ambulatory Visit: Payer: Medicare Other

## 2018-04-13 ENCOUNTER — Ambulatory Visit: Payer: Medicare Other

## 2018-04-16 ENCOUNTER — Ambulatory Visit
Admission: RE | Admit: 2018-04-16 | Discharge: 2018-04-16 | Disposition: A | Discharge status: Home / Self Care | Payer: Medicare Other | Source: Ambulatory Visit | Attending: Radiation Oncology | Admitting: Radiation Oncology

## 2018-04-16 DIAGNOSIS — C61 Malignant neoplasm of prostate: Secondary | ICD-10-CM | POA: Insufficient documentation

## 2018-04-16 DIAGNOSIS — Z510 Encounter for antineoplastic radiation therapy: Secondary | ICD-10-CM | POA: Diagnosis not present

## 2018-04-17 ENCOUNTER — Ambulatory Visit
Admission: RE | Admit: 2018-04-17 | Discharge: 2018-04-17 | Disposition: A | Discharge status: Home / Self Care | Payer: Medicare Other | Source: Ambulatory Visit | Attending: Radiation Oncology | Admitting: Radiation Oncology

## 2018-04-17 DIAGNOSIS — Z510 Encounter for antineoplastic radiation therapy: Secondary | ICD-10-CM | POA: Diagnosis not present

## 2018-04-17 DIAGNOSIS — C61 Malignant neoplasm of prostate: Secondary | ICD-10-CM | POA: Diagnosis not present

## 2018-04-18 ENCOUNTER — Ambulatory Visit
Admission: RE | Admit: 2018-04-18 | Discharge: 2018-04-18 | Disposition: A | Discharge status: Home / Self Care | Payer: Medicare Other | Source: Ambulatory Visit | Attending: Radiation Oncology | Admitting: Radiation Oncology

## 2018-04-18 DIAGNOSIS — C61 Malignant neoplasm of prostate: Secondary | ICD-10-CM | POA: Diagnosis not present

## 2018-04-18 DIAGNOSIS — Z510 Encounter for antineoplastic radiation therapy: Secondary | ICD-10-CM | POA: Diagnosis not present

## 2018-04-19 ENCOUNTER — Ambulatory Visit
Admission: RE | Admit: 2018-04-19 | Discharge: 2018-04-19 | Disposition: A | Discharge status: Home / Self Care | Payer: Medicare Other | Source: Ambulatory Visit | Attending: Radiation Oncology | Admitting: Radiation Oncology

## 2018-04-19 DIAGNOSIS — C61 Malignant neoplasm of prostate: Secondary | ICD-10-CM | POA: Diagnosis not present

## 2018-04-19 DIAGNOSIS — Z510 Encounter for antineoplastic radiation therapy: Secondary | ICD-10-CM | POA: Diagnosis not present

## 2018-04-20 ENCOUNTER — Ambulatory Visit
Admission: RE | Admit: 2018-04-20 | Discharge: 2018-04-20 | Disposition: A | Discharge status: Home / Self Care | Payer: Medicare Other | Source: Ambulatory Visit | Attending: Radiation Oncology | Admitting: Radiation Oncology

## 2018-04-20 DIAGNOSIS — Z510 Encounter for antineoplastic radiation therapy: Secondary | ICD-10-CM | POA: Diagnosis not present

## 2018-04-20 DIAGNOSIS — C61 Malignant neoplasm of prostate: Secondary | ICD-10-CM | POA: Diagnosis not present

## 2018-04-23 ENCOUNTER — Ambulatory Visit
Admission: RE | Admit: 2018-04-23 | Discharge: 2018-04-23 | Disposition: A | Discharge status: Home / Self Care | Payer: Medicare Other | Source: Ambulatory Visit | Attending: Radiation Oncology | Admitting: Radiation Oncology

## 2018-04-23 DIAGNOSIS — C61 Malignant neoplasm of prostate: Secondary | ICD-10-CM | POA: Diagnosis not present

## 2018-04-23 DIAGNOSIS — Z510 Encounter for antineoplastic radiation therapy: Secondary | ICD-10-CM | POA: Diagnosis not present

## 2018-04-24 ENCOUNTER — Encounter: Payer: Self-pay | Admitting: Medical Oncology

## 2018-04-24 ENCOUNTER — Ambulatory Visit
Admission: RE | Admit: 2018-04-24 | Discharge: 2018-04-24 | Disposition: A | Discharge status: Home / Self Care | Payer: Medicare Other | Source: Ambulatory Visit | Attending: Radiation Oncology | Admitting: Radiation Oncology

## 2018-04-24 DIAGNOSIS — C61 Malignant neoplasm of prostate: Secondary | ICD-10-CM | POA: Diagnosis not present

## 2018-04-24 DIAGNOSIS — Z510 Encounter for antineoplastic radiation therapy: Secondary | ICD-10-CM | POA: Diagnosis not present

## 2018-04-25 ENCOUNTER — Ambulatory Visit
Admission: RE | Admit: 2018-04-25 | Discharge: 2018-04-25 | Disposition: A | Discharge status: Home / Self Care | Payer: Medicare Other | Source: Ambulatory Visit | Attending: Radiation Oncology | Admitting: Radiation Oncology

## 2018-04-25 DIAGNOSIS — Z510 Encounter for antineoplastic radiation therapy: Secondary | ICD-10-CM | POA: Diagnosis not present

## 2018-04-25 DIAGNOSIS — C61 Malignant neoplasm of prostate: Secondary | ICD-10-CM | POA: Diagnosis not present

## 2018-04-26 ENCOUNTER — Ambulatory Visit
Admission: RE | Admit: 2018-04-26 | Discharge: 2018-04-26 | Disposition: A | Discharge status: Home / Self Care | Payer: Medicare Other | Source: Ambulatory Visit | Attending: Radiation Oncology | Admitting: Radiation Oncology

## 2018-04-26 DIAGNOSIS — Z510 Encounter for antineoplastic radiation therapy: Secondary | ICD-10-CM | POA: Diagnosis not present

## 2018-04-26 DIAGNOSIS — C61 Malignant neoplasm of prostate: Secondary | ICD-10-CM | POA: Diagnosis not present

## 2018-04-26 NOTE — Progress Notes (Signed)
Patient states he is doing well with radiation. He has completed his first week. I will continue to follow.

## 2018-04-27 ENCOUNTER — Ambulatory Visit
Admission: RE | Admit: 2018-04-27 | Discharge: 2018-04-27 | Disposition: A | Discharge status: Home / Self Care | Payer: Medicare Other | Source: Ambulatory Visit | Attending: Radiation Oncology | Admitting: Radiation Oncology

## 2018-04-27 DIAGNOSIS — C61 Malignant neoplasm of prostate: Secondary | ICD-10-CM | POA: Diagnosis not present

## 2018-04-27 DIAGNOSIS — Z510 Encounter for antineoplastic radiation therapy: Secondary | ICD-10-CM | POA: Diagnosis not present

## 2018-04-30 ENCOUNTER — Ambulatory Visit
Admission: RE | Admit: 2018-04-30 | Discharge: 2018-04-30 | Disposition: A | Discharge status: Home / Self Care | Payer: Medicare Other | Source: Ambulatory Visit | Attending: Radiation Oncology | Admitting: Radiation Oncology

## 2018-04-30 DIAGNOSIS — Z510 Encounter for antineoplastic radiation therapy: Secondary | ICD-10-CM | POA: Diagnosis not present

## 2018-04-30 DIAGNOSIS — C61 Malignant neoplasm of prostate: Secondary | ICD-10-CM | POA: Diagnosis not present

## 2018-05-01 ENCOUNTER — Ambulatory Visit
Admission: RE | Admit: 2018-05-01 | Discharge: 2018-05-01 | Disposition: A | Discharge status: Home / Self Care | Payer: Medicare Other | Source: Ambulatory Visit | Attending: Radiation Oncology | Admitting: Radiation Oncology

## 2018-05-01 DIAGNOSIS — Z510 Encounter for antineoplastic radiation therapy: Secondary | ICD-10-CM | POA: Diagnosis not present

## 2018-05-01 DIAGNOSIS — C61 Malignant neoplasm of prostate: Secondary | ICD-10-CM | POA: Diagnosis not present

## 2018-05-02 ENCOUNTER — Ambulatory Visit
Admission: RE | Admit: 2018-05-02 | Discharge: 2018-05-02 | Disposition: A | Discharge status: Home / Self Care | Payer: Medicare Other | Source: Ambulatory Visit | Attending: Radiation Oncology | Admitting: Radiation Oncology

## 2018-05-02 DIAGNOSIS — C61 Malignant neoplasm of prostate: Secondary | ICD-10-CM | POA: Diagnosis not present

## 2018-05-02 DIAGNOSIS — Z510 Encounter for antineoplastic radiation therapy: Secondary | ICD-10-CM | POA: Diagnosis not present

## 2018-05-03 ENCOUNTER — Ambulatory Visit
Admission: RE | Admit: 2018-05-03 | Discharge: 2018-05-03 | Disposition: A | Discharge status: Home / Self Care | Payer: Medicare Other | Source: Ambulatory Visit | Attending: Radiation Oncology | Admitting: Radiation Oncology

## 2018-05-03 DIAGNOSIS — Z510 Encounter for antineoplastic radiation therapy: Secondary | ICD-10-CM | POA: Diagnosis not present

## 2018-05-03 DIAGNOSIS — C61 Malignant neoplasm of prostate: Secondary | ICD-10-CM | POA: Diagnosis not present

## 2018-05-04 ENCOUNTER — Ambulatory Visit (INDEPENDENT_AMBULATORY_CARE_PROVIDER_SITE_OTHER): Payer: Medicare Other | Admitting: Internal Medicine

## 2018-05-04 ENCOUNTER — Ambulatory Visit
Admission: RE | Admit: 2018-05-04 | Discharge: 2018-05-04 | Disposition: A | Discharge status: Home / Self Care | Payer: Medicare Other | Source: Ambulatory Visit | Attending: Radiation Oncology | Admitting: Radiation Oncology

## 2018-05-04 ENCOUNTER — Encounter: Payer: Self-pay | Admitting: Internal Medicine

## 2018-05-04 VITALS — BP 134/78 | HR 53 | Temp 97.8°F | Resp 16 | Ht 71.0 in | Wt 209.0 lb

## 2018-05-04 DIAGNOSIS — E119 Type 2 diabetes mellitus without complications: Secondary | ICD-10-CM

## 2018-05-04 DIAGNOSIS — E785 Hyperlipidemia, unspecified: Secondary | ICD-10-CM | POA: Diagnosis not present

## 2018-05-04 DIAGNOSIS — C61 Malignant neoplasm of prostate: Secondary | ICD-10-CM | POA: Diagnosis not present

## 2018-05-04 DIAGNOSIS — I10 Essential (primary) hypertension: Secondary | ICD-10-CM | POA: Diagnosis not present

## 2018-05-04 DIAGNOSIS — Z510 Encounter for antineoplastic radiation therapy: Secondary | ICD-10-CM | POA: Diagnosis not present

## 2018-05-04 LAB — POCT GLYCOSYLATED HEMOGLOBIN (HGB A1C)
HBA1C, POC (CONTROLLED DIABETIC RANGE): 6 % (ref 0.0–7.0)
HBA1C, POC (PREDIABETIC RANGE): 6 % (ref 5.7–6.4)
HEMOGLOBIN A1C: 6 % — AB (ref 4.0–5.6)
HbA1c POC (<> result, manual entry): 6 % (ref 4.0–5.6)

## 2018-05-04 LAB — HM DIABETES FOOT EXAM

## 2018-05-04 NOTE — Assessment & Plan Note (Signed)
BP Readings from Last 3 Encounters:  05/04/18 134/78  01/02/18 (!) 159/64  12/07/17 138/70   Good control

## 2018-05-04 NOTE — Assessment & Plan Note (Signed)
Has started lupron and in the middle of RT

## 2018-05-04 NOTE — Progress Notes (Signed)
Subjective:    Patient ID: Ricardo Reese, male    DOB: 25-Aug-1950, 67 y.o.   MRN: [id number]  HPI Here for follow up of diabetes  Did get diagnosed with prostate cancer Going through his RT now  He has finished the diabetes classes Eating better Exercising regularly---walking, yard work, Tax inspector sugars every other day Usually 98-104 mostly No foot numbness or pain  Current Outpatient Medications on File Prior to Visit  Medication Sig Dispense Refill  . amLODipine (NORVASC) 5 MG tablet Take 1 tablet (5 mg total) by mouth daily. 90 tablet 3  . B Complex-C (SUPER B COMPLEX PO) Take 1 tablet by mouth daily.     . fish oil-omega-3 fatty acids 1000 MG capsule Take 1 g by mouth daily.    Marland Kitchen glucose blood (CONTOUR NEXT TEST) test strip Use to obtain blood sugar once a day. Dx Code E11.9 100 each 3  . losartan-hydrochlorothiazide (HYZAAR) 100-25 MG tablet Take 1 tablet by mouth daily. 90 tablet 3  . metFORMIN (GLUCOPHAGE) 500 MG tablet Take 1 tablet (500 mg total) by mouth 2 (two) times daily with a meal. 180 tablet 3  . pravastatin (PRAVACHOL) 40 MG tablet Take 1 tablet (40 mg total) by mouth daily. 90 tablet 3  . sildenafil (REVATIO) 20 MG tablet Take 3-5 tablets (60-100 mg total) by mouth daily as needed. 50 tablet 11   No current facility-administered medications on file prior to visit.     Allergies  Allergen Reactions  . No Known Allergies     Past Medical History:  Diagnosis Date  . Diabetes mellitus without complication ([place])   . History of kidney stones    has had twice  . Hyperlipidemia   . Hypertension   . Impaired fasting glucose   . Kidney stone 1983  . Melanoma of back [facility]) 2013   Dr Evorn Gong  . Pneumonia    walking back in 2002  . Prostate cancer ([place])   . 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 KNEE SURGERY  1969   cartilage removal right knee   . melanoma back  09/2010  .  PROSTATE BIOPSY    . 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   Procedure: LEFT TOTAL HIP ARTHROPLASTY ANTERIOR APPROACH;  Surgeon: Mcarthur Rossetti, MD;  Location: [place];  Service: Orthopedics;  Laterality: Left;    Family History  Problem Relation Age of Onset  . Stroke Father   . Lung cancer Father   . Prostate cancer Brother   . Heart disease Brother   . Diabetes Neg Hx   . Colon cancer Neg Hx   . Stomach cancer Neg Hx   . Esophageal cancer Neg Hx   . Rectal cancer Neg Hx     Social History   Socioeconomic History  . Marital status: Married    Spouse name: [name]  . Number of children: 2  . Years of education: Not on file  . Highest education level: Not on file  Occupational History  . 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 Financial resource strain: Not on file  . Food insecurity:    Worry: Not on file    Inability: Not on file  . Transportation needs:    Medical: Not on file  Non-medical: Not on file  Tobacco Use  . Smoking status: Never Smoker  . Smokeless tobacco: Never Used  Substance and Sexual Activity  . Alcohol use: Yes    Alcohol/week: 3.0 - 4.0 standard drinks    Types: 3 - 4 Cans of beer per week    Comment: occasional beer  . Drug use: No  . Sexual activity: Yes  Lifestyle  . Physical activity:    Days per week: Not on file    Minutes per session: Not on file  . Stress: Not on file  Relationships  . Social connections:    Talks on phone: Not on file    Gets together: Not on file    Attends religious service: Not on file    Active member of club or organization: Not on file    Attends meetings of clubs or organizations: Not on file    Relationship status: Not on file  . Intimate partner violence:    Fear of current or ex partner: Not on file    Emotionally abused: Not on file    Physically abused: Not on file    Forced sexual activity: Not on file    Other Topics Concern  . Not on file  Social History Narrative   Has living will   Wife is health care POA--- daughters next   Would accept resuscitation   No tube feeds if cognitively unaware   Review of Systems Has lost 20# from baseline Sleeping better---wife notes that he snores less    Objective:   Physical Exam  Constitutional: He appears well-developed. No distress.  Neck: No thyromegaly present.  Cardiovascular: Normal rate, regular rhythm, normal heart sounds and intact distal pulses. Exam reveals no gallop.  No murmur heard. Respiratory: Effort normal and breath sounds normal. No respiratory distress. He has no wheezes. He has no rales.  GI: Soft. There is no tenderness.  Musculoskeletal: He exhibits no edema or tenderness.  Lymphadenopathy:    He has no cervical adenopathy.  Neurological:  Normal sensation in feet  Skin: No rash noted.  No foot lesions  Psychiatric: He has a normal mood and affect. His behavior is normal.           Assessment & Plan:

## 2018-05-04 NOTE — Assessment & Plan Note (Addendum)
Seems to have great control now Will check A1c Get eye exam  Lab Results  Component Value Date   HGBA1C 6.0 (A) 05/04/2018   HGBA1C 6.0 05/04/2018   HGBA1C 6.0 05/04/2018   HGBA1C 6.0 05/04/2018   Great job!

## 2018-05-04 NOTE — Assessment & Plan Note (Signed)
Is already on statin

## 2018-05-06 ENCOUNTER — Ambulatory Visit
Admission: RE | Admit: 2018-05-06 | Discharge: 2018-05-06 | Disposition: A | Discharge status: Home / Self Care | Payer: Medicare Other | Source: Ambulatory Visit | Attending: Radiation Oncology | Admitting: Radiation Oncology

## 2018-05-06 DIAGNOSIS — C61 Malignant neoplasm of prostate: Secondary | ICD-10-CM | POA: Diagnosis not present

## 2018-05-06 DIAGNOSIS — Z510 Encounter for antineoplastic radiation therapy: Secondary | ICD-10-CM | POA: Diagnosis not present

## 2018-05-07 ENCOUNTER — Ambulatory Visit
Admission: RE | Admit: 2018-05-07 | Discharge: 2018-05-07 | Disposition: A | Discharge status: Home / Self Care | Payer: Medicare Other | Source: Ambulatory Visit | Attending: Radiation Oncology | Admitting: Radiation Oncology

## 2018-05-07 DIAGNOSIS — Z510 Encounter for antineoplastic radiation therapy: Secondary | ICD-10-CM | POA: Diagnosis not present

## 2018-05-07 DIAGNOSIS — C61 Malignant neoplasm of prostate: Secondary | ICD-10-CM | POA: Diagnosis not present

## 2018-05-08 ENCOUNTER — Ambulatory Visit
Admission: RE | Admit: 2018-05-08 | Discharge: 2018-05-08 | Disposition: A | Discharge status: Home / Self Care | Payer: Medicare Other | Source: Ambulatory Visit | Attending: Radiation Oncology | Admitting: Radiation Oncology

## 2018-05-08 DIAGNOSIS — Z510 Encounter for antineoplastic radiation therapy: Secondary | ICD-10-CM | POA: Diagnosis not present

## 2018-05-08 DIAGNOSIS — C61 Malignant neoplasm of prostate: Secondary | ICD-10-CM | POA: Diagnosis not present

## 2018-05-09 ENCOUNTER — Ambulatory Visit: Admission: RE | Admit: 2018-05-09 | Payer: Medicare Other | Source: Ambulatory Visit

## 2018-05-14 ENCOUNTER — Ambulatory Visit
Admission: RE | Admit: 2018-05-14 | Discharge: 2018-05-14 | Disposition: A | Discharge status: Home / Self Care | Payer: Medicare Other | Source: Ambulatory Visit | Attending: Radiation Oncology | Admitting: Radiation Oncology

## 2018-05-14 DIAGNOSIS — C61 Malignant neoplasm of prostate: Secondary | ICD-10-CM | POA: Insufficient documentation

## 2018-05-14 DIAGNOSIS — Z510 Encounter for antineoplastic radiation therapy: Secondary | ICD-10-CM | POA: Insufficient documentation

## 2018-05-14 IMAGING — RF DG HIP (WITH PELVIS) OPERATIVE*L*
1 series · 4 of 4 positions shown · non-contrast
Comparison: August 05, 2016

FLUOROSCOPY TIME:  0 minutes 22 seconds; 4 acquired images

CLINICAL DATA: Status post total hip replacement

EXAM:
DG C-ARM 61-120 MIN; OPERATIVE LEFT HIP

[Series 1: run · 4 of 4 slices shown]
[im 1/4]
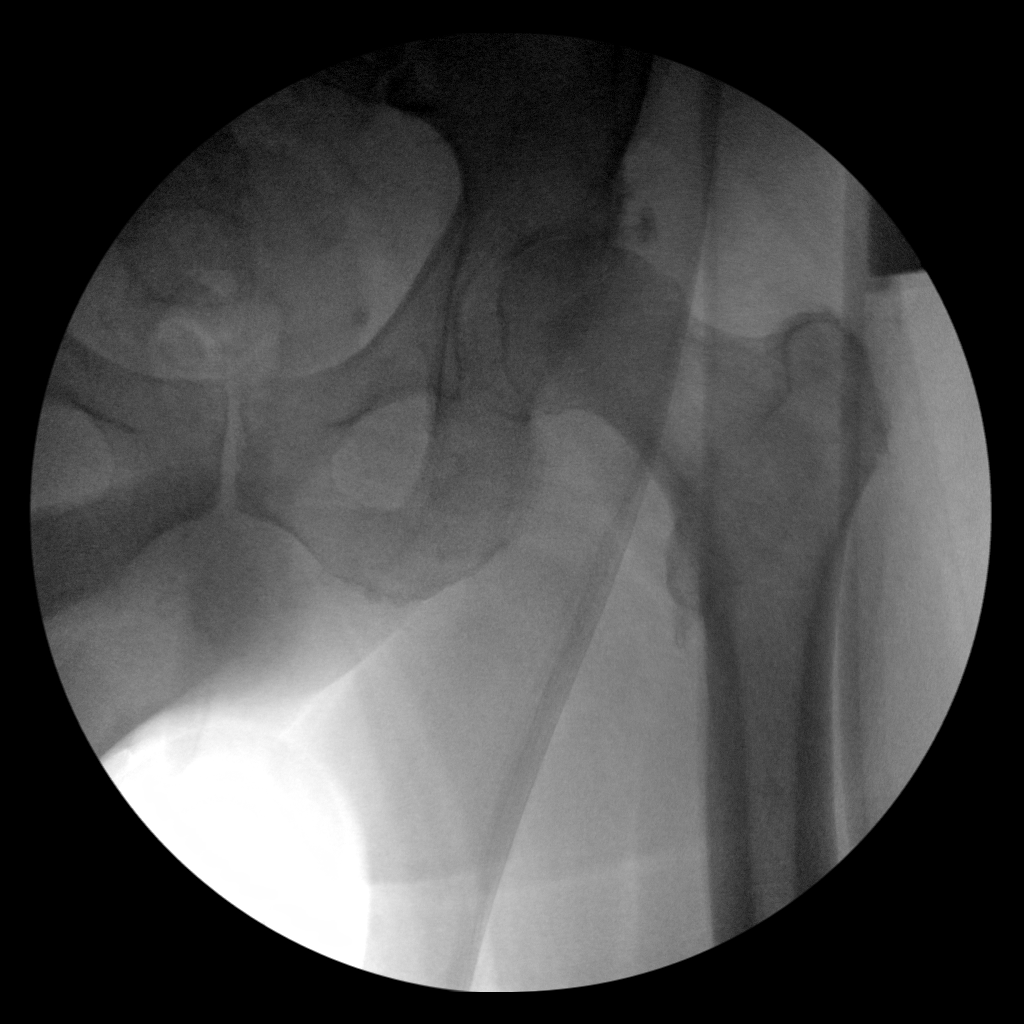
[im 2/4]
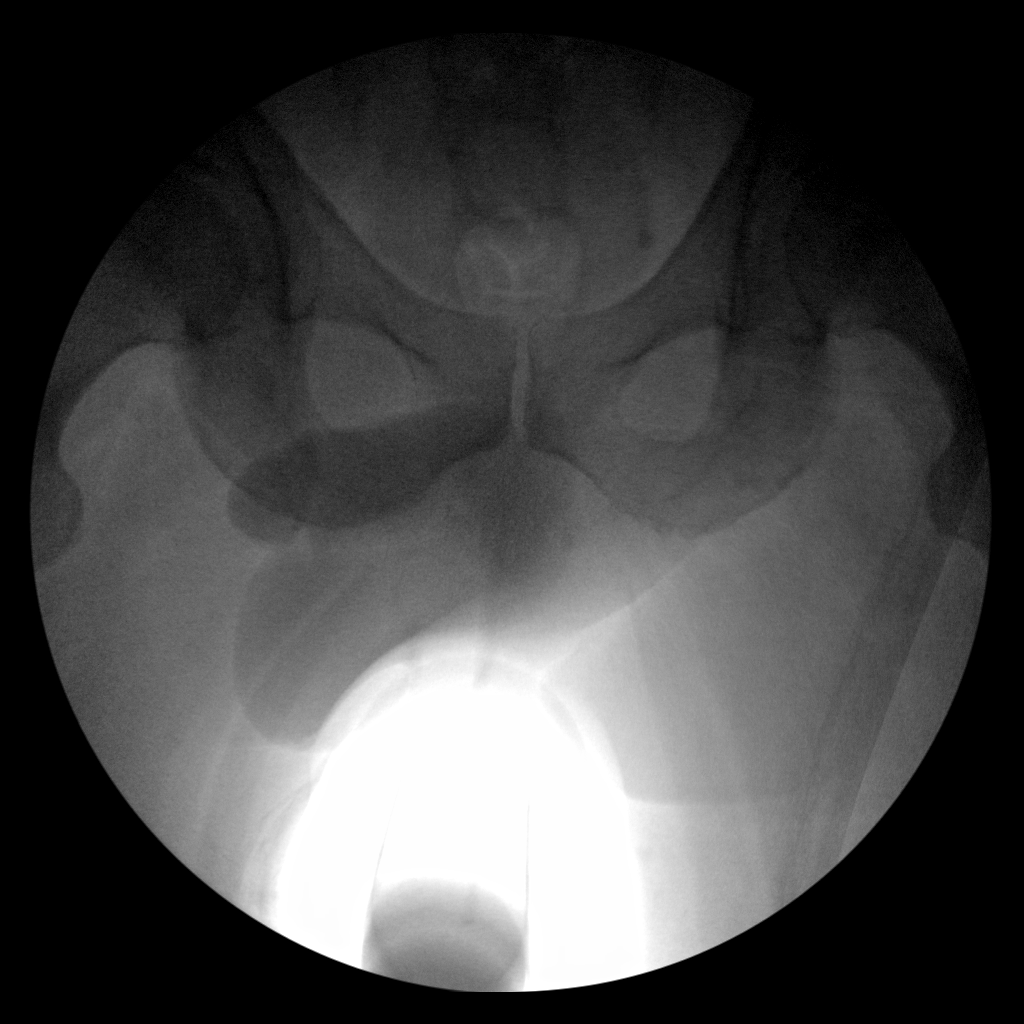
[im 3/4]
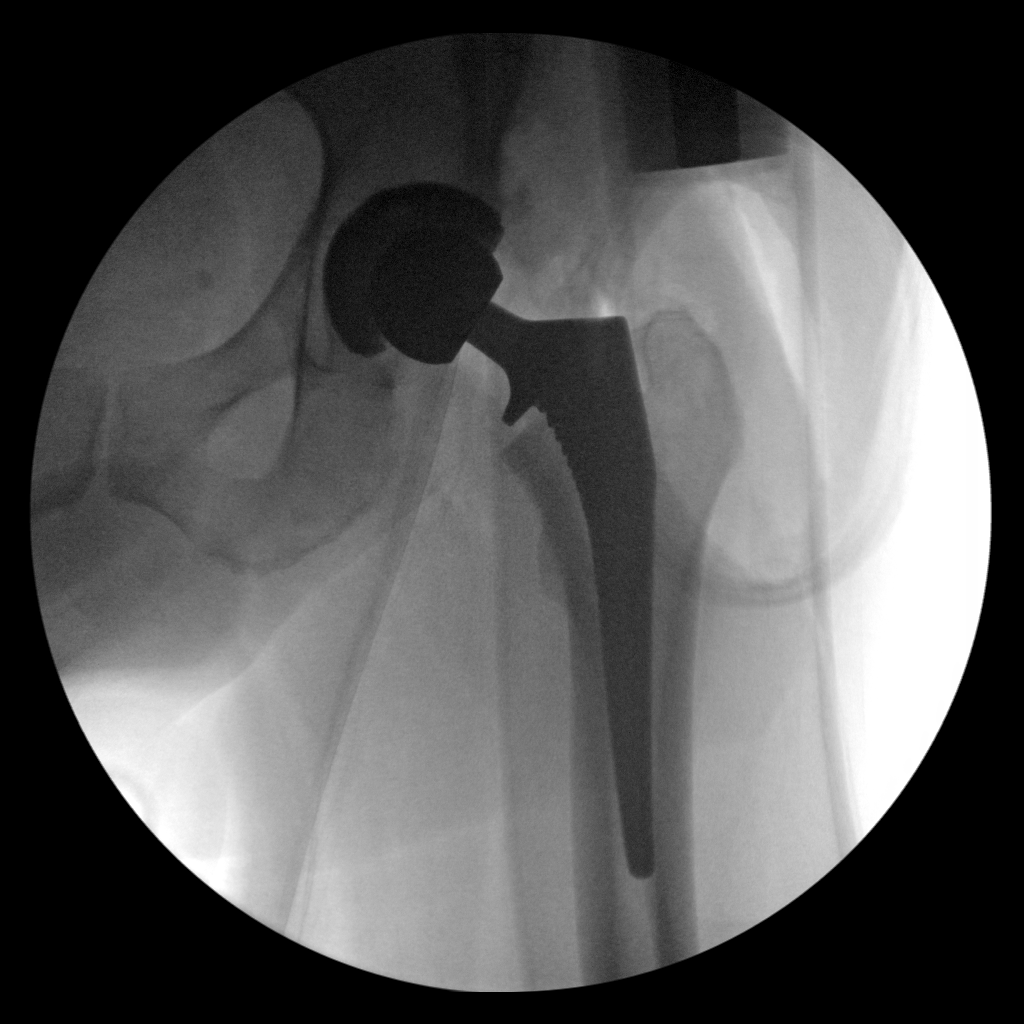
[im 4/4]
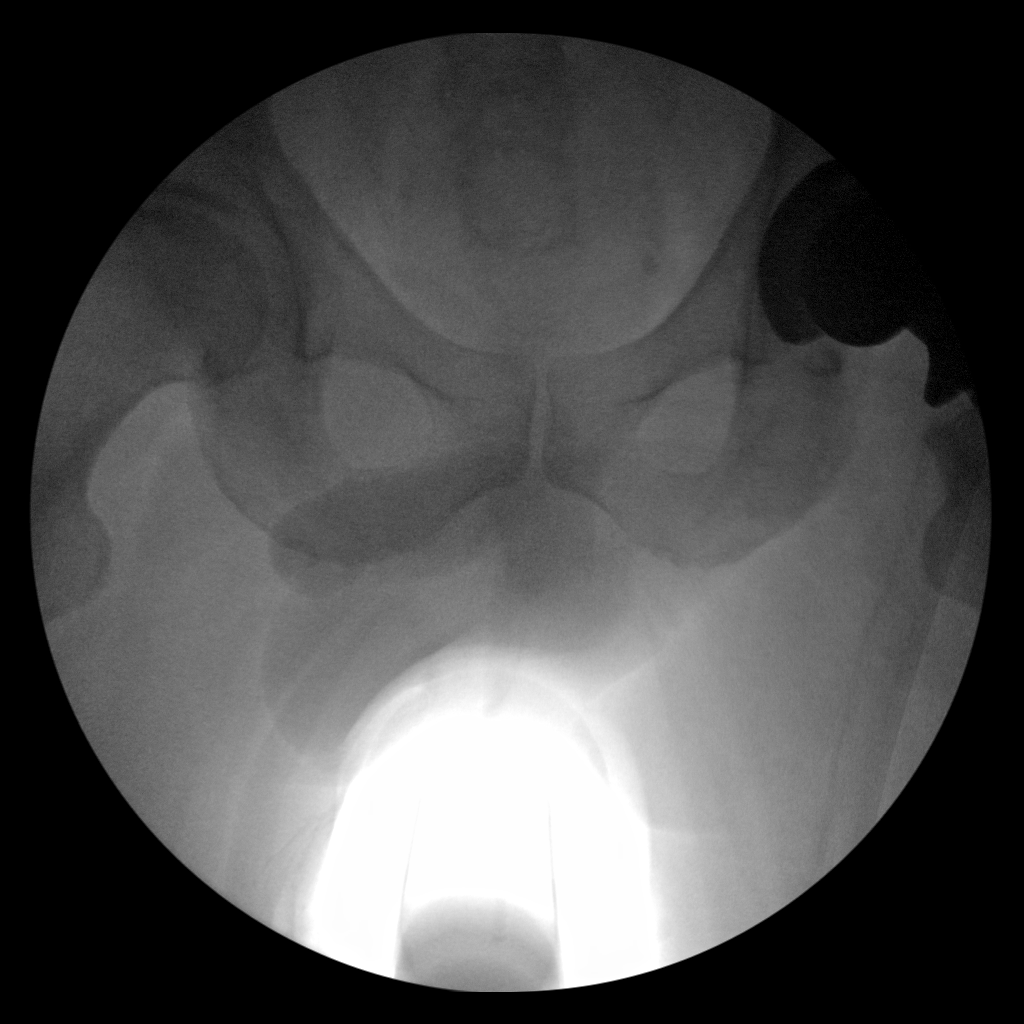

[4 of 4 positions shown; findings below may reference images not displayed]

FINDINGS: Frontal views obtained. Initial image shows extensive arthropathy in
the left hip joint. Subsequent imaging shows total hip replacement
on the left with prosthetic components appearing well-seated. No
acute fracture or dislocation.
IMPRESSION: Status post left total hip replacement with prosthetic components
well-seated on frontal view. No evident fracture or dislocation.

## 2018-05-15 ENCOUNTER — Ambulatory Visit
Admission: RE | Admit: 2018-05-15 | Discharge: 2018-05-15 | Disposition: A | Discharge status: Home / Self Care | Payer: Medicare Other | Source: Ambulatory Visit | Attending: Radiation Oncology | Admitting: Radiation Oncology

## 2018-05-15 DIAGNOSIS — C61 Malignant neoplasm of prostate: Secondary | ICD-10-CM | POA: Diagnosis not present

## 2018-05-15 DIAGNOSIS — Z510 Encounter for antineoplastic radiation therapy: Secondary | ICD-10-CM | POA: Diagnosis not present

## 2018-05-16 ENCOUNTER — Ambulatory Visit
Admission: RE | Admit: 2018-05-16 | Discharge: 2018-05-16 | Disposition: A | Discharge status: Home / Self Care | Payer: Medicare Other | Source: Ambulatory Visit | Attending: Radiation Oncology | Admitting: Radiation Oncology

## 2018-05-16 DIAGNOSIS — C61 Malignant neoplasm of prostate: Secondary | ICD-10-CM | POA: Diagnosis not present

## 2018-05-16 DIAGNOSIS — Z510 Encounter for antineoplastic radiation therapy: Secondary | ICD-10-CM | POA: Diagnosis not present

## 2018-05-17 ENCOUNTER — Ambulatory Visit
Admission: RE | Admit: 2018-05-17 | Discharge: 2018-05-17 | Disposition: A | Discharge status: Home / Self Care | Payer: Medicare Other | Source: Ambulatory Visit | Attending: Radiation Oncology | Admitting: Radiation Oncology

## 2018-05-17 DIAGNOSIS — Z510 Encounter for antineoplastic radiation therapy: Secondary | ICD-10-CM | POA: Diagnosis not present

## 2018-05-17 DIAGNOSIS — C61 Malignant neoplasm of prostate: Secondary | ICD-10-CM | POA: Diagnosis not present

## 2018-05-18 ENCOUNTER — Ambulatory Visit
Admission: RE | Admit: 2018-05-18 | Discharge: 2018-05-18 | Disposition: A | Discharge status: Home / Self Care | Payer: Medicare Other | Source: Ambulatory Visit | Attending: Radiation Oncology | Admitting: Radiation Oncology

## 2018-05-18 DIAGNOSIS — C61 Malignant neoplasm of prostate: Secondary | ICD-10-CM | POA: Diagnosis not present

## 2018-05-18 DIAGNOSIS — Z510 Encounter for antineoplastic radiation therapy: Secondary | ICD-10-CM | POA: Diagnosis not present

## 2018-05-21 ENCOUNTER — Ambulatory Visit: Payer: Medicare Other

## 2018-05-21 ENCOUNTER — Ambulatory Visit
Admission: RE | Admit: 2018-05-21 | Discharge: 2018-05-21 | Disposition: A | Discharge status: Home / Self Care | Payer: Medicare Other | Source: Ambulatory Visit | Attending: Radiation Oncology | Admitting: Radiation Oncology

## 2018-05-21 DIAGNOSIS — C61 Malignant neoplasm of prostate: Secondary | ICD-10-CM | POA: Diagnosis not present

## 2018-05-21 DIAGNOSIS — Z510 Encounter for antineoplastic radiation therapy: Secondary | ICD-10-CM | POA: Diagnosis not present

## 2018-05-22 ENCOUNTER — Ambulatory Visit: Payer: Medicare Other

## 2018-05-22 ENCOUNTER — Ambulatory Visit
Admission: RE | Admit: 2018-05-22 | Discharge: 2018-05-22 | Disposition: A | Discharge status: Home / Self Care | Payer: Medicare Other | Source: Ambulatory Visit | Attending: Radiation Oncology | Admitting: Radiation Oncology

## 2018-05-22 DIAGNOSIS — C61 Malignant neoplasm of prostate: Secondary | ICD-10-CM | POA: Diagnosis not present

## 2018-05-22 DIAGNOSIS — Z510 Encounter for antineoplastic radiation therapy: Secondary | ICD-10-CM | POA: Diagnosis not present

## 2018-05-23 ENCOUNTER — Ambulatory Visit
Admission: RE | Admit: 2018-05-23 | Discharge: 2018-05-23 | Disposition: A | Discharge status: Home / Self Care | Payer: Medicare Other | Source: Ambulatory Visit | Attending: Radiation Oncology | Admitting: Radiation Oncology

## 2018-05-23 ENCOUNTER — Ambulatory Visit: Payer: Medicare Other

## 2018-05-23 DIAGNOSIS — Z510 Encounter for antineoplastic radiation therapy: Secondary | ICD-10-CM | POA: Diagnosis not present

## 2018-05-23 DIAGNOSIS — C61 Malignant neoplasm of prostate: Secondary | ICD-10-CM | POA: Diagnosis not present

## 2018-05-24 ENCOUNTER — Ambulatory Visit
Admission: RE | Admit: 2018-05-24 | Discharge: 2018-05-24 | Disposition: A | Discharge status: Home / Self Care | Payer: Medicare Other | Source: Ambulatory Visit | Attending: Radiation Oncology | Admitting: Radiation Oncology

## 2018-05-24 DIAGNOSIS — Z510 Encounter for antineoplastic radiation therapy: Secondary | ICD-10-CM | POA: Diagnosis not present

## 2018-05-24 DIAGNOSIS — C61 Malignant neoplasm of prostate: Secondary | ICD-10-CM | POA: Diagnosis not present

## 2018-05-25 ENCOUNTER — Ambulatory Visit
Admission: RE | Admit: 2018-05-25 | Discharge: 2018-05-25 | Disposition: A | Discharge status: Home / Self Care | Payer: Medicare Other | Source: Ambulatory Visit | Attending: Radiation Oncology | Admitting: Radiation Oncology

## 2018-05-25 DIAGNOSIS — Z510 Encounter for antineoplastic radiation therapy: Secondary | ICD-10-CM | POA: Diagnosis not present

## 2018-05-25 DIAGNOSIS — C61 Malignant neoplasm of prostate: Secondary | ICD-10-CM | POA: Diagnosis not present

## 2018-05-28 ENCOUNTER — Ambulatory Visit
Admission: RE | Admit: 2018-05-28 | Discharge: 2018-05-28 | Disposition: A | Discharge status: Home / Self Care | Payer: Medicare Other | Source: Ambulatory Visit | Attending: Radiation Oncology | Admitting: Radiation Oncology

## 2018-05-28 DIAGNOSIS — C61 Malignant neoplasm of prostate: Secondary | ICD-10-CM | POA: Diagnosis not present

## 2018-05-28 DIAGNOSIS — Z510 Encounter for antineoplastic radiation therapy: Secondary | ICD-10-CM | POA: Diagnosis not present

## 2018-05-29 ENCOUNTER — Ambulatory Visit
Admission: RE | Admit: 2018-05-29 | Discharge: 2018-05-29 | Disposition: A | Discharge status: Home / Self Care | Payer: Medicare Other | Source: Ambulatory Visit | Attending: Radiation Oncology | Admitting: Radiation Oncology

## 2018-05-29 DIAGNOSIS — C61 Malignant neoplasm of prostate: Secondary | ICD-10-CM | POA: Diagnosis not present

## 2018-05-29 DIAGNOSIS — Z510 Encounter for antineoplastic radiation therapy: Secondary | ICD-10-CM | POA: Diagnosis not present

## 2018-05-30 ENCOUNTER — Ambulatory Visit
Admission: RE | Admit: 2018-05-30 | Discharge: 2018-05-30 | Disposition: A | Discharge status: Home / Self Care | Payer: Medicare Other | Source: Ambulatory Visit | Attending: Radiation Oncology | Admitting: Radiation Oncology

## 2018-05-30 DIAGNOSIS — C61 Malignant neoplasm of prostate: Secondary | ICD-10-CM | POA: Diagnosis not present

## 2018-05-30 DIAGNOSIS — Z510 Encounter for antineoplastic radiation therapy: Secondary | ICD-10-CM | POA: Diagnosis not present

## 2018-05-31 ENCOUNTER — Ambulatory Visit
Admission: RE | Admit: 2018-05-31 | Discharge: 2018-05-31 | Disposition: A | Discharge status: Home / Self Care | Payer: Medicare Other | Source: Ambulatory Visit | Attending: Radiation Oncology | Admitting: Radiation Oncology

## 2018-05-31 DIAGNOSIS — C61 Malignant neoplasm of prostate: Secondary | ICD-10-CM | POA: Diagnosis not present

## 2018-05-31 DIAGNOSIS — Z510 Encounter for antineoplastic radiation therapy: Secondary | ICD-10-CM | POA: Diagnosis not present

## 2018-06-01 ENCOUNTER — Ambulatory Visit
Admission: RE | Admit: 2018-06-01 | Discharge: 2018-06-01 | Disposition: A | Discharge status: Home / Self Care | Payer: Medicare Other | Source: Ambulatory Visit | Attending: Radiation Oncology | Admitting: Radiation Oncology

## 2018-06-01 DIAGNOSIS — C61 Malignant neoplasm of prostate: Secondary | ICD-10-CM | POA: Diagnosis not present

## 2018-06-01 DIAGNOSIS — Z510 Encounter for antineoplastic radiation therapy: Secondary | ICD-10-CM | POA: Diagnosis not present

## 2018-06-04 ENCOUNTER — Ambulatory Visit
Admission: RE | Admit: 2018-06-04 | Discharge: 2018-06-04 | Disposition: A | Discharge status: Home / Self Care | Payer: Medicare Other | Source: Ambulatory Visit | Attending: Radiation Oncology | Admitting: Radiation Oncology

## 2018-06-04 DIAGNOSIS — C61 Malignant neoplasm of prostate: Secondary | ICD-10-CM | POA: Diagnosis not present

## 2018-06-04 DIAGNOSIS — Z510 Encounter for antineoplastic radiation therapy: Secondary | ICD-10-CM | POA: Diagnosis not present

## 2018-06-05 ENCOUNTER — Ambulatory Visit
Admission: RE | Admit: 2018-06-05 | Discharge: 2018-06-05 | Disposition: A | Discharge status: Home / Self Care | Payer: Medicare Other | Source: Ambulatory Visit | Attending: Radiation Oncology | Admitting: Radiation Oncology

## 2018-06-05 DIAGNOSIS — C61 Malignant neoplasm of prostate: Secondary | ICD-10-CM | POA: Diagnosis not present

## 2018-06-05 DIAGNOSIS — Z510 Encounter for antineoplastic radiation therapy: Secondary | ICD-10-CM | POA: Diagnosis not present

## 2018-06-07 ENCOUNTER — Ambulatory Visit: Payer: Medicare Other

## 2018-06-08 ENCOUNTER — Ambulatory Visit: Payer: Medicare Other

## 2018-06-08 ENCOUNTER — Ambulatory Visit
Admission: RE | Admit: 2018-06-08 | Discharge: 2018-06-08 | Disposition: A | Discharge status: Home / Self Care | Payer: Medicare Other | Source: Ambulatory Visit | Attending: Radiation Oncology | Admitting: Radiation Oncology

## 2018-06-08 DIAGNOSIS — Z510 Encounter for antineoplastic radiation therapy: Secondary | ICD-10-CM | POA: Diagnosis not present

## 2018-06-08 DIAGNOSIS — C61 Malignant neoplasm of prostate: Secondary | ICD-10-CM | POA: Diagnosis not present

## 2018-06-11 ENCOUNTER — Ambulatory Visit
Admission: RE | Admit: 2018-06-11 | Discharge: 2018-06-11 | Disposition: A | Discharge status: Home / Self Care | Payer: Medicare Other | Source: Ambulatory Visit | Attending: Radiation Oncology | Admitting: Radiation Oncology

## 2018-06-11 DIAGNOSIS — Z510 Encounter for antineoplastic radiation therapy: Secondary | ICD-10-CM | POA: Diagnosis not present

## 2018-06-11 DIAGNOSIS — C61 Malignant neoplasm of prostate: Secondary | ICD-10-CM | POA: Diagnosis not present

## 2018-06-12 ENCOUNTER — Ambulatory Visit
Admission: RE | Admit: 2018-06-12 | Discharge: 2018-06-12 | Disposition: A | Discharge status: Home / Self Care | Payer: Medicare Other | Source: Ambulatory Visit | Attending: Radiation Oncology | Admitting: Radiation Oncology

## 2018-06-12 DIAGNOSIS — C61 Malignant neoplasm of prostate: Secondary | ICD-10-CM | POA: Diagnosis not present

## 2018-06-12 DIAGNOSIS — Z510 Encounter for antineoplastic radiation therapy: Secondary | ICD-10-CM | POA: Diagnosis not present

## 2018-06-14 ENCOUNTER — Ambulatory Visit: Payer: Medicare Other

## 2018-06-14 ENCOUNTER — Ambulatory Visit
Admission: RE | Admit: 2018-06-14 | Discharge: 2018-06-14 | Disposition: A | Discharge status: Home / Self Care | Payer: Medicare Other | Source: Ambulatory Visit | Attending: Radiation Oncology | Admitting: Radiation Oncology

## 2018-06-14 DIAGNOSIS — C61 Malignant neoplasm of prostate: Secondary | ICD-10-CM | POA: Insufficient documentation

## 2018-06-14 DIAGNOSIS — Z510 Encounter for antineoplastic radiation therapy: Secondary | ICD-10-CM | POA: Diagnosis not present

## 2018-06-15 ENCOUNTER — Encounter: Payer: Self-pay | Admitting: Radiation Oncology

## 2018-06-15 ENCOUNTER — Ambulatory Visit: Payer: Medicare Other

## 2018-06-15 ENCOUNTER — Ambulatory Visit
Admission: RE | Admit: 2018-06-15 | Discharge: 2018-06-15 | Disposition: A | Discharge status: Home / Self Care | Payer: Medicare Other | Source: Ambulatory Visit | Attending: Radiation Oncology | Admitting: Radiation Oncology

## 2018-06-15 DIAGNOSIS — C61 Malignant neoplasm of prostate: Secondary | ICD-10-CM | POA: Diagnosis not present

## 2018-06-15 DIAGNOSIS — Z510 Encounter for antineoplastic radiation therapy: Secondary | ICD-10-CM | POA: Diagnosis not present

## 2018-06-26 NOTE — Progress Notes (Signed)
  Radiation Oncology         [phone] ________________________________  Name: Ricardo Reese MRN: [id number]  Date: 06/15/2018  DOB: 12/12/1950  End of Treatment Note  Diagnosis:   68 y.o. male with stage T1cadenocarcinoma of the prostate with a Gleason's score of 4+3and a PSA of 20.3     Indication for treatment:  Curative, Definitive Radiotherapy       Radiation treatment dates:   04/16/2018 - 06/15/2018  Site/dose:  1. The prostate, seminal vesicles, and pelvic lymph nodes were initially treated to 45 Gy in 25 fractions of 1.8 Gy  2. The prostate only was boosted to 75 Gy with 15 additional fractions of 2.0 Gy   Beams/energy:  1. The prostate, seminal vesicles, and pelvic lymph nodes were initially treated using VMAT intensity modulated radiotherapy delivering 6 megavolt photons. Image guidance was performed with CB-CT studies prior to each fraction. He was immobilized with a body fix lower extremity mold.  2. The prostate only was boosted using VMAT intensity modulated radiotherapy delivering 6 megavolt photons. Image guidance was performed with CB-CT studies prior to each fraction. He was immobilized with a body fix lower extremity mold.  Narrative: The patient tolerated radiation treatment relatively well.   He experienced modest fatigue and some minor urinary irritation with nocturia that varied 1-4 times per night and occasional intermittent stream. He denied any significant bowel issues.  Plan: The patient has completed radiation treatment. He will return to radiation oncology clinic for routine followup in one month. I advised him to call or return sooner if he has any questions or concerns related to his recovery or treatment. ________________________________  Sheral Apley. Tammi Klippel, M.D.  This document serves as a record of services personally performed by Tyler Pita, MD. It was created on his behalf by Rae Lips, a trained medical scribe. The creation of this record  is based on the scribe's personal observations and the provider's statements to them. This document has been checked and approved by the attending provider.

## 2018-07-18 ENCOUNTER — Other Ambulatory Visit: Payer: Self-pay

## 2018-07-18 ENCOUNTER — Encounter: Payer: Self-pay | Admitting: Urology

## 2018-07-18 ENCOUNTER — Ambulatory Visit
Admission: RE | Admit: 2018-07-18 | Discharge: 2018-07-18 | Disposition: A | Discharge status: Home / Self Care | Payer: Medicare Other | Source: Ambulatory Visit | Attending: Urology | Admitting: Urology

## 2018-07-18 VITALS — BP 135/75 | HR 66 | Temp 98.0°F | Resp 18 | Wt 218.4 lb

## 2018-07-18 DIAGNOSIS — R5383 Other fatigue: Secondary | ICD-10-CM | POA: Insufficient documentation

## 2018-07-18 DIAGNOSIS — Z79899 Other long term (current) drug therapy: Secondary | ICD-10-CM | POA: Diagnosis not present

## 2018-07-18 DIAGNOSIS — C61 Malignant neoplasm of prostate: Secondary | ICD-10-CM | POA: Diagnosis not present

## 2018-07-18 DIAGNOSIS — R351 Nocturia: Secondary | ICD-10-CM | POA: Insufficient documentation

## 2018-07-18 DIAGNOSIS — Z923 Personal history of irradiation: Secondary | ICD-10-CM | POA: Insufficient documentation

## 2018-07-18 DIAGNOSIS — Z7984 Long term (current) use of oral hypoglycemic drugs: Secondary | ICD-10-CM | POA: Diagnosis not present

## 2018-07-18 NOTE — Progress Notes (Signed)
Pt here today for a 1 month follow-up for prostate cancer. Pt denies having any pain or fatigue. Pt denies having any dysuria or hematuria. Pt denies having any urgency or leakage.   BP 135/75   Pulse 66   Temp 98 F (36.7 C) (Oral)   Resp 18   Wt 218 lb 6.4 oz (99.1 kg)   SpO2 97%   BMI 30.46 kg/m    Wt Readings from Last 3 Encounters:  07/18/18 218 lb 6.4 oz (99.1 kg)  05/04/18 209 lb (94.8 kg)  01/02/18 222 lb 12.8 oz (101.1 kg)

## 2018-07-18 NOTE — Progress Notes (Signed)
Radiation Oncology         [phone]) [phone] ________________________________  Name: Ricardo Reese MRN: [id number]  Date: 07/18/2018  DOB: [birth year]  Post Treatment Note  CC: Venia Carbon, MD  Raynelle Bring, MD  Diagnosis:   68 y.o. 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 The prostate, seminal vesicles, and pelvic lymph nodes were initially treated to 45 Gy in 25 fractions of 1.8 Gy  2. The prostate only was boosted to 75 Gy with 15 additional fractions of 2.0 Gy   Narrative:  The patient returns today for routine follow-up.  He tolerated radiation treatment relatively well with modest fatigue and some minor urinary irritation with nocturia that varied 1-4 times per night and occasional intermittent stream. He denied any significant bowel issues.   He was maintained on ADT throughout his radiation treatments which he continued to tolerate well.                           On review of systems, the patient states that he is doing extremely well.  At this point, his LUTS have almost completely resolved and are back to baseline.  He specifically denies dysuria, gross hematuria, excessive daytime frequency, urgency, incomplete bladder emptying or incontinence.  His current IPSS score is 1.  He continues with modest fatigue but reports that this has significantly improved over the past 2 weeks and is tolerable.  He denies any bothersome hot flashes associated with his ADT.  Overall, he is quite pleased with his progress to date.  ALLERGIES:  is allergic to no known allergies.  Meds: Current Outpatient Medications  Medication Sig Dispense Refill  . amLODipine (NORVASC) 5 MG tablet Take 1 tablet (5 mg total) by mouth daily. 90 tablet 3  . B Complex-C (SUPER B COMPLEX PO) Take 1 tablet by mouth daily.     . fish oil-omega-3 fatty acids 1000 MG capsule Take 1 g by  mouth daily.    Marland Kitchen glucose blood (CONTOUR NEXT TEST) test strip Use to obtain blood sugar once a day. Dx Code E11.9 100 each 3  . losartan-hydrochlorothiazide (HYZAAR) 100-25 MG tablet Take 1 tablet by mouth daily. 90 tablet 3  . metFORMIN (GLUCOPHAGE) 500 MG tablet Take 1 tablet (500 mg total) by mouth 2 (two) times daily with a meal. 180 tablet 3  . pravastatin (PRAVACHOL) 40 MG tablet Take 1 tablet (40 mg total) by mouth daily. 90 tablet 3  . sildenafil (REVATIO) 20 MG tablet Take 3-5 tablets (60-100 mg total) by mouth daily as needed. 50 tablet 11   No current facility-administered medications for this encounter.     Physical Findings:  weight is 218 lb 6.4 oz (99.1 kg). His oral temperature is 98 F (36.7 C). His blood pressure is 135/75 and his pulse is 66. His respiration is 18 and oxygen saturation is 97%.  Pain Assessment Pain Score: 0-No pain/10 In general this is a well appearing Caucasian male in no acute distress.  He's alert and oriented x4 and appropriate throughout the examination. Cardiopulmonary assessment is negative for acute distress and he exhibits normal effort.   Lab Findings: Lab Results  Component Value Date   WBC 6.4 10/04/2017   HGB 14.9 10/04/2017   HCT 43.4 10/04/2017   MCV 86.6 10/04/2017  PLT 264.0 10/04/2017     Radiographic Findings: No results found.  Impression/Plan: 1. 68 y.o. 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   He will continue to follow up with urology for ongoing PSA determinations and has an appointment scheduled with Dr. Junious Silk on 08/06/2018.  He has continued to tolerate his ADT therapy well and intends to complete a full 2-year course of this therapy under the care and direction of Dr. Junious Silk.  He understands what to expect with regards to PSA monitoring going forward. I will look forward to following his response to treatment via correspondence with urology, and would be  happy to continue to participate in his care if clinically indicated. I talked to the patient about what to expect in the future, including his risk for erectile dysfunction and rectal bleeding. I encouraged him to call or return to the office if he has any questions regarding his previous radiation or possible radiation side effects. He was comfortable with this plan and will follow up as needed.    Nicholos Johns, PA-C

## 2018-07-20 DIAGNOSIS — C61 Malignant neoplasm of prostate: Secondary | ICD-10-CM | POA: Diagnosis not present

## 2018-07-25 ENCOUNTER — Ambulatory Visit: Payer: Self-pay | Admitting: Urology

## 2018-08-06 DIAGNOSIS — C61 Malignant neoplasm of prostate: Secondary | ICD-10-CM | POA: Diagnosis not present

## 2018-08-28 ENCOUNTER — Encounter: Payer: Self-pay | Admitting: Internal Medicine

## 2018-08-28 ENCOUNTER — Ambulatory Visit (INDEPENDENT_AMBULATORY_CARE_PROVIDER_SITE_OTHER): Payer: Medicare Other | Admitting: Internal Medicine

## 2018-08-28 ENCOUNTER — Other Ambulatory Visit: Payer: Self-pay

## 2018-08-28 ENCOUNTER — Telehealth: Payer: Self-pay | Admitting: *Deleted

## 2018-08-28 VITALS — BP 130/70 | HR 75 | Temp 100.6°F | Ht 71.0 in | Wt 218.0 lb

## 2018-08-28 DIAGNOSIS — K529 Noninfective gastroenteritis and colitis, unspecified: Secondary | ICD-10-CM | POA: Diagnosis not present

## 2018-08-28 DIAGNOSIS — R509 Fever, unspecified: Secondary | ICD-10-CM

## 2018-08-28 LAB — POC INFLUENZA A&B (BINAX/QUICKVUE)
INFLUENZA A, POC: NEGATIVE
INFLUENZA B, POC: NEGATIVE

## 2018-08-28 NOTE — Telephone Encounter (Signed)
Received call from pt's wife. She had questions about what to give patient to drink that wouldn't affect his blood sugar. He was having n/v, fever and chills. She reports he also vomited "coffee grinds" x 1. Pt did go to MD office this morning and was negative for the flu and was thought to have "stomach bug". He hasn't been checking his blood sugars. A1C was good 3 months ago - 6.0%. Instructed her to go ahead and give him fluids with sugar (Gatorade) until he is able to keep food down and prevent dehydration. Also she will attempt to have him check his blood sugars. Instructed her to call for any further questions.

## 2018-08-28 NOTE — Assessment & Plan Note (Addendum)
Brief vomiting spell Low grade fever Doesn't sound like he had hematemesis Feels better now Slowly reintroduce food Flu test done just in case ----negative

## 2018-08-28 NOTE — Progress Notes (Signed)
Subjective:    Patient ID: Ricardo Reese, male    DOB: 1950-08-02, 68 y.o.   MRN: [id number]  HPI Here due to illness since yesterday afternoon Outside doing work---came in Mouth felt dry--then nausea and vomited once No blood---but there was black mixed in it Has kept up with fluids--but hasn't eaten yet  Had chills but no clear fever No cough, rhinorrhea, sore throat  No diarrhea BM last night---was normal No ill exposures No obvious contaminated food No recent travel  Current Outpatient Medications on File Prior to Visit  Medication Sig Dispense Refill  . amLODipine (NORVASC) 5 MG tablet Take 1 tablet (5 mg total) by mouth daily. 90 tablet 3  . B Complex-C (SUPER B COMPLEX PO) Take 1 tablet by mouth daily.     . fish oil-omega-3 fatty acids 1000 MG capsule Take 1 g by mouth daily.    Marland Kitchen glucose blood (CONTOUR NEXT TEST) test strip Use to obtain blood sugar once a day. Dx Code E11.9 100 each 3  . losartan-hydrochlorothiazide (HYZAAR) 100-25 MG tablet Take 1 tablet by mouth daily. 90 tablet 3  . metFORMIN (GLUCOPHAGE) 500 MG tablet Take 1 tablet (500 mg total) by mouth 2 (two) times daily with a meal. 180 tablet 3  . pravastatin (PRAVACHOL) 40 MG tablet Take 1 tablet (40 mg total) by mouth daily. 90 tablet 3  . sildenafil (REVATIO) 20 MG tablet Take 3-5 tablets (60-100 mg total) by mouth daily as needed. 50 tablet 11   No current facility-administered medications on file prior to visit.     Allergies  Allergen Reactions  . No Known Allergies     Past Medical History:  Diagnosis Date  . Diabetes mellitus without complication ([place])   . History of kidney stones    has had twice  . Hyperlipidemia   . Hypertension   . Impaired fasting glucose   . Kidney stone 1983  . Melanoma of back [facility]) 2013   Dr Evorn Gong  . Pneumonia    walking back in 2002  . Prostate cancer ([place])   . 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 KNEE SURGERY  1969   cartilage removal right knee   . melanoma back  09/2010  . PROSTATE BIOPSY    . 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   Procedure: LEFT TOTAL HIP ARTHROPLASTY ANTERIOR APPROACH;  Surgeon: Mcarthur Rossetti, MD;  Location: [place];  Service: Orthopedics;  Laterality: Left;    Family History  Problem Relation Age of Onset  . Stroke Father   . Lung cancer Father   . Prostate cancer Brother   . Heart disease Brother   . Diabetes Neg Hx   . Colon cancer Neg Hx   . Stomach cancer Neg Hx   . Esophageal cancer Neg Hx   . Rectal cancer Neg Hx     Social History   Socioeconomic History  . Marital status: Married    Spouse name: Ricardo Reese  . Number of children: 2  . Years of education: Not on file  . Highest education level: Not on file  Occupational History  . 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 Financial resource strain: Not on file  . Food insecurity:    Worry: Not on file    Inability:  Not on file  . Transportation needs:    Medical: No    Non-medical: No  Tobacco Use  . Smoking status: Never Smoker  . Smokeless tobacco: Never Used  Substance and Sexual Activity  . Alcohol use: Yes    Alcohol/week: 3.0 - 4.0 standard drinks    Types: 3 - 4 Cans of beer per week    Comment: occasional beer  . Drug use: No  . Sexual activity: Yes  Lifestyle  . Physical activity:    Days per week: Not on file    Minutes per session: Not on file  . Stress: Not on file  Relationships  . Social connections:    Talks on phone: Not on file    Gets together: Not on file    Attends religious service: Not on file    Active member of club or organization: Not on file    Attends meetings of clubs or organizations: Not on file    Relationship status: Not on file  . Intimate partner violence:    Fear of current or ex partner: Not on file    Emotionally  abused: Not on file    Physically abused: Not on file    Forced sexual activity: Not on file  Other Topics Concern  . Not on file  Social History Narrative   Has living will   Wife is health care POA--- daughters next   Would accept resuscitation   No tube feeds if cognitively unaware   Review of Systems  No rash No SOB     Objective:   Physical Exam  HENT:  Mouth/Throat: Oropharynx is clear and moist. No oropharyngeal exudate.  Neck: No thyromegaly present.  Respiratory: Effort normal and breath sounds normal. No respiratory distress. He has no wheezes. He has no rales.  GI: Soft. Bowel sounds are normal. He exhibits no distension. There is no abdominal tenderness. There is no rebound and no guarding.  Lymphadenopathy:    He has no cervical adenopathy.           Assessment & Plan:

## 2018-09-26 ENCOUNTER — Other Ambulatory Visit: Payer: Self-pay | Admitting: Internal Medicine

## 2018-10-11 ENCOUNTER — Other Ambulatory Visit: Payer: Self-pay | Admitting: Internal Medicine

## 2018-10-12 ENCOUNTER — Other Ambulatory Visit: Payer: Self-pay | Admitting: Internal Medicine

## 2018-10-24 ENCOUNTER — Encounter: Payer: Self-pay | Admitting: Internal Medicine

## 2018-10-24 ENCOUNTER — Ambulatory Visit (INDEPENDENT_AMBULATORY_CARE_PROVIDER_SITE_OTHER): Payer: Medicare Other | Admitting: Internal Medicine

## 2018-10-24 VITALS — Temp 98.3°F | Ht 71.0 in | Wt 218.0 lb

## 2018-10-24 DIAGNOSIS — Z7189 Other specified counseling: Secondary | ICD-10-CM

## 2018-10-24 DIAGNOSIS — I10 Essential (primary) hypertension: Secondary | ICD-10-CM

## 2018-10-24 DIAGNOSIS — E119 Type 2 diabetes mellitus without complications: Secondary | ICD-10-CM

## 2018-10-24 DIAGNOSIS — C61 Malignant neoplasm of prostate: Secondary | ICD-10-CM

## 2018-10-24 DIAGNOSIS — Z0000 Encounter for general adult medical examination without abnormal findings: Secondary | ICD-10-CM

## 2018-10-24 MED ORDER — AMLODIPINE BESYLATE 5 MG PO TABS: 5.0000 mg | ORAL_TABLET | Freq: Every day | ORAL | 3 refills | Status: DC

## 2018-10-24 MED ORDER — LOSARTAN POTASSIUM-HCTZ 100-25 MG PO TABS: 1.0000 | ORAL_TABLET | Freq: Every day | ORAL | 3 refills | Status: DC

## 2018-10-24 MED ORDER — PRAVASTATIN SODIUM 40 MG PO TABS: 40.0000 mg | ORAL_TABLET | Freq: Every day | ORAL | 3 refills | Status: DC

## 2018-10-24 MED ORDER — SILDENAFIL CITRATE 20 MG PO TABS: 60.0000 mg | ORAL_TABLET | Freq: Every day | ORAL | 11 refills | Status: DC | PRN

## 2018-10-24 MED ORDER — TETANUS-DIPHTHERIA TOXOIDS TD 5-2 LFU IM INJ: 0.5000 mL | INJECTION | Freq: Once | INTRAMUSCULAR | 0 refills | Status: AC

## 2018-10-24 MED ORDER — METFORMIN HCL 500 MG PO TABS: 500.0000 mg | ORAL_TABLET | Freq: Two times a day (BID) | ORAL | 3 refills | Status: DC

## 2018-10-24 NOTE — Progress Notes (Signed)
Subjective:    Patient ID: Ricardo Reese, male    DOB: 1950/11/23, 68 y.o.   MRN: [id number]  HPI Virtual visit for Medicare wellness visit and follow up of chronic health conditions Identification done Reviewed billing and he gave consent He is in his home, and I am in my office  He is doing okay Feels good now---still tired some He has gained a couple of pounds---trying to follow diabetic diet, etc Playing golf, doing yard work, Social research officer, government  Done with radiation for several months Satisfied with this Voids okay No incontinence Same ED---sildenafil still effective PSA done 1 month after RT---0.1 then  Rarely checks BP No headaches No chest pain or SOB No change in exercise tolerance No dizziness or syncope No palpitations  No edema  Checks sugars 2-3 times a week Generally 97-104 depending on what he eats (somewhat higher at times) Had eye appt scheduled at [facility]---canceled. Will reschedule No foot numbness, pain or tingling  No hospitalizations or surgery Other doctors-- Dr Lenis Noon, Dr Antony Odea, Dr Eskridge--urology, Dr Tammi Klippel --oncology No tobacco products 2-3 beers per week Some exercise regularly Vision is okay Hearing is fine No falls No depression or anhedonia Independent with instrumental ADLs No memory problems  Current Outpatient Medications on File Prior to Visit  Medication Sig Dispense Refill  . amLODipine (NORVASC) 5 MG tablet TAKE 1 TABLET BY MOUTH EVERY DAY 90 tablet 0  . B Complex-C (SUPER B COMPLEX PO) Take 1 tablet by mouth daily.     . fish oil-omega-3 fatty acids 1000 MG capsule Take 1 g by mouth daily.    Marland Kitchen glucose blood (CONTOUR NEXT TEST) test strip Use to obtain blood sugar once a day. Dx Code E11.9 100 each 3  . losartan-hydrochlorothiazide (HYZAAR) 100-25 MG tablet TAKE 1 TABLET BY MOUTH EVERY DAY 90 tablet 0  . metFORMIN (GLUCOPHAGE) 500 MG tablet TAKE 1 TABLET (500 MG TOTAL) BY MOUTH 2 (TWO) TIMES DAILY WITH A  MEAL. 180 tablet 3  . pravastatin (PRAVACHOL) 40 MG tablet TAKE 1 TABLET BY MOUTH EVERY DAY 90 tablet 0  . sildenafil (REVATIO) 20 MG tablet Take 3-5 tablets (60-100 mg total) by mouth daily as needed. 50 tablet 11   No current facility-administered medications on file prior to visit.     Allergies  Allergen Reactions  . No Known Allergies     Past Medical History:  Diagnosis Date  . Diabetes mellitus without complication ([place])   . History of kidney stones    has had twice  . Hyperlipidemia   . Hypertension   . Impaired fasting glucose   . Kidney stone 1983  . Melanoma of back [facility]) 2013   Dr Evorn Gong  . Pneumonia    walking back in 2002  . Prostate cancer ([place])   . 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 KNEE SURGERY  1969   cartilage removal right knee   . melanoma back  09/2010  . PROSTATE BIOPSY    . 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   Procedure: LEFT TOTAL HIP ARTHROPLASTY ANTERIOR APPROACH;  Surgeon: Mcarthur Rossetti, MD;  Location: [facility];  Service: Orthopedics;  Laterality: Left;    Family History  Problem Relation Age of Onset  . Stroke Father   . Lung cancer Father   . Prostate cancer Brother   . Heart  disease Brother   . Diabetes Neg Hx   . Colon cancer Neg Hx   . Stomach cancer Neg Hx   . Esophageal cancer Neg Hx   . Rectal cancer Neg Hx     Social History   Socioeconomic History  . Marital status: Married    Spouse name: [name]  . Number of children: 2  . Years of education: Not on file  . Highest education level: Not on file  Occupational History  . 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 Financial resource strain: Not on file  . Food insecurity:    Worry: Not on file    Inability: Not on file  . Transportation needs:    Medical: No    Non-medical: No  Tobacco Use  .  Smoking status: Never Smoker  . Smokeless tobacco: Never Used  Substance and Sexual Activity  . Alcohol use: Yes    Alcohol/week: 3.0 - 4.0 standard drinks    Types: 3 - 4 Cans of beer per week    Comment: occasional beer  . Drug use: No  . Sexual activity: Yes  Lifestyle  . Physical activity:    Days per week: Not on file    Minutes per session: Not on file  . Stress: Not on file  Relationships  . Social connections:    Talks on phone: Not on file    Gets together: Not on file    Attends religious service: Not on file    Active member of club or organization: Not on file    Attends meetings of clubs or organizations: Not on file    Relationship status: Not on file  . Intimate partner violence:    Fear of current or ex partner: Not on file    Emotionally abused: Not on file    Physically abused: Not on file    Forced sexual activity: Not on file  Other Topics Concern  . Not on file  Social History Narrative   Has living will   Wife is health care POA--- daughters next   Would accept resuscitation   No tube feeds if cognitively unaware   Review of Systems Appetite is good Sleeps somewhat better--- 6-7 hours per night Wears seat belt Bowels are fine. No blood No skin lesions or rash No heartburn or dysphagia No sig back pain or joint pain---just knee stiffness from old overuse    Objective:   Physical Exam  Constitutional: He is oriented to person, place, and time. He appears well-developed. No distress.  Respiratory: Effort normal. No respiratory distress.  Neurological: He is alert and oriented to person, place, and time.  President--- "Gerarda Fraction Trump, Barack Obama, Bush" [phone] [id number] Recall 3/3  Skin:  No foot lesions  Psychiatric: He has a normal mood and affect. His behavior is normal.           Assessment & Plan:

## 2018-10-24 NOTE — Addendum Note (Signed)
Addended by: Ellamae Sia on: 10/24/2018 04:02 PM   Modules accepted: Orders

## 2018-10-24 NOTE — Assessment & Plan Note (Signed)
Still seems to have excellent control On statin No complications ----will reschedule his eye exam

## 2018-10-24 NOTE — Patient Instructions (Signed)
Please get your tetanus booster at the pharmacy.

## 2018-10-24 NOTE — Assessment & Plan Note (Signed)
I have personally reviewed the Medicare Annual Wellness questionnaire and have noted 1. The patient's medical and social history 2. Their use of alcohol, tobacco or illicit drugs 3. Their current medications and supplements 4. The patient's functional ability including ADL's, fall risks, home safety risks and hearing or visual             impairment. 5. Diet and physical activities 6. Evidence for depression or mood disorders  The patients weight, height, BMI and visual acuity have been recorded in the chart I have made referrals, counseling and provided education to the patient based review of the above and I have provided the pt with a written personalized care plan for preventive services.  I have provided you with a copy of your personalized plan for preventive services. Please take the time to review along with your updated medication list.  Colon due 2023 Trying to exercise regularly Flu vaccine this fall Will give pneumovax Rx for Td sent to pharmacy

## 2018-10-24 NOTE — Assessment & Plan Note (Signed)
See social history 

## 2018-10-24 NOTE — Assessment & Plan Note (Signed)
Done with his RT Not being seen again till August----will recheck his PSA now

## 2018-10-24 NOTE — Progress Notes (Signed)
Hearing Screening Comments: No hearing aids and no testing in the last 12 months. Vision Screening Comments: Appt cancelled due to Covid-19

## 2018-10-24 NOTE — Assessment & Plan Note (Signed)
BP Readings from Last 3 Encounters:  08/28/18 130/70  07/18/18 135/75  05/04/18 134/78   Asked him to monitor intermittently

## 2018-10-29 ENCOUNTER — Other Ambulatory Visit (INDEPENDENT_AMBULATORY_CARE_PROVIDER_SITE_OTHER): Payer: Medicare Other

## 2018-10-29 ENCOUNTER — Ambulatory Visit (INDEPENDENT_AMBULATORY_CARE_PROVIDER_SITE_OTHER): Payer: Medicare Other

## 2018-10-29 ENCOUNTER — Other Ambulatory Visit: Payer: Self-pay

## 2018-10-29 DIAGNOSIS — I10 Essential (primary) hypertension: Secondary | ICD-10-CM | POA: Diagnosis not present

## 2018-10-29 DIAGNOSIS — Z0000 Encounter for general adult medical examination without abnormal findings: Secondary | ICD-10-CM

## 2018-10-29 DIAGNOSIS — Z7189 Other specified counseling: Secondary | ICD-10-CM

## 2018-10-29 DIAGNOSIS — E119 Type 2 diabetes mellitus without complications: Secondary | ICD-10-CM | POA: Diagnosis not present

## 2018-10-29 DIAGNOSIS — C61 Malignant neoplasm of prostate: Secondary | ICD-10-CM | POA: Diagnosis not present

## 2018-10-29 DIAGNOSIS — Z23 Encounter for immunization: Secondary | ICD-10-CM | POA: Diagnosis not present

## 2018-10-29 LAB — CBC
HCT: 37.5 % — ABNORMAL LOW (ref 39.0–52.0)
Hemoglobin: 13 g/dL (ref 13.0–17.0)
MCHC: 34.6 g/dL (ref 30.0–36.0)
MCV: 87.2 fl (ref 78.0–100.0)
Platelets: 240 10*3/uL (ref 150.0–400.0)
RBC: 4.3 Mil/uL (ref 4.22–5.81)
RDW: 14.2 % (ref 11.5–15.5)
WBC: 4.3 10*3/uL (ref 4.0–10.5)

## 2018-10-29 LAB — PSA: PSA: 0.14 ng/mL (ref 0.10–4.00)

## 2018-10-29 LAB — LIPID PANEL
Cholesterol: 157 mg/dL (ref 0–200)
HDL: 55.1 mg/dL (ref >39.00)
LDL Cholesterol: 82 mg/dL (ref 0–99)
NonHDL: 101.61
Total CHOL/HDL Ratio: 3
Triglycerides: 97 mg/dL (ref 0.0–149.0)
VLDL: 19.4 mg/dL (ref 0.0–40.0)

## 2018-10-29 LAB — COMPREHENSIVE METABOLIC PANEL
ALT: 18 U/L (ref 0–53)
AST: 17 U/L (ref 0–37)
Albumin: 4.5 g/dL (ref 3.5–5.2)
Alkaline Phosphatase: 56 U/L (ref 39–117)
BUN: 20 mg/dL (ref 6–23)
CO2: 29 mEq/L (ref 19–32)
Calcium: 9.6 mg/dL (ref 8.4–10.5)
Chloride: 103 mEq/L (ref 96–112)
Creatinine, Ser: 0.77 mg/dL (ref 0.40–1.50)
GFR: 100.66 mL/min (ref >60.00)
Glucose, Bld: 112 mg/dL — ABNORMAL HIGH (ref 70–99)
Potassium: 4.3 mEq/L (ref 3.5–5.1)
Sodium: 140 mEq/L (ref 135–145)
Total Bilirubin: 0.6 mg/dL (ref 0.2–1.2)
Total Protein: 6.8 g/dL (ref 6.0–8.3)

## 2018-10-29 LAB — HEMOGLOBIN A1C: Hgb A1c MFr Bld: 6.4 % (ref 4.6–6.5)

## 2019-01-11 DIAGNOSIS — X32XXXA Exposure to sunlight, initial encounter: Secondary | ICD-10-CM | POA: Diagnosis not present

## 2019-01-11 DIAGNOSIS — D2262 Melanocytic nevi of left upper limb, including shoulder: Secondary | ICD-10-CM | POA: Diagnosis not present

## 2019-01-11 DIAGNOSIS — D2272 Melanocytic nevi of left lower limb, including hip: Secondary | ICD-10-CM | POA: Diagnosis not present

## 2019-01-11 DIAGNOSIS — B354 Tinea corporis: Secondary | ICD-10-CM | POA: Diagnosis not present

## 2019-01-11 DIAGNOSIS — D2261 Melanocytic nevi of right upper limb, including shoulder: Secondary | ICD-10-CM | POA: Diagnosis not present

## 2019-01-11 DIAGNOSIS — L570 Actinic keratosis: Secondary | ICD-10-CM | POA: Diagnosis not present

## 2019-01-11 DIAGNOSIS — Z85820 Personal history of malignant melanoma of skin: Secondary | ICD-10-CM | POA: Diagnosis not present

## 2019-01-11 DIAGNOSIS — Z08 Encounter for follow-up examination after completed treatment for malignant neoplasm: Secondary | ICD-10-CM | POA: Diagnosis not present

## 2019-01-17 DIAGNOSIS — C61 Malignant neoplasm of prostate: Secondary | ICD-10-CM | POA: Diagnosis not present

## 2019-02-01 DIAGNOSIS — C61 Malignant neoplasm of prostate: Secondary | ICD-10-CM | POA: Diagnosis not present

## 2019-03-26 ENCOUNTER — Ambulatory Visit: Payer: Medicare Other

## 2019-04-03 DIAGNOSIS — Z23 Encounter for immunization: Secondary | ICD-10-CM | POA: Diagnosis not present

## 2019-04-26 ENCOUNTER — Ambulatory Visit: Payer: Medicare Other | Admitting: Internal Medicine

## 2019-04-26 ENCOUNTER — Other Ambulatory Visit: Payer: Self-pay

## 2019-04-26 ENCOUNTER — Ambulatory Visit (INDEPENDENT_AMBULATORY_CARE_PROVIDER_SITE_OTHER): Payer: Medicare Other | Admitting: Internal Medicine

## 2019-04-26 ENCOUNTER — Encounter: Payer: Self-pay | Admitting: Internal Medicine

## 2019-04-26 VITALS — BP 110/64 | HR 45 | Temp 98.4°F | Ht 71.0 in | Wt 202.0 lb

## 2019-04-26 DIAGNOSIS — E1159 Type 2 diabetes mellitus with other circulatory complications: Secondary | ICD-10-CM

## 2019-04-26 DIAGNOSIS — E119 Type 2 diabetes mellitus without complications: Secondary | ICD-10-CM | POA: Diagnosis not present

## 2019-04-26 DIAGNOSIS — I10 Essential (primary) hypertension: Secondary | ICD-10-CM | POA: Diagnosis not present

## 2019-04-26 LAB — POCT GLYCOSYLATED HEMOGLOBIN (HGB A1C): Hemoglobin A1C: 6.1 % — AB (ref 4.0–5.6)

## 2019-04-26 NOTE — Assessment & Plan Note (Signed)
BP Readings from Last 3 Encounters:  04/26/19 110/64  08/28/18 130/70  07/18/18 135/75   Doing fine

## 2019-04-26 NOTE — Progress Notes (Signed)
Subjective:    Patient ID: Ricardo Reese, male    DOB: 09/24/1950, 68 y.o.   MRN: [id number]  HPI Here for follow up of diabetes  Has been eating better Weight is down--- 7# from last year Exercising also Checks sugars once a week--- 95-105 No hypoglycemic spells  No chest pain No SOB No dizziness or syncope  Current Outpatient Medications on File Prior to Visit  Medication Sig Dispense Refill  . amLODipine (NORVASC) 5 MG tablet Take 1 tablet (5 mg total) by mouth daily. 90 tablet 3  . amoxicillin (AMOXIL) 500 MG tablet Take 1,000 mg by mouth 2 (two) times daily.    . B Complex-C (SUPER B COMPLEX PO) Take 1 tablet by mouth daily.     . fish oil-omega-3 fatty acids 1000 MG capsule Take 1 g by mouth daily.    Marland Kitchen glucose blood (CONTOUR NEXT TEST) test strip Use to obtain blood sugar once a day. Dx Code E11.9 100 each 3  . losartan-hydrochlorothiazide (HYZAAR) 100-25 MG tablet Take 1 tablet by mouth daily. 90 tablet 3  . metFORMIN (GLUCOPHAGE) 500 MG tablet Take 1 tablet (500 mg total) by mouth 2 (two) times daily with a meal. 180 tablet 3  . pravastatin (PRAVACHOL) 40 MG tablet Take 1 tablet (40 mg total) by mouth daily. 90 tablet 3  . sildenafil (REVATIO) 20 MG tablet Take 3-5 tablets (60-100 mg total) by mouth daily as needed. 50 tablet 11   No current facility-administered medications on file prior to visit.     Allergies  Allergen Reactions  . No Known Allergies     Past Medical History:  Diagnosis Date  . Diabetes mellitus without complication ([place])   . History of kidney stones    has had twice  . Hyperlipidemia   . Hypertension   . Impaired fasting glucose   . Kidney stone 1983  . Melanoma of back [facility]) 2013   Dr Evorn Gong  . Pneumonia    walking back in 2002  . Prostate cancer ([place])   . 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 KNEE SURGERY  1969   cartilage removal right knee   .  melanoma back  09/2010  . PROSTATE BIOPSY    . 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   Procedure: LEFT TOTAL HIP ARTHROPLASTY ANTERIOR APPROACH;  Surgeon: Mcarthur Rossetti, MD;  Location: [facility];  Service: Orthopedics;  Laterality: Left;    Family History  Problem Relation Age of Onset  . Stroke Father   . Lung cancer Father   . Prostate cancer Brother   . Heart disease Brother   . Diabetes Neg Hx   . Colon cancer Neg Hx   . Stomach cancer Neg Hx   . Esophageal cancer Neg Hx   . Rectal cancer Neg Hx     Social History   Socioeconomic History  . Marital status: Married    Spouse name: [name]  . Number of children: 2  . Years of education: Not on file  . Highest education level: Not on file  Occupational History  . 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 Financial resource strain: Not on file  . Food insecurity    Worry: Not on file    Inability: Not on file  . Transportation  needs    Medical: No    Non-medical: No  Tobacco Use  . Smoking status: Never Smoker  . Smokeless tobacco: Never Used  Substance and Sexual Activity  . Alcohol use: Yes    Alcohol/week: 3.0 - 4.0 standard drinks    Types: 3 - 4 Cans of beer per week    Comment: occasional beer  . Drug use: No  . Sexual activity: Yes  Lifestyle  . Physical activity    Days per week: Not on file    Minutes per session: Not on file  . Stress: Not on file  Relationships  . Social Herbalist on phone: Not on file    Gets together: Not on file    Attends religious service: Not on file    Active member of club or organization: Not on file    Attends meetings of clubs or organizations: Not on file    Relationship status: Not on file  . Intimate partner violence    Fear of current or ex partner: Not on file    Emotionally abused: Not on file    Physically abused: Not on file    Forced sexual activity: Not on  file  Other Topics Concern  . Not on file  Social History Narrative   Has living will   Wife is health care POA--- daughters next   Would accept resuscitation   No tube feeds if cognitively unaware   Review of Systems Sleeping better now Appetite is fine No foot numbness, tingling or pain Infected left lower tooth extracted yesterday    Objective:   Physical Exam  Constitutional: He appears well-developed. No distress.  Neck: No thyromegaly present.  Cardiovascular: Normal rate, regular rhythm, normal heart sounds and intact distal pulses. Exam reveals no gallop.  No murmur heard. Respiratory: Effort normal and breath sounds normal. No respiratory distress. He has no wheezes. He has no rales.  Musculoskeletal:        General: No edema.  Lymphadenopathy:    He has no cervical adenopathy.  Skin:  No foot lesions  Psychiatric: He has a normal mood and affect. His behavior is normal.           Assessment & Plan:

## 2019-04-26 NOTE — Assessment & Plan Note (Signed)
Lab Results  Component Value Date   HGBA1C 6.1 (A) 04/26/2019   Excellent control HTN as well---this is also controlled

## 2019-05-03 ENCOUNTER — Other Ambulatory Visit: Payer: Self-pay

## 2019-05-03 MED ORDER — SILDENAFIL CITRATE 20 MG PO TABS: 60.0000 mg | ORAL_TABLET | Freq: Every day | ORAL | 11 refills | Status: DC | PRN

## 2019-05-03 NOTE — Telephone Encounter (Signed)
Patient called stating that he is not using [facility] anymore for Sildenafil and will be using [facility] in [place]. He is requesting refills to be sent there. [facility] pharmacy does not have anymore refills on file for the patient to be able to transfer it. I updated his pharmacy in the chart. Patient is still using [facility] for all of his other RXs. Please review. Thank you

## 2019-06-20 ENCOUNTER — Other Ambulatory Visit: Payer: Self-pay

## 2019-06-20 ENCOUNTER — Encounter: Payer: Self-pay | Admitting: Internal Medicine

## 2019-06-20 ENCOUNTER — Ambulatory Visit (INDEPENDENT_AMBULATORY_CARE_PROVIDER_SITE_OTHER): Payer: Medicare Other | Admitting: Internal Medicine

## 2019-06-20 DIAGNOSIS — B070 Plantar wart: Secondary | ICD-10-CM | POA: Diagnosis not present

## 2019-06-20 DIAGNOSIS — M79672 Pain in left foot: Secondary | ICD-10-CM | POA: Diagnosis not present

## 2019-06-20 NOTE — Progress Notes (Signed)
Subjective:    Patient ID: Ricardo Reese, male    DOB: 12-14-1950, 69 y.o.   MRN: [id number]  HPI Here due to a skin lesion on his right foot  This visit occurred during the SARS-CoV-2 public health emergency.  Safety protocols were in place, including screening questions prior to the visit, additional usage of staff PPE, and extensive cleaning of exam room while observing appropriate contact time as indicated for disinfecting solutions.   Has a spot on his plantar right foot Thinks it may be a wart Noticed it over the past 2 weeks Will hurt when he walks in bare feet  Current Outpatient Medications on File Prior to Visit  Medication Sig Dispense Refill  . amLODipine (NORVASC) 5 MG tablet Take 1 tablet (5 mg total) by mouth daily. 90 tablet 3  . B Complex-C (SUPER B COMPLEX PO) Take 1 tablet by mouth daily.     . fish oil-omega-3 fatty acids 1000 MG capsule Take 1 g by mouth daily.    Marland Kitchen glucose blood (CONTOUR NEXT TEST) test strip Use to obtain blood sugar once a day. Dx Code E11.9 100 each 3  . losartan-hydrochlorothiazide (HYZAAR) 100-25 MG tablet Take 1 tablet by mouth daily. 90 tablet 3  . metFORMIN (GLUCOPHAGE) 500 MG tablet Take 1 tablet (500 mg total) by mouth 2 (two) times daily with a meal. 180 tablet 3  . pravastatin (PRAVACHOL) 40 MG tablet Take 1 tablet (40 mg total) by mouth daily. 90 tablet 3  . sildenafil (REVATIO) 20 MG tablet Take 3-5 tablets (60-100 mg total) by mouth daily as needed. 50 tablet 11   No current facility-administered medications on file prior to visit.    Allergies  Allergen Reactions  . No Known Allergies     Past Medical History:  Diagnosis Date  . Diabetes mellitus without complication ([place])   . History of kidney stones    has had twice  . Hyperlipidemia   . Hypertension   . Impaired fasting glucose   . Kidney stone 1983  . Melanoma of back [facility]) 2013   Dr Evorn Gong  . Pneumonia    walking back in 2002  . Prostate cancer ([place])   . 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 KNEE SURGERY  1969   cartilage removal right knee   . melanoma back  09/2010  . PROSTATE BIOPSY    . 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   Procedure: LEFT TOTAL HIP ARTHROPLASTY ANTERIOR APPROACH;  Surgeon: Mcarthur Rossetti, MD;  Location: [facility];  Service: Orthopedics;  Laterality: Left;    Family History  Problem Relation Age of Onset  . Stroke Father   . Lung cancer Father   . Prostate cancer Brother   . Heart disease Brother   . Diabetes Neg Hx   . Colon cancer Neg Hx   . Stomach cancer Neg Hx   . Esophageal cancer Neg Hx   . Rectal cancer Neg Hx     Social History   Socioeconomic History  . Marital status: Married    Spouse name: [name]  . Number of children: 2  . Years of education: Not on file  . Highest education level: Not on file  Occupational History  . 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  Smoking status: Never Smoker  . Smokeless tobacco: Never Used  Substance and Sexual Activity  . Alcohol use: Yes    Alcohol/week: 3.0 - 4.0 standard drinks    Types: 3 - 4 Cans of beer per week    Comment: occasional beer  . Drug use: No  . Sexual activity: Yes  Other Topics Concern  . Not on file  Social History Narrative   Has living will   Wife is health care POA--- daughters next   Would accept resuscitation   No tube feeds if cognitively unaware   Social Determinants of Health   Financial Resource Strain:   . Difficulty of Paying Living Expenses: Not on file  Food Insecurity:   . Worried About Charity fundraiser in the Last Year: Not on file  . Ran Out of Food in the Last Year: Not on file  Transportation Needs: No Transportation Needs  . Lack of Transportation (Medical): No  . Lack of Transportation (Non-Medical): No  Physical Activity:   . Days of  Exercise per Week: Not on file  . Minutes of Exercise per Session: Not on file  Stress:   . Feeling of Stress : Not on file  Social Connections:   . Frequency of Communication with Friends and Family: Not on file  . Frequency of Social Gatherings with Friends and Family: Not on file  . Attends Religious Services: Not on file  . Active Member of Clubs or Organizations: Not on file  . Attends Archivist Meetings: Not on file  . Marital Status: Not on file  Intimate Partner Violence:   . Fear of Current or Ex-Partner: Not on file  . Emotionally Abused: Not on file  . Physically Abused: Not on file  . Sexually Abused: Not on file   Review of Systems  No trauma to foot No fever     Objective:   Physical Exam         Assessment & Plan:

## 2019-06-20 NOTE — Assessment & Plan Note (Signed)
Has lesion on foot which is obvious wart He asks if I can treat it here

## 2019-06-20 NOTE — Assessment & Plan Note (Addendum)
Discussed alternatives He gave verbal consent  Scaled with #15 scalpel Liquid nitrogen ~60 seconds x 2 Tolerated well Discussed home care  Full exam of hands and then left foot did show another wart on left plantar surface That lesion also treated (30 seconds x 2)

## 2019-07-05 ENCOUNTER — Ambulatory Visit: Payer: Medicare Other | Attending: Internal Medicine

## 2019-07-05 DIAGNOSIS — Z23 Encounter for immunization: Secondary | ICD-10-CM | POA: Insufficient documentation

## 2019-07-05 NOTE — Progress Notes (Signed)
   Covid-19 Vaccination Clinic  Name:  Ricardo Reese    MRN: [id number] DOB: 01/06/1951  07/05/2019  [name] was observed post Covid-19 immunization for 15 minutes without incidence. He was provided with Vaccine Information Sheet and instruction to access the V-Safe system.   [name] was instructed to call 911 with any severe reactions post vaccine: Marland Kitchen Difficulty breathing  . Swelling of your face and throat  . A fast heartbeat  . A bad rash all over your body  . Dizziness and weakness    Immunizations Administered    Name Date Dose VIS Date Route   Pfizer COVID-19 Vaccine 07/05/2019  2:26 PM 0.3 mL 05/24/2019 Intramuscular   Manufacturer: [place]   Lot: [id number]   [place]: [id number]

## 2019-07-15 DIAGNOSIS — C61 Malignant neoplasm of prostate: Secondary | ICD-10-CM | POA: Diagnosis not present

## 2019-07-22 DIAGNOSIS — C61 Malignant neoplasm of prostate: Secondary | ICD-10-CM | POA: Diagnosis not present

## 2019-07-23 IMAGING — NM NM BONE WHOLE BODY
2 series · 2 of 2 positions shown · non-contrast
Comparison: None

Correlation: CT pelvis 11/29/2017

CLINICAL DATA: Prostate cancer, PSA

EXAM:
NUCLEAR MEDICINE WHOLE BODY BONE SCAN
TECHNIQUE: Whole body anterior and posterior images were obtained approximately
3 hours after intravenous injection of radiopharmaceutical.
RADIOPHARMACEUTICALS:  20.2 mCi 7echnetium-22m MDP IV

[Series 1: wbr_bone_40 whole body · 2.66mm/px · 1 of 1 slices shown (1 of 2)]
[im 1/1]
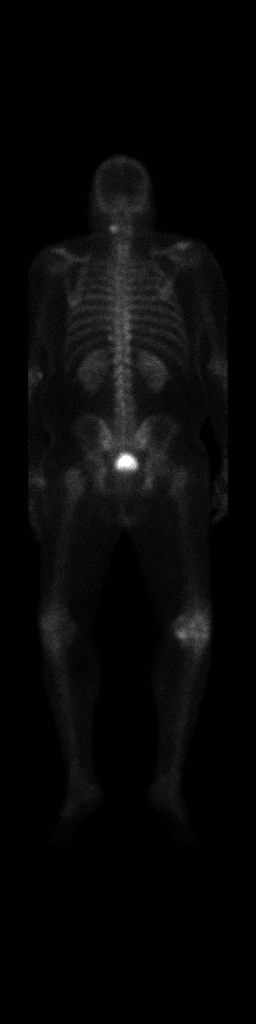

[Series 1: wbr_bone_40 whole body · 2.66mm/px · 1 of 1 slices shown (2 of 2)]
[im 1/1]
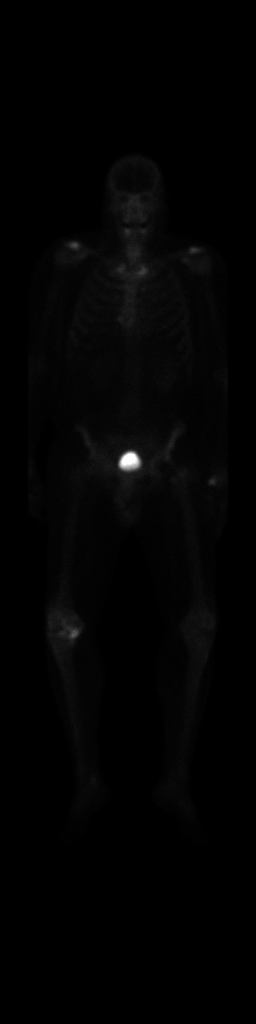

[2 of 2 positions shown; findings below may reference images not displayed]

FINDINGS: Uptake of tracer at the shoulders, sternoclavicular joints, LEFT
wrist, RIGHT knee, and RIGHT ankle, typically degenerative.

Uptake at the LEFT lateral aspect of the mid cervical spine is
typically degenerative.

Subtle focus of increased tracer accumulation at the RIGHT lateral
aspect of the distal sternum, may be related to costal cartilaginous
uptake, appears external to the sternum.

No definite abnormal sites of tracer accumulation are identified to
suggest osseous metastatic disease.

Photopenic defect at LEFT hip from LEFT hip prosthesis.

Expected urinary tract and soft tissue distribution of tracer.
IMPRESSION: Scattered degenerative type uptake as above.

No definite scintigraphic evidence of osseous metastatic disease.

## 2019-07-24 ENCOUNTER — Ambulatory Visit: Payer: Medicare Other | Attending: Internal Medicine

## 2019-07-24 DIAGNOSIS — Z23 Encounter for immunization: Secondary | ICD-10-CM | POA: Insufficient documentation

## 2019-07-24 NOTE — Progress Notes (Signed)
   Covid-19 Vaccination Clinic  Name:  Ricardo Reese    MRN: [id number] DOB: August 21, 1950  07/24/2019  [name] was observed post Covid-19 immunization for 15 minutes without incidence. He was provided with Vaccine Information Sheet and instruction to access the V-Safe system.   [name] was instructed to call 911 with any severe reactions post vaccine: Marland Kitchen Difficulty breathing  . Swelling of your face and throat  . A fast heartbeat  . A bad rash all over your body  . Dizziness and weakness    Immunizations Administered    Name Date Dose VIS Date Route   Pfizer COVID-19 Vaccine 07/24/2019  8:12 AM 0.3 mL 05/24/2019 Intramuscular   Manufacturer: [place]   Lot: [id number]   [place]: [id number]

## 2019-10-25 ENCOUNTER — Ambulatory Visit (INDEPENDENT_AMBULATORY_CARE_PROVIDER_SITE_OTHER): Payer: Medicare Other | Admitting: Internal Medicine

## 2019-10-25 ENCOUNTER — Other Ambulatory Visit: Payer: Self-pay

## 2019-10-25 ENCOUNTER — Encounter: Payer: Self-pay | Admitting: Internal Medicine

## 2019-10-25 VITALS — BP 130/74 | HR 69 | Temp 98.3°F | Ht 69.5 in | Wt 213.2 lb

## 2019-10-25 DIAGNOSIS — Z0000 Encounter for general adult medical examination without abnormal findings: Secondary | ICD-10-CM

## 2019-10-25 DIAGNOSIS — I10 Essential (primary) hypertension: Secondary | ICD-10-CM | POA: Diagnosis not present

## 2019-10-25 DIAGNOSIS — G8929 Other chronic pain: Secondary | ICD-10-CM

## 2019-10-25 DIAGNOSIS — Z7189 Other specified counseling: Secondary | ICD-10-CM | POA: Diagnosis not present

## 2019-10-25 DIAGNOSIS — E1159 Type 2 diabetes mellitus with other circulatory complications: Secondary | ICD-10-CM

## 2019-10-25 DIAGNOSIS — M25561 Pain in right knee: Secondary | ICD-10-CM | POA: Insufficient documentation

## 2019-10-25 LAB — CBC
HCT: 39.5 % (ref 39.0–52.0)
Hemoglobin: 13.6 g/dL (ref 13.0–17.0)
MCHC: 34.4 g/dL (ref 30.0–36.0)
MCV: 87.7 fl (ref 78.0–100.0)
Platelets: 228 10*3/uL (ref 150.0–400.0)
RBC: 4.5 Mil/uL (ref 4.22–5.81)
RDW: 14.4 % (ref 11.5–15.5)
WBC: 4.4 10*3/uL (ref 4.0–10.5)

## 2019-10-25 LAB — COMPREHENSIVE METABOLIC PANEL
ALT: 17 U/L (ref 0–53)
AST: 18 U/L (ref 0–37)
Albumin: 4.4 g/dL (ref 3.5–5.2)
Alkaline Phosphatase: 58 U/L (ref 39–117)
BUN: 19 mg/dL (ref 6–23)
CO2: 28 mEq/L (ref 19–32)
Calcium: 9.2 mg/dL (ref 8.4–10.5)
Chloride: 102 mEq/L (ref 96–112)
Creatinine, Ser: 0.78 mg/dL (ref 0.40–1.50)
GFR: 98.88 mL/min (ref >60.00)
Glucose, Bld: 124 mg/dL — ABNORMAL HIGH (ref 70–99)
Potassium: 4.1 mEq/L (ref 3.5–5.1)
Sodium: 137 mEq/L (ref 135–145)
Total Bilirubin: 0.5 mg/dL (ref 0.2–1.2)
Total Protein: 7 g/dL (ref 6.0–8.3)

## 2019-10-25 LAB — LIPID PANEL
Cholesterol: 194 mg/dL (ref 0–200)
HDL: 54.1 mg/dL (ref >39.00)
LDL Cholesterol: 108 mg/dL — ABNORMAL HIGH (ref 0–99)
NonHDL: 139.85
Total CHOL/HDL Ratio: 4
Triglycerides: 160 mg/dL — ABNORMAL HIGH (ref 0.0–149.0)
VLDL: 32 mg/dL (ref 0.0–40.0)

## 2019-10-25 LAB — HM DIABETES FOOT EXAM

## 2019-10-25 LAB — HEMOGLOBIN A1C: Hgb A1c MFr Bld: 6.3 % (ref 4.6–6.5)

## 2019-10-25 MED ORDER — METFORMIN HCL 500 MG PO TABS: 500.0000 mg | ORAL_TABLET | Freq: Two times a day (BID) | ORAL | 3 refills | Status: DC

## 2019-10-25 MED ORDER — LOSARTAN POTASSIUM-HCTZ 100-25 MG PO TABS: 1.0000 | ORAL_TABLET | Freq: Every day | ORAL | 3 refills | Status: DC

## 2019-10-25 MED ORDER — AMLODIPINE BESYLATE 5 MG PO TABS: 5.0000 mg | ORAL_TABLET | Freq: Every day | ORAL | 3 refills | Status: DC

## 2019-10-25 MED ORDER — PRAVASTATIN SODIUM 40 MG PO TABS: 40.0000 mg | ORAL_TABLET | Freq: Every day | ORAL | 3 refills | Status: DC

## 2019-10-25 NOTE — Assessment & Plan Note (Signed)
Still seems to have good control Has HTN that is controlled Had eye exam--will try to get records On statin

## 2019-10-25 NOTE — Progress Notes (Signed)
Hearing Screening   125Hz  250Hz  500Hz  1000Hz  2000Hz  3000Hz  4000Hz  6000Hz  8000Hz   Right ear:   20 20 20  20     Left ear:   20 20 20  20       Visual Acuity Screening   Right eye Left eye Both eyes  Without correction:     With correction: 20/25 20/25 20/25

## 2019-10-25 NOTE — Assessment & Plan Note (Signed)
See social history 

## 2019-10-25 NOTE — Assessment & Plan Note (Signed)
I have personally reviewed the Medicare Annual Wellness questionnaire and have noted 1. The patient's medical and social history 2. Their use of alcohol, tobacco or illicit drugs 3. Their current medications and supplements 4. The patient's functional ability including ADL's, fall risks, home safety risks and hearing or visual             impairment. 5. Diet and physical activities 6. Evidence for depression or mood disorders  The patients weight, height, BMI and visual acuity have been recorded in the chart I have made referrals, counseling and provided education to the patient based review of the above and I have provided the pt with a written personalized care plan for preventive services.  I have provided you with a copy of your personalized plan for preventive services. Please take the time to review along with your updated medication list.  Td at pharmacy Consider shingrix Colon due 2023 Discussed exercise Flu vaccine in the fall

## 2019-10-25 NOTE — Progress Notes (Signed)
Subjective:    Patient ID: Ricardo Reese, male    DOB: 28-Mar-1951, 69 y.o.   MRN: [id number]  HPI Here for Medicare wellness visit and follow up of chronic health conditions This visit occurred during the SARS-CoV-2 public health emergency.  Safety protocols were in place, including screening questions prior to the visit, additional usage of staff PPE, and extensive cleaning of exam room while observing appropriate contact time as indicated for disinfecting solutions.   Reviewed form and advance directives Reviewed other doctors Occasional beer or 2 No tobacco Plays golf, walks, yard work Media planner) Vision is fine Hearing is good No falls No depression or anhedonia Independent with instrumental ADLs No memory problems  Keeps up with Dr Junious Silk Had the RT Last PSA stable---no other Rx  Checking sugars once a week or so Generally okay--- 95-105 Did have diabetic eye exam--- ?October No foot numbness, sores or pain  No chest pain No palpitations  No dizziness or syncope No edema  Having some right knee pain Swells at times--not sure if related to prior surgery Notices it after walking a lot--or it twists it playing golf Brace slightly helpful Ibuprofen 400mg  bid if bothering him  Has new lumps in both palms Over 3rd flexor tendons No pain  Current Outpatient Medications on File Prior to Visit  Medication Sig Dispense Refill  . amLODipine (NORVASC) 5 MG tablet Take 1 tablet (5 mg total) by mouth daily. 90 tablet 3  . B Complex-C (SUPER B COMPLEX PO) Take 1 tablet by mouth daily.     . fish oil-omega-3 fatty acids 1000 MG capsule Take 1 g by mouth daily.    Marland Kitchen glucose blood (CONTOUR NEXT TEST) test strip Use to obtain blood sugar once a day. Dx Code E11.9 100 each 3  . losartan-hydrochlorothiazide (HYZAAR) 100-25 MG tablet Take 1 tablet by mouth daily. 90 tablet 3  . metFORMIN (GLUCOPHAGE) 500 MG tablet Take 1 tablet (500 mg total) by mouth 2 (two) times daily with a  meal. 180 tablet 3  . pravastatin (PRAVACHOL) 40 MG tablet Take 1 tablet (40 mg total) by mouth daily. 90 tablet 3  . sildenafil (REVATIO) 20 MG tablet Take 3-5 tablets (60-100 mg total) by mouth daily as needed. 50 tablet 11   No current facility-administered medications on file prior to visit.    Allergies  Allergen Reactions  . No Known Allergies     Past Medical History:  Diagnosis Date  . Diabetes mellitus without complication ([place])   . History of kidney stones    has had twice  . Hyperlipidemia   . Hypertension   . Impaired fasting glucose   . Kidney stone 1983  . Melanoma of back [facility]) 2013   Dr Evorn Gong  . Pneumonia    walking back in 2002  . Prostate cancer ([place])   . 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 KNEE SURGERY  1969   cartilage removal right knee   . melanoma back  09/2010  . PROSTATE BIOPSY    . 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   Procedure: LEFT TOTAL HIP ARTHROPLASTY ANTERIOR APPROACH;  Surgeon: Mcarthur Rossetti, MD;  Location: [facility];  Service: Orthopedics;  Laterality: Left;    Family History  Problem Relation Age of Onset  . Stroke Father   . Lung cancer Father   .  Prostate cancer Brother   . Heart disease Brother   . Diabetes Neg Hx   . Colon cancer Neg Hx   . Stomach cancer Neg Hx   . Esophageal cancer Neg Hx   . Rectal cancer Neg Hx     Social History   Socioeconomic History  . Marital status: Married    Spouse name: [name]  . Number of children: 2  . Years of education: Not on file  . Highest education level: Not on file  Occupational History  . 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 Smoking status: Never Smoker  . Smokeless tobacco: Never Used  Substance and Sexual Activity  . Alcohol use: Yes    Alcohol/week: 3.0 - 4.0 standard drinks    Types: 3 - 4 Cans  of beer per week    Comment: occasional beer  . Drug use: No  . Sexual activity: Yes  Other Topics Concern  . Not on file  Social History Narrative   Has living will   Wife is health care POA--- daughters next   Would accept resuscitation   No tube feeds if cognitively unaware   Social Determinants of Health   Financial Resource Strain:   . Difficulty of Paying Living Expenses:   Food Insecurity:   . Worried About Charity fundraiser in the Last Year:   . Arboriculturist in the Last Year:   Transportation Needs:   . Film/video editor (Medical):   Marland Kitchen Lack of Transportation (Non-Medical):   Physical Activity:   . Days of Exercise per Week:   . Minutes of Exercise per Session:   Stress:   . Feeling of Stress :   Social Connections:   . Frequency of Communication with Friends and Family:   . Frequency of Social Gatherings with Friends and Family:   . Attends Religious Services:   . Active Member of Clubs or Organizations:   . Attends Archivist Meetings:   Marland Kitchen Marital Status:   Intimate Partner Violence:   . Fear of Current or Ex-Partner:   . Emotionally Abused:   Marland Kitchen Physically Abused:   . Sexually Abused:    Review of Systems Appetite is fine Has gained some weight--plans to get it off Sleeps okay Wears seat belt Needed an extraction last year---wisdom tooth. Otherwise okay No heartburn or dysphagia Bowels are fine--no blood No urinary problems. Rare nocturia No other joint problems No headaches    Objective:   Physical Exam  Constitutional: He is oriented to person, place, and time. He appears well-developed. No distress.  HENT:  Mouth/Throat: Oropharynx is clear and moist.  No oral lesions  Neck: No thyromegaly present.  Cardiovascular: Normal rate, regular rhythm, normal heart sounds and intact distal pulses. Exam reveals no gallop.  No murmur heard. Respiratory: Effort normal and breath sounds normal. No respiratory distress. He has no wheezes.  He has no rales.  GI: Soft. There is no abdominal tenderness.  Musculoskeletal:        General: No tenderness or edema.     Comments: No swelling, ligament instability or meniscus findings in right knee  Small tendon nodules mid 3rd finger tendons---right>left  Lymphadenopathy:    He has no cervical adenopathy.  Neurological: He is alert and oriented to person, place, and time.  President-- "[place]" [phone] [id number] Recall 3/3  Normal sensation in feet  Skin: No  rash noted.  No foot lesions  Psychiatric: He has a normal mood and affect. His behavior is normal.           Assessment & Plan:

## 2019-10-25 NOTE — Assessment & Plan Note (Signed)
Probably early arthritic changes--no worrisome findings Brace, ibuprofen prn

## 2019-10-25 NOTE — Assessment & Plan Note (Signed)
BP Readings from Last 3 Encounters:  10/25/19 130/74  06/20/19 132/88  04/26/19 110/64   Good control on medication--losartan HCTZ and amlodpine

## 2019-12-10 ENCOUNTER — Ambulatory Visit (INDEPENDENT_AMBULATORY_CARE_PROVIDER_SITE_OTHER): Payer: Medicare Other | Admitting: Podiatry

## 2019-12-10 ENCOUNTER — Other Ambulatory Visit: Payer: Self-pay

## 2019-12-10 ENCOUNTER — Encounter: Payer: Self-pay | Admitting: Podiatry

## 2019-12-10 DIAGNOSIS — M79675 Pain in left toe(s): Secondary | ICD-10-CM

## 2019-12-10 DIAGNOSIS — L600 Ingrowing nail: Secondary | ICD-10-CM

## 2019-12-10 NOTE — Progress Notes (Signed)
Subjective:  Patient ID: Ricardo Reese, male    DOB: May 02, 1951,  MRN: [id number]  Chief Complaint  Patient presents with  . Ingrown Toenail    pt is here for a possible ingrown of the left great toenail medial side, pt states that pain is elevated to the touch.    69 y.o. male presents with the above complaint.  Patient presents with complaint of left great medial hallux nail border that is ingrown.  Patient states been hurting for quite some time.  Patient states pain is elevated when wearing shoes.  There is no oozing or purulent drainage noted.  Patient has tried trimming it but is not helped much.  He would like to have removed.  He denies seeing anyone else prior to seeing me.  Pain scale 7 out of 10.  Is dull achy in nature.   Review of Systems: Negative except as noted in the HPI. Denies N/V/F/Ch.  Past Medical History:  Diagnosis Date  . Diabetes mellitus without complication ([place])   . History of kidney stones    has had twice  . Hyperlipidemia   . Hypertension   . Impaired fasting glucose   . Kidney stone 1983  . Melanoma of back [facility]) 2013   Dr Evorn Gong  . Pneumonia    walking back in 2002  . Prostate cancer ([place])   . 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  amLODipine (NORVASC) 5 MG tablet, Take 1 tablet (5 mg total) by mouth daily., Disp: 90 tablet, Rfl: 3 .  B Complex-C (SUPER B COMPLEX PO), Take 1 tablet by mouth daily. , Disp: , Rfl:  .  fish oil-omega-3 fatty acids 1000 MG capsule, Take 1 g by mouth daily., Disp: , Rfl:  .  glucose blood (CONTOUR NEXT TEST) test strip, Use to obtain blood sugar once a day. Dx Code E11.9, Disp: 100 each, Rfl: 3 .  losartan-hydrochlorothiazide (HYZAAR) 100-25 MG tablet, Take 1 tablet by mouth daily., Disp: 90 tablet, Rfl: 3 .  metFORMIN (GLUCOPHAGE) 500 MG tablet, Take 1 tablet (500 mg total) by mouth 2 (two) times daily with a meal., Disp: 180 tablet, Rfl: 3 .  pravastatin  (PRAVACHOL) 40 MG tablet, Take 1 tablet (40 mg total) by mouth daily., Disp: 90 tablet, Rfl: 3 .  sildenafil (REVATIO) 20 MG tablet, Take 3-5 tablets (60-100 mg total) by mouth daily as needed., Disp: 50 tablet, Rfl: 11  Social History   Tobacco Use  Smoking Status Never Smoker  Smokeless Tobacco Never Used    Allergies  Allergen Reactions  . No Known Allergies    Objective:  There were no vitals filed for this visit. There is no height or weight on file to calculate BMI. Constitutional Well developed. Well nourished.  Vascular Dorsalis pedis pulses palpable bilaterally. Posterior tibial pulses palpable bilaterally. Capillary refill normal to all digits.  No cyanosis or clubbing noted. Pedal hair growth normal.  Neurologic Normal speech. Oriented to person, place, and time. Epicritic sensation to light touch grossly present bilaterally.  Dermatologic Painful ingrowing nail at medial nail borders of the hallux nail left. No other open wounds. No skin lesions.  Orthopedic: Normal joint ROM without pain or crepitus bilaterally. No visible deformities. No bony tenderness.   Radiographs: None Assessment:   1. Ingrown left big toenail   2. Great toe pain, left    Plan:  Patient was evaluated and treated and all  questions answered.  Ingrown Nail, left -Patient elects to proceed with minor surgery to remove ingrown toenail removal today. Consent reviewed and signed by patient. -Ingrown nail excised. See procedure note. -Educated on post-procedure care including soaking. Written instructions provided and reviewed. -Patient to follow up in 2 weeks for nail check.  Procedure: Excision of Ingrown Toenail Location: Left 1st toe medial nail borders. Anesthesia: Lidocaine 1% plain; 1.5 mL and Marcaine 0.5% plain; 1.5 mL, digital block. Skin Prep: Betadine. Dressing: Silvadene; telfa; dry, sterile, compression dressing. Technique: Following skin prep, the toe was exsanguinated  and a tourniquet was secured at the base of the toe. The affected nail border was freed, split with a nail splitter, and excised. Chemical matrixectomy was then performed with phenol and irrigated out with alcohol. The tourniquet was then removed and sterile dressing applied. Disposition: Patient tolerated procedure well. Patient to return in 2 weeks for follow-up.   No follow-ups on file.

## 2020-01-21 DIAGNOSIS — Z8546 Personal history of malignant neoplasm of prostate: Secondary | ICD-10-CM | POA: Diagnosis not present

## 2020-01-21 DIAGNOSIS — E349 Endocrine disorder, unspecified: Secondary | ICD-10-CM | POA: Diagnosis not present

## 2020-01-28 ENCOUNTER — Other Ambulatory Visit: Payer: Self-pay

## 2020-01-28 ENCOUNTER — Encounter: Payer: Self-pay | Admitting: Podiatry

## 2020-01-28 ENCOUNTER — Ambulatory Visit (INDEPENDENT_AMBULATORY_CARE_PROVIDER_SITE_OTHER): Payer: Medicare Other | Admitting: Podiatry

## 2020-01-28 DIAGNOSIS — M79675 Pain in left toe(s): Secondary | ICD-10-CM | POA: Diagnosis not present

## 2020-01-28 DIAGNOSIS — L600 Ingrowing nail: Secondary | ICD-10-CM | POA: Diagnosis not present

## 2020-01-28 DIAGNOSIS — M79674 Pain in right toe(s): Secondary | ICD-10-CM

## 2020-01-28 DIAGNOSIS — B351 Tinea unguium: Secondary | ICD-10-CM

## 2020-01-29 ENCOUNTER — Encounter: Payer: Self-pay | Admitting: Podiatry

## 2020-01-29 NOTE — Progress Notes (Signed)
Subjective: Ricardo Reese is a 69 y.o.  male returns to office today for follow up evaluation after having left Hallux Medial border nail avulsion performed. Patient has been soaking using epsom salt and applying topical antibiotic covered with bandaid daily. Patient denies fevers, chills, nausea, vomiting. Denies any calf pain, chest pain, SOB.   Objective:  Vitals: Reviewed  General: Well developed, nourished, in no acute distress, alert and oriented x3   Dermatology: Skin is warm, dry and supple bilateral. Medial hallux nail border appears to be clean, dry, with mild granular tissue and surrounding scab. There is no surrounding erythema, edema, drainage/purulence. The remaining nails appear unremarkable at this time. There are no other lesions or other signs of infection present. Nail Exam: Pt has thick disfigured discolored nails with subungual debris noted bilateral entire nail hallux through fifth toenails.  Pain on palpation to the nails.  Neurovascular status: Intact. No lower extremity swelling; No pain with calf compression bilateral.  Musculoskeletal: Decreased tenderness to palpation of the Medial hallux nail fold(s). Muscular strength within normal limits bilateral.   Assesement and Plan: S/p partial nail avulsion, doing well.   -Continue soaking in epsom salts twice a day followed by antibiotic ointment and a band-aid. Can leave uncovered at night. Continue this until completely healed.  -If the area has not healed in 2 weeks, call the office for follow-up appointment, or sooner if any problems arise.  -Monitor for any signs/symptoms of infection. Call the office immediately if any occur or go directly to the emergency room. Call with any questions/concerns.   Onychomycosis with pain  -Nails palliatively debrided as below. -Educated on self-care  Procedure: Nail Debridement Rationale: pain  Type of Debridement: manual, sharp debridement. Instrumentation: Nail nipper,  rotary burr. Number of Nails: 10  Procedures and Treatment: Consent by patient was obtained for treatment procedures. The patient understood the discussion of treatment and procedures well. All questions were answered thoroughly reviewed. Debridement of mycotic and hypertrophic toenails, 1 through 5 bilateral and clearing of subungual debris. No ulceration, no infection noted.  Return Visit-Office Procedure: Patient instructed to return to the office for a follow up visit 3 months for continued evaluation and treatment.  Boneta Lucks, DPM    No follow-ups on file.   Boneta Lucks, DPM

## 2020-03-18 ENCOUNTER — Encounter: Payer: Self-pay | Admitting: Physician Assistant

## 2020-03-18 ENCOUNTER — Ambulatory Visit: Payer: Self-pay

## 2020-03-18 ENCOUNTER — Ambulatory Visit (INDEPENDENT_AMBULATORY_CARE_PROVIDER_SITE_OTHER): Payer: Medicare Other | Admitting: Physician Assistant

## 2020-03-18 DIAGNOSIS — M7061 Trochanteric bursitis, right hip: Secondary | ICD-10-CM

## 2020-03-18 MED ORDER — LIDOCAINE HCL 1 % IJ SOLN
3.0000 mL | INTRAMUSCULAR | Status: AC | PRN
  Administered 2020-03-18: 3 mL

## 2020-03-18 MED ORDER — METHYLPREDNISOLONE ACETATE 40 MG/ML IJ SUSP
40.0000 mg | INTRAMUSCULAR | Status: AC | PRN
  Administered 2020-03-18: 40 mg via INTRA_ARTICULAR

## 2020-03-18 NOTE — Progress Notes (Signed)
Office Visit Note   Patient: Ricardo Reese           Date of Birth: [birth year]           MRN: [id number] Visit Date: 03/18/2020              Requested by: Venia Carbon, MD [place],  [place] [address] PCP: Venia Carbon, MD   Assessment & Plan: Visit Diagnoses:  1. Trochanteric bursitis of right hip     Plan: He is shown on hamstring stretching exercises also IT band exercises I had him demonstrate these back to me.  We will see him back in 4 weeks see what type of response he had to the injection and the exercises.  Questions encouraged and answered  Follow-Up Instructions: Return in about 4 weeks (around 04/15/2020).   Orders:  Orders Placed This Encounter  Procedures  . Large Joint Inj  . XR HIP UNILAT W OR W/O PELVIS 2-3 VIEWS RIGHT   No orders of the defined types were placed in this encounter.     Procedures: Large Joint Inj: R greater trochanter on 03/18/2020 4:11 PM Indications: pain Details: 22 G 1.5 in needle, lateral approach  Arthrogram: No  Medications: 3 mL lidocaine 1 %; 40 mg methylPREDNISolone acetate 40 MG/ML Outcome: tolerated well, no immediate complications Procedure, treatment alternatives, risks and benefits explained, specific risks discussed. Consent was given by the patient. Immediately prior to procedure a time out was called to verify the correct patient, procedure, equipment, support staff and site/side marked as required. Patient was prepped and draped in the usual sterile fashion.       Clinical Data: No additional findings.   Subjective: Chief Complaint  Patient presents with  . Right Hip - Pain    HPI Ricardo Reese is known to Dr. Ninfa Linden service with a history of a left total hip arthroplasty April 2018.  His left hip is doing well.  He has had pain in the right hip for a week.  Endplate 3 days of golf and his pain increase.  Pain is mostly lateral aspect of the hip does radiate down past the knee at  times.  No groin pain.  Feels as if the hips, give way at times.  He has difficulty lying on the hip at night.  He is having no numbness tingling down the right leg.  No known injury.  Review of Systems Negative for fevers chills shortness of breath chest pain  Objective: Vital Signs: There were no vitals taken for this visit.  Physical Exam Constitutional:      Appearance: He is not ill-appearing or diaphoretic.  Pulmonary:     Effort: Pulmonary effort is normal.  Neurological:     Mental Status: He is alert and oriented to person, place, and time.  Psychiatric:        Mood and Affect: Mood normal.     Ortho Exam Bilateral hips excellent range of motion without pain.  Tenderness over the right trochanteric region.  Exquisitely tight hamstrings bilaterally.  Negative straight leg raise bilaterally.  Specialty Comments:  No specialty comments available.  Imaging: XR HIP UNILAT W OR W/O PELVIS 2-3 VIEWS RIGHT  Result Date: 03/18/2020 AP pelvis lateral view right hip: No acute fractures.  Bilateral hips well located.  Left total hip arthroplasty components well-seated without any complicating features.  Right hip joint minimal arthritic changes.    PMFS History: Patient Active Problem List  Diagnosis Date Noted  . Right knee pain 10/25/2019  . Left foot pain 06/20/2019  . Malignant neoplasm of prostate ([place]) 01/02/2018  . Advance directive discussed with patient 09/26/2016  . Unilateral primary osteoarthritis, left hip 09/20/2016  . Status post left hip replacement 09/20/2016  . Routine general medical examination at a health care facility 08/17/2011  . History of melanoma   . Hyperlipemia 05/20/2008  . Benign essential hypertension 05/20/2008  . Type 2 diabetes mellitus with other circulatory complications ([place]) [id number]/32/9191   Past Medical History:  Diagnosis Date  . Diabetes mellitus without complication ([place])   . History of kidney stones    has had twice  .  Hyperlipidemia   . Hypertension   . Impaired fasting glucose   . Kidney stone 1983  . Melanoma of back [facility]) 2013   Dr Evorn Gong  . Pneumonia    walking back in 2002  . Prostate cancer ([place])   . 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 Stroke Father   . Lung cancer Father   . Prostate cancer Brother   . Heart disease Brother   . Diabetes Neg Hx   . Colon cancer Neg Hx   . Stomach cancer Neg Hx   . Esophageal cancer Neg Hx   . Rectal cancer Neg Hx     Past Surgical History:  Procedure Laterality Date  . KNEE SURGERY  1969   cartilage removal right knee   . melanoma back  09/2010  . PROSTATE BIOPSY    . 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   Procedure: LEFT TOTAL HIP ARTHROPLASTY ANTERIOR APPROACH;  Surgeon: Mcarthur Rossetti, MD;  Location: [facility];  Service: Orthopedics;  Laterality: Left;   Social History   Occupational History  . 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 Smoking status: Never Smoker  . Smokeless tobacco: Never Used  Vaping Use  . Vaping Use: Never used  Substance and Sexual Activity  . Alcohol use: Yes    Alcohol/week: 3.0 - 4.0 standard drinks    Types: 3 - 4 Cans of beer per week    Comment: occasional beer  . Drug use: No  . Sexual activity: Yes

## 2020-03-19 ENCOUNTER — Ambulatory Visit (INDEPENDENT_AMBULATORY_CARE_PROVIDER_SITE_OTHER): Payer: Medicare Other

## 2020-03-19 DIAGNOSIS — Z23 Encounter for immunization: Secondary | ICD-10-CM | POA: Diagnosis not present

## 2020-03-20 ENCOUNTER — Ambulatory Visit
Admission: EM | Admit: 2020-03-20 | Discharge: 2020-03-20 | Disposition: A | Discharge status: Home / Self Care | Payer: Medicare Other | Attending: Emergency Medicine | Admitting: Emergency Medicine

## 2020-03-20 DIAGNOSIS — Z20822 Contact with and (suspected) exposure to covid-19: Secondary | ICD-10-CM | POA: Diagnosis not present

## 2020-03-20 NOTE — ED Triage Notes (Signed)
Pt here for covid test. Had an exposure to covid on Sunday. No symptoms at this time.

## 2020-03-20 NOTE — Discharge Instructions (Signed)

## 2020-03-22 LAB — SARS-COV-2, NAA 2 DAY TAT

## 2020-03-22 LAB — NOVEL CORONAVIRUS, NAA: SARS-CoV-2, NAA: NOT DETECTED

## 2020-03-28 ENCOUNTER — Ambulatory Visit: Payer: Medicare Other | Attending: Internal Medicine

## 2020-03-28 DIAGNOSIS — Z23 Encounter for immunization: Secondary | ICD-10-CM

## 2020-03-28 NOTE — Progress Notes (Signed)
   Covid-19 Vaccination Clinic  Name:  KASS HERBERGER    MRN: [id number] DOB: 1950/12/03  03/28/2020  Mr. Zara was observed post Covid-19 immunization for 15 minutes without incident. He was provided with Vaccine Information Sheet and instruction to access the V-Safe system.   Mr. Eoff was instructed to call 911 with any severe reactions post vaccine: Marland Kitchen Difficulty breathing  . Swelling of face and throat  . A fast heartbeat  . A bad rash all over body  . Dizziness and weakness

## 2020-04-15 ENCOUNTER — Ambulatory Visit: Payer: Medicare Other | Admitting: Physician Assistant

## 2020-04-28 ENCOUNTER — Ambulatory Visit (INDEPENDENT_AMBULATORY_CARE_PROVIDER_SITE_OTHER): Payer: Medicare Other | Admitting: Internal Medicine

## 2020-04-28 ENCOUNTER — Encounter: Payer: Self-pay | Admitting: Internal Medicine

## 2020-04-28 ENCOUNTER — Other Ambulatory Visit: Payer: Self-pay

## 2020-04-28 VITALS — BP 124/74 | HR 65 | Temp 97.7°F | Ht 70.0 in | Wt 208.0 lb

## 2020-04-28 DIAGNOSIS — E1159 Type 2 diabetes mellitus with other circulatory complications: Secondary | ICD-10-CM | POA: Diagnosis not present

## 2020-04-28 DIAGNOSIS — I358 Other nonrheumatic aortic valve disorders: Secondary | ICD-10-CM | POA: Diagnosis not present

## 2020-04-28 DIAGNOSIS — I10 Essential (primary) hypertension: Secondary | ICD-10-CM | POA: Diagnosis not present

## 2020-04-28 DIAGNOSIS — R252 Cramp and spasm: Secondary | ICD-10-CM | POA: Diagnosis not present

## 2020-04-28 LAB — POCT GLYCOSYLATED HEMOGLOBIN (HGB A1C): Hemoglobin A1C: 6.1 % — AB (ref 4.0–5.6)

## 2020-04-28 MED ORDER — SILDENAFIL CITRATE 20 MG PO TABS: 60.0000 mg | ORAL_TABLET | Freq: Every day | ORAL | 11 refills | Status: DC | PRN

## 2020-04-28 NOTE — Progress Notes (Signed)
Subjective:    Patient ID: Ricardo Reese, male    DOB: [birth year], 69 y.o.   MRN: [id number]  HPI Here for follow up of diabetes and some acute issues This visit occurred during the SARS-CoV-2 public health emergency.  Safety protocols were in place, including screening questions prior to the visit, additional usage of staff PPE, and extensive cleaning of exam room while observing appropriate contact time as indicated for disinfecting solutions.   Feels good overall but he gets tired in the evening Sleeps the "same as I always have" Falls asleep in the chair ~8-9PM. Sleeps for 2 hours---wakes and goes to bed. Awakens 5AM in general--that is his schedule Feels good upon awakening  Still playing golf, doing yard work No set exercise  Checking sugars every 2 weeks or so Last 105---this is typical No chest pain No SOB Exercise tolerance is stable  Having some leg cramps wake him up now Happening often --but not every night Doesn't depend on his activity  Current Outpatient Medications on File Prior to Visit  Medication Sig Dispense Refill  . amLODipine (NORVASC) 5 MG tablet Take 1 tablet (5 mg total) by mouth daily. 90 tablet 3  . B Complex-C (SUPER B COMPLEX PO) Take 1 tablet by mouth daily.     . fish oil-omega-3 fatty acids 1000 MG capsule Take 1 g by mouth daily.    Marland Kitchen glucose blood (CONTOUR NEXT TEST) test strip Use to obtain blood sugar once a day. Dx Code E11.9 100 each 3  . losartan-hydrochlorothiazide (HYZAAR) 100-25 MG tablet Take 1 tablet by mouth daily. 90 tablet 3  . metFORMIN (GLUCOPHAGE) 500 MG tablet Take 1 tablet (500 mg total) by mouth 2 (two) times daily with a meal. 180 tablet 3  . pravastatin (PRAVACHOL) 40 MG tablet Take 1 tablet (40 mg total) by mouth daily. 90 tablet 3  . sildenafil (REVATIO) 20 MG tablet Take 3-5 tablets (60-100 mg total) by mouth daily as needed. 50 tablet 11   No current facility-administered medications on file prior to visit.     Allergies  Allergen Reactions  . No Known Allergies     Past Medical History:  Diagnosis Date  . Diabetes mellitus without complication ([place])   . History of kidney stones    has had twice  . Hyperlipidemia   . Hypertension   . Impaired fasting glucose   . Kidney stone 1983  . Melanoma of back [facility]) 2013   Dr Evorn Gong  . Pneumonia    walking back in 2002  . Prostate cancer ([place])   . 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 KNEE SURGERY  1969   cartilage removal right knee   . melanoma back  09/2010  . PROSTATE BIOPSY    . 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   Procedure: LEFT TOTAL HIP ARTHROPLASTY ANTERIOR APPROACH;  Surgeon: Mcarthur Rossetti, MD;  Location: [facility];  Service: Orthopedics;  Laterality: Left;    Family History  Problem Relation Age of Onset  . Stroke Father   . Lung cancer Father   . Prostate cancer Brother   . Heart disease Brother   . Diabetes Neg Hx   . Colon cancer Neg Hx   . Stomach cancer Neg Hx   . Esophageal cancer Neg Hx   . Rectal cancer Neg Hx  Social History   Socioeconomic History  . Marital status: Married    Spouse name: [name]  . Number of children: 2  . Years of education: Not on file  . Highest education level: Not on file  Occupational History  . 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 Smoking status: Never Smoker  . Smokeless tobacco: Never Used  Vaping Use  . Vaping Use: Never used  Substance and Sexual Activity  . Alcohol use: Yes    Alcohol/week: 3.0 - 4.0 standard drinks    Types: 3 - 4 Cans of beer per week    Comment: occasional beer  . Drug use: No  . Sexual activity: Yes  Other Topics Concern  . Not on file  Social History Narrative   Has living will   Wife is health care POA--- daughters next   Would accept resuscitation   No tube  feeds if cognitively unaware   Social Determinants of Health   Financial Resource Strain:   . Difficulty of Paying Living Expenses: Not on file  Food Insecurity:   . Worried About Charity fundraiser in the Last Year: Not on file  . Ran Out of Food in the Last Year: Not on file  Transportation Needs:   . Lack of Transportation (Medical): Not on file  . Lack of Transportation (Non-Medical): Not on file  Physical Activity:   . Days of Exercise per Week: Not on file  . Minutes of Exercise per Session: Not on file  Stress:   . Feeling of Stress : Not on file  Social Connections:   . Frequency of Communication with Friends and Family: Not on file  . Frequency of Social Gatherings with Friends and Family: Not on file  . Attends Religious Services: Not on file  . Active Member of Clubs or Organizations: Not on file  . Attends Archivist Meetings: Not on file  . Marital Status: Not on file  Intimate Partner Violence:   . Fear of Current or Ex-Partner: Not on file  . Emotionally Abused: Not on file  . Physically Abused: Not on file  . Sexually Abused: Not on file   Review of Systems Did have right hip bursitis--now doing stretching Appetite is fine Weight back down some    Objective:   Physical Exam Constitutional:      Appearance: Normal appearance.  Cardiovascular:     Rate and Rhythm: Normal rate and regular rhythm.     Pulses: Normal pulses.     Heart sounds: No gallop.      Comments: New coarse DD2/2 aortic systolic murmur Pulmonary:     Effort: Pulmonary effort is normal.     Breath sounds: Normal breath sounds. No wheezing or rales.  Musculoskeletal:     Cervical back: Neck supple.     Right lower leg: No edema.     Left lower leg: No edema.  Lymphadenopathy:     Cervical: No cervical adenopathy.  Skin:    Comments: No foot lesions  Neurological:     Mental Status: He is alert.  Psychiatric:        Mood and Affect: Mood normal.        Behavior:  Behavior normal.            Assessment & Plan:

## 2020-04-28 NOTE — Assessment & Plan Note (Signed)
This is new Will check echl

## 2020-04-28 NOTE — Assessment & Plan Note (Addendum)
BP Readings from Last 3 Encounters:  04/28/20 124/74  03/20/20 (!) 188/96  10/25/19 130/74   Doing well on the losartan/HCTZ "Fatigue" sounds like normal tiredness at end of day

## 2020-04-28 NOTE — Assessment & Plan Note (Signed)
No pathologic features Discussed OTC meds for this (potassium, magnesium, mustard, etc)

## 2020-04-28 NOTE — Assessment & Plan Note (Signed)
Doing well Good control Lab Results  Component Value Date   HGBA1C 6.1 (A) 04/28/2020   Will just continue metformin

## 2020-04-30 ENCOUNTER — Ambulatory Visit: Payer: Medicare Other | Admitting: Podiatry

## 2020-05-14 ENCOUNTER — Encounter: Payer: Self-pay | Admitting: Podiatry

## 2020-05-14 ENCOUNTER — Ambulatory Visit (INDEPENDENT_AMBULATORY_CARE_PROVIDER_SITE_OTHER): Payer: Medicare Other | Admitting: Podiatry

## 2020-05-14 ENCOUNTER — Other Ambulatory Visit: Payer: Self-pay

## 2020-05-14 DIAGNOSIS — M79674 Pain in right toe(s): Secondary | ICD-10-CM | POA: Diagnosis not present

## 2020-05-14 DIAGNOSIS — L988 Other specified disorders of the skin and subcutaneous tissue: Secondary | ICD-10-CM | POA: Diagnosis not present

## 2020-05-14 DIAGNOSIS — B351 Tinea unguium: Secondary | ICD-10-CM | POA: Diagnosis not present

## 2020-05-14 DIAGNOSIS — M79675 Pain in left toe(s): Secondary | ICD-10-CM | POA: Diagnosis not present

## 2020-05-15 ENCOUNTER — Encounter: Payer: Self-pay | Admitting: Podiatry

## 2020-05-15 NOTE — Progress Notes (Signed)
Subjective: Ricardo Reese is a 69 y.o.  male returns to office today for complaint of left 4th interdigital space maceration as well as thickened elongated dystrophic toenails x10.  He would like to have them debrided down.  He states that there is pain when ambulating.  As for the maceration he states is been present for quite some time.  He does not dry in between the toes.  He denies any other acute complaints.  Objective:  Vitals: Reviewed  General: Well developed, nourished, in no acute distress, alert and oriented x3   Dermatology: Skin is warm, dry and supple bilateral.  Interdigital maceration noted without any underlying wound to the left 4th and 5th digit interdigital space.  No clinical signs of infection noted. Nail Exam: Pt has thick disfigured discolored nails with subungual debris noted bilateral entire nail hallux through fifth toenails.  Pain on palpation to the nails.  Neurovascular status: Intact. No lower extremity swelling; No pain with calf compression bilateral.  Musculoskeletal: Decreased tenderness to palpation of the Medial hallux nail fold(s). Muscular strength within normal limits bilateral.   Assesement and Plan:   Interdigital maceration 4th and 5th digit on the left -I explained to the patient the etiology of maceration various treatment options were discussed.  I told him that given that he is a diabetic he should be careful and keep it dry between the toes.  I discussed with him to Betadine paint in between the toes over the next few weeks to allow it to dry up.  He states understanding and will do so.  Onychomycosis with pain  -Nails palliatively debrided as below. -Educated on self-care  Procedure: Nail Debridement Rationale: pain  Type of Debridement: manual, sharp debridement. Instrumentation: Nail nipper, rotary burr. Number of Nails: 10  Procedures and Treatment: Consent by patient was obtained for treatment procedures. The patient understood the  discussion of treatment and procedures well. All questions were answered thoroughly reviewed. Debridement of mycotic and hypertrophic toenails, 1 through 5 bilateral and clearing of subungual debris. No ulceration, no infection noted.  Return Visit-Office Procedure: Patient instructed to return to the office for a follow up visit 3 months for continued evaluation and treatment.  Boneta Lucks, DPM    No follow-ups on file.   Boneta Lucks, DPM

## 2020-05-21 ENCOUNTER — Other Ambulatory Visit: Payer: Self-pay

## 2020-05-21 ENCOUNTER — Ambulatory Visit (INDEPENDENT_AMBULATORY_CARE_PROVIDER_SITE_OTHER): Payer: Medicare Other

## 2020-05-21 DIAGNOSIS — I358 Other nonrheumatic aortic valve disorders: Secondary | ICD-10-CM

## 2020-05-21 LAB — ECHOCARDIOGRAM COMPLETE
AR max vel: 1.83 cm2
AV Area VTI: 1.64 cm2
AV Area mean vel: 1.62 cm2
AV Mean grad: 13 mmHg
AV Peak grad: 23.5 mmHg
Ao pk vel: 2.43 m/s
Area-P 1/2: 3.2 cm2
S' Lateral: 3.7 cm
Single Plane A4C EF: 61 %

## 2020-05-21 MED ORDER — PERFLUTREN LIPID MICROSPHERE
1.0000 mL | INTRAVENOUS | Status: AC | PRN
  Administered 2020-05-21: 2 mL via INTRAVENOUS

## 2020-07-20 DIAGNOSIS — Z8546 Personal history of malignant neoplasm of prostate: Secondary | ICD-10-CM | POA: Diagnosis not present

## 2020-07-27 DIAGNOSIS — Z8546 Personal history of malignant neoplasm of prostate: Secondary | ICD-10-CM | POA: Diagnosis not present

## 2020-07-27 DIAGNOSIS — N5201 Erectile dysfunction due to arterial insufficiency: Secondary | ICD-10-CM | POA: Diagnosis not present

## 2020-08-04 ENCOUNTER — Other Ambulatory Visit: Payer: Self-pay

## 2020-08-04 MED ORDER — LOSARTAN POTASSIUM 100 MG PO TABS: 100.0000 mg | ORAL_TABLET | Freq: Every day | ORAL | 3 refills | Status: DC

## 2020-08-04 MED ORDER — HYDROCHLOROTHIAZIDE 25 MG PO TABS: 25.0000 mg | ORAL_TABLET | Freq: Every day | ORAL | 3 refills | Status: DC

## 2020-08-04 NOTE — Telephone Encounter (Signed)
Will send new rxs for separate meds.

## 2020-08-06 ENCOUNTER — Telehealth: Payer: Self-pay

## 2020-08-06 MED ORDER — VALSARTAN-HYDROCHLOROTHIAZIDE 320-25 MG PO TABS: 1.0000 | ORAL_TABLET | Freq: Every day | ORAL | 3 refills | Status: DC

## 2020-08-06 NOTE — Telephone Encounter (Signed)
Spoke to pt to let him know we have to change his losartan-hctz to valsaratn-hctz due to manufacturer back order.

## 2020-08-20 ENCOUNTER — Other Ambulatory Visit: Payer: Self-pay

## 2020-08-20 ENCOUNTER — Encounter: Payer: Self-pay | Admitting: Podiatry

## 2020-08-20 ENCOUNTER — Ambulatory Visit (INDEPENDENT_AMBULATORY_CARE_PROVIDER_SITE_OTHER): Payer: Medicare Other | Admitting: Podiatry

## 2020-08-20 DIAGNOSIS — L853 Xerosis cutis: Secondary | ICD-10-CM | POA: Diagnosis not present

## 2020-08-20 DIAGNOSIS — M79674 Pain in right toe(s): Secondary | ICD-10-CM | POA: Diagnosis not present

## 2020-08-20 DIAGNOSIS — M79675 Pain in left toe(s): Secondary | ICD-10-CM | POA: Diagnosis not present

## 2020-08-20 DIAGNOSIS — B351 Tinea unguium: Secondary | ICD-10-CM

## 2020-08-20 MED ORDER — AMMONIUM LACTATE 12 % EX LOTN: 1.0000 "application " | TOPICAL_LOTION | CUTANEOUS | 1 refills | Status: AC | PRN

## 2020-08-20 NOTE — Progress Notes (Signed)
Subjective: Ricardo Reese is a 70 y.o.  male returns to office today for complaint of left 4th interdigital space maceration as well as thickened elongated dystrophic toenails x10.  He would like to have them debrided down.  He states that there is pain when ambulating.  He states that he has secondary complaint of dry skin with fissuring cracking.  It is mildly painful to touch and has improved a little bit he would like to know if there is any, prescription lotion to help with this. Objective:  Vitals: Reviewed  General: Well developed, nourished, in no acute distress, alert and oriented x3   Dermatology: Skin is warm, dry and supple bilateral.  Interdigital maceration noted without any underlying wound to the left 4th and 5th digit interdigital space.  No clinical signs of infection noted. Nail Exam: Pt has thick disfigured discolored nails with subungual debris noted bilateral entire nail hallux through fifth toenails.  Pain on palpation to the nails.  Neurovascular status: Intact. No lower extremity swelling; No pain with calf compression bilateral.  Musculoskeletal: Decreased tenderness to palpation of the Medial hallux nail fold(s). Muscular strength within normal limits bilateral.   Assesement and Plan:  Xerosis right heel with fissure and cracked -I explained to the patient the etiology of xerosis and various treatment options were extensively discussed.  I explained to the patient the importance of maintaining moisturization of the skin with application of over-the-counter lotion such as Eucerin or Luciderm.  Given that he has failed over-the-counter lotion treatment options.  I believe patient will benefit of prescription lotion.  Ammonium lactate was sent to the pharmacy.   Interdigital maceration 4th and 5th digit on the left -I explained to the patient the etiology of maceration various treatment options were discussed.  I told him that given that he is a diabetic he should be  careful and keep it dry between the toes.  I discussed with him to Betadine paint in between the toes over the next few weeks to allow it to dry up.  He states understanding and will do so.  Onychomycosis with pain  -Nails palliatively debrided as below. -Educated on self-care  Procedure: Nail Debridement Rationale: pain  Type of Debridement: manual, sharp debridement. Instrumentation: Nail nipper, rotary burr. Number of Nails: 10  Procedures and Treatment: Consent by patient was obtained for treatment procedures. The patient understood the discussion of treatment and procedures well. All questions were answered thoroughly reviewed. Debridement of mycotic and hypertrophic toenails, 1 through 5 bilateral and clearing of subungual debris. No ulceration, no infection noted.  Return Visit-Office Procedure: Patient instructed to return to the office for a follow up visit 3 months for continued evaluation and treatment.  Boneta Lucks, DPM    No follow-ups on file.   Boneta Lucks, DPM

## 2020-09-09 DIAGNOSIS — E119 Type 2 diabetes mellitus without complications: Secondary | ICD-10-CM | POA: Diagnosis not present

## 2020-09-09 LAB — HM DIABETES EYE EXAM

## 2020-10-10 ENCOUNTER — Other Ambulatory Visit: Payer: Self-pay | Admitting: Internal Medicine

## 2020-10-22 ENCOUNTER — Other Ambulatory Visit: Payer: Self-pay | Admitting: Internal Medicine

## 2020-10-28 ENCOUNTER — Ambulatory Visit: Payer: Medicare Other | Admitting: Internal Medicine

## 2020-11-04 ENCOUNTER — Ambulatory Visit: Payer: Medicare Other | Admitting: Internal Medicine

## 2020-12-05 ENCOUNTER — Other Ambulatory Visit: Payer: Self-pay | Admitting: Internal Medicine

## 2020-12-15 ENCOUNTER — Ambulatory Visit: Payer: Medicare Other | Admitting: Internal Medicine

## 2020-12-22 ENCOUNTER — Encounter: Payer: Self-pay | Admitting: Podiatry

## 2020-12-22 ENCOUNTER — Other Ambulatory Visit: Payer: Self-pay

## 2020-12-22 ENCOUNTER — Ambulatory Visit (INDEPENDENT_AMBULATORY_CARE_PROVIDER_SITE_OTHER): Payer: Medicare Other | Admitting: Podiatry

## 2020-12-22 DIAGNOSIS — M79675 Pain in left toe(s): Secondary | ICD-10-CM

## 2020-12-22 DIAGNOSIS — L853 Xerosis cutis: Secondary | ICD-10-CM

## 2020-12-22 DIAGNOSIS — M79674 Pain in right toe(s): Secondary | ICD-10-CM | POA: Diagnosis not present

## 2020-12-22 DIAGNOSIS — B351 Tinea unguium: Secondary | ICD-10-CM

## 2020-12-22 NOTE — Progress Notes (Signed)
Subjective: Ricardo Reese is a 70 y.o.  male returns to office today for thickened elongated dystrophic toenails x10.  Painful to touch.  Patient would like to have them debrided his not able to do it himself.  He states that his dry skin is doing much better.  It is no longer fissuring he denies any other acute complaints.. Objective:  Vitals: Reviewed  General: Well developed, nourished, in no acute distress, alert and oriented x3   Dermatology: Skin is warm, dry and supple bilateral.  Interdigital maceration noted without any underlying wound to the left 4th and 5th digit interdigital space.  No clinical signs of infection noted. Nail Exam: Pt has thick disfigured discolored nails with subungual debris noted bilateral entire nail hallux through fifth toenails.  Pain on palpation to the nails.  Neurovascular status: Intact. No lower extremity swelling; No pain with calf compression bilateral.  Musculoskeletal: Decreased tenderness to palpation of the Medial hallux nail fold(s). Muscular strength within normal limits bilateral.   Assesement and Plan:  Xerosis right heel with fissure and cracked -Clinically healed with ammonium lactate.  No further xerosis or skin fissures noted.   Interdigital maceration 4th and 5th digit on the left -I explained to the patient the etiology of maceration various treatment options were discussed.  I told him that given that he is a diabetic he should be careful and keep it dry between the toes.  I discussed with him to Betadine paint in between the toes over the next few weeks to allow it to dry up.  He states understanding and will do so.  Onychomycosis with pain  -Nails palliatively debrided as below. -Educated on self-care  Procedure: Nail Debridement Rationale: pain  Type of Debridement: manual, sharp debridement. Instrumentation: Nail nipper, rotary burr. Number of Nails: 10  Procedures and Treatment: Consent by patient was obtained for  treatment procedures. The patient understood the discussion of treatment and procedures well. All questions were answered thoroughly reviewed. Debridement of mycotic and hypertrophic toenails, 1 through 5 bilateral and clearing of subungual debris. No ulceration, no infection noted.  Return Visit-Office Procedure: Patient instructed to return to the office for a follow up visit 3 months for continued evaluation and treatment.  Boneta Lucks, DPM    No follow-ups on file.   Boneta Lucks, DPM

## 2020-12-23 ENCOUNTER — Encounter: Payer: Self-pay | Admitting: Internal Medicine

## 2020-12-23 ENCOUNTER — Ambulatory Visit (INDEPENDENT_AMBULATORY_CARE_PROVIDER_SITE_OTHER): Payer: Medicare Other | Admitting: Internal Medicine

## 2020-12-23 VITALS — BP 114/62 | HR 58 | Temp 97.4°F | Ht 69.5 in | Wt 203.0 lb

## 2020-12-23 DIAGNOSIS — I10 Essential (primary) hypertension: Secondary | ICD-10-CM

## 2020-12-23 DIAGNOSIS — Z0000 Encounter for general adult medical examination without abnormal findings: Secondary | ICD-10-CM | POA: Diagnosis not present

## 2020-12-23 DIAGNOSIS — E1159 Type 2 diabetes mellitus with other circulatory complications: Secondary | ICD-10-CM | POA: Diagnosis not present

## 2020-12-23 DIAGNOSIS — Z8546 Personal history of malignant neoplasm of prostate: Secondary | ICD-10-CM | POA: Diagnosis not present

## 2020-12-23 LAB — COMPREHENSIVE METABOLIC PANEL
ALT: 16 U/L (ref 0–53)
AST: 18 U/L (ref 0–37)
Albumin: 4.7 g/dL (ref 3.5–5.2)
Alkaline Phosphatase: 51 U/L (ref 39–117)
BUN: 23 mg/dL (ref 6–23)
CO2: 28 mEq/L (ref 19–32)
Calcium: 9.8 mg/dL (ref 8.4–10.5)
Chloride: 103 mEq/L (ref 96–112)
Creatinine, Ser: 0.89 mg/dL (ref 0.40–1.50)
GFR: 87.43 mL/min (ref >60.00)
Glucose, Bld: 118 mg/dL — ABNORMAL HIGH (ref 70–99)
Potassium: 4.5 mEq/L (ref 3.5–5.1)
Sodium: 138 mEq/L (ref 135–145)
Total Bilirubin: 0.5 mg/dL (ref 0.2–1.2)
Total Protein: 7.3 g/dL (ref 6.0–8.3)

## 2020-12-23 LAB — CBC
HCT: 40.3 % (ref 39.0–52.0)
Hemoglobin: 13.7 g/dL (ref 13.0–17.0)
MCHC: 34 g/dL (ref 30.0–36.0)
MCV: 88.4 fl (ref 78.0–100.0)
Platelets: 249 10*3/uL (ref 150.0–400.0)
RBC: 4.56 Mil/uL (ref 4.22–5.81)
RDW: 14.8 % (ref 11.5–15.5)
WBC: 4.5 10*3/uL (ref 4.0–10.5)

## 2020-12-23 LAB — LIPID PANEL
Cholesterol: 187 mg/dL (ref 0–200)
HDL: 64.6 mg/dL (ref >39.00)
LDL Cholesterol: 97 mg/dL (ref 0–99)
NonHDL: 122.03
Total CHOL/HDL Ratio: 3
Triglycerides: 125 mg/dL (ref 0.0–149.0)
VLDL: 25 mg/dL (ref 0.0–40.0)

## 2020-12-23 LAB — HM DIABETES FOOT EXAM

## 2020-12-23 LAB — HEMOGLOBIN A1C: Hgb A1c MFr Bld: 6.3 % (ref 4.6–6.5)

## 2020-12-23 NOTE — Assessment & Plan Note (Signed)
I have personally reviewed the Medicare Annual Wellness questionnaire and have noted 1. The patient's medical and social history 2. Their use of alcohol, tobacco or illicit drugs 3. Their current medications and supplements 4. The patient's functional ability including ADL's, fall risks, home safety risks and hearing or visual             impairment. 5. Diet and physical activities 6. Evidence for depression or mood disorders  The patients weight, height, BMI and visual acuity have been recorded in the chart I have made referrals, counseling and provided education to the patient based review of the above and I have provided the pt with a written personalized care plan for preventive services.  I have provided you with a copy of your personalized plan for preventive services. Please take the time to review along with your updated medication list.  Colon due next year PSA yearly due to prostate cancer COVID second booster later this year Flu vaccine in fall Td at pharmacy Consider shingrix at pharmacy

## 2020-12-23 NOTE — Progress Notes (Signed)
Subjective:    Patient ID: Ricardo Reese, male    DOB: 05-29-1951, 70 y.o.   MRN: [id number]  HPI Here for Medicare wellness visit and follow up of chronic health conditions This visit occurred during the SARS-CoV-2 public health emergency.  Safety protocols were in place, including screening questions prior to the visit, additional usage of staff PPE, and extensive cleaning of exam room while observing appropriate contact time as indicated for disinfecting solutions.   Reviewed form and advanced directives Reviewed other doctors Enjoys beer at times No tobacco Walks some, plays golf (rides cart---hills tough on his knees) Vision and hearing are fine No falss No depression or anhedonia Independent with instrumental ADLs No memory problems Works part time as starter at [employer] sugars occasionally Usually ~100 No low sugar reactions  Sees urologist yearly PSA stays low  No chest pain or SOB No dizziness or syncope No edema No palpitations  Current Outpatient Medications on File Prior to Visit  Medication Sig Dispense Refill   amLODipine (NORVASC) 5 MG tablet TAKE 1 TABLET BY MOUTH EVERY DAY 90 tablet 0   ammonium lactate (AMLACTIN) 12 % lotion Apply 1 application topically as needed for dry skin. 400 g 1   B Complex-C (SUPER B COMPLEX PO) Take 1 tablet by mouth daily.      fish oil-omega-3 fatty acids 1000 MG capsule Take 1 g by mouth daily.     glucose blood (CONTOUR NEXT TEST) test strip Use to obtain blood sugar once a day. Dx Code E11.9 100 each 3   metFORMIN (GLUCOPHAGE) 500 MG tablet TAKE 1 TABLET (500 MG TOTAL) BY MOUTH 2 (TWO) TIMES DAILY WITH A MEAL. 180 tablet 3   pravastatin (PRAVACHOL) 40 MG tablet TAKE 1 TABLET BY MOUTH EVERY DAY 90 tablet 0   sildenafil (REVATIO) 20 MG tablet Take 3-5 tablets (60-100 mg total) by mouth daily as needed. 50 tablet 11   valsartan-hydrochlorothiazide (DIOVAN-HCT) 320-25 MG tablet Take 1 tablet by mouth daily. 90  tablet 3   No current facility-administered medications on file prior to visit.    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    Past Surgical History:  Procedure Laterality Date   KNEE SURGERY  1969   cartilage removal right knee    melanoma back  09/2010   PROSTATE BIOPSY     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   Procedure: LEFT TOTAL HIP ARTHROPLASTY ANTERIOR APPROACH;  Surgeon: Mcarthur Rossetti, MD;  Location: [employer];  Service: Orthopedics;  Laterality: Left;    Family History  Problem Relation Age of Onset   Stroke Father    Lung cancer Father    Prostate cancer Brother    Heart disease Brother    Diabetes Neg Hx    Colon cancer Neg Hx    Stomach cancer Neg Hx    Esophageal cancer Neg Hx    Rectal  cancer Neg Hx     Social History   Socioeconomic History   Marital status: Married    Spouse name: [name]   Number of children: 2   Years of education: Not on file   Highest education level: Not on file  Occupational History   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  Tobacco Use   Smoking status: Never   Smokeless tobacco: Never  Vaping Use   Vaping Use: Never used  Substance and Sexual Activity   Alcohol use: Yes    Alcohol/week: 3.0 - 4.0 standard drinks    Types: 3 - 4 Cans of beer per week    Comment: occasional beer   Drug use: No   Sexual activity: Yes  Other Topics Concern   Not on file  Social History Narrative   Has living will   Wife is health care POA--- daughters next   Would  accept resuscitation   No tube feeds if cognitively unaware   Social Determinants of Health   Financial Resource Strain: Not on file  Food Insecurity: Not on file  Transportation Needs: Not on file  Physical Activity: Not on file  Stress: Not on file  Social Connections: Not on file  Intimate Partner Violence: Not on file   Review of Systems Appetite is good Weight down 10# in past year Mostly sleeps well Wears seat belt Teeth are fine--keeps up with dentist Going to derm next week--nothing suspicious  No heartburn or dysphagia Bowels are fine---no blood Voids okay. Rare nocturia. Stream is fine Some hand stiffness. Only other joint issue is right knee    Objective:   Physical Exam Constitutional:      Appearance: Normal appearance.  HENT:     Mouth/Throat:     Comments: No lesions Eyes:     Conjunctiva/sclera: Conjunctivae normal.     Pupils: Pupils are equal, round, and reactive to light.  Cardiovascular:     Rate and Rhythm: Normal rate and regular rhythm.     Pulses: Normal pulses.     Heart sounds: No murmur heard.   No gallop.  Pulmonary:     Effort: Pulmonary effort is normal.     Breath sounds: Normal breath sounds. No wheezing or rales.  Abdominal:     Palpations: Abdomen is soft.     Tenderness: There is no abdominal tenderness.  Musculoskeletal:     Cervical back: Neck supple.     Right lower leg: No edema.     Left lower leg: No edema.  Lymphadenopathy:     Cervical: No cervical adenopathy.  Skin:    General: Skin is warm.     Findings: No rash.     Comments: No foot lesions  Neurological:     Mental Status: He is alert and oriented to person, place, and time.     Comments: President---"Joe Balinda Quails Obama" [phone] [id number] Recall 2/3  Normal sensation in feet  Psychiatric:        Mood and Affect: Mood normal.        Behavior: Behavior normal.           Assessment & Plan:

## 2020-12-23 NOTE — Progress Notes (Signed)
Hearing Screening  Method: Audiometry   500Hz  1000Hz  2000Hz  4000Hz   Right ear 20 20 20 20   Left ear 20 20 20 20   Vision Screening - Comments:: March 2022

## 2020-12-23 NOTE — Assessment & Plan Note (Signed)
Seems to still have good control on the metformin Is on statin Will check labs

## 2020-12-23 NOTE — Assessment & Plan Note (Signed)
Controlled with valsartan/HCTZ and amlodipine

## 2020-12-23 NOTE — Assessment & Plan Note (Signed)
Yearly visit with Dr Junious Silk

## 2020-12-28 DIAGNOSIS — D2271 Melanocytic nevi of right lower limb, including hip: Secondary | ICD-10-CM | POA: Diagnosis not present

## 2020-12-28 DIAGNOSIS — L570 Actinic keratosis: Secondary | ICD-10-CM | POA: Diagnosis not present

## 2020-12-28 DIAGNOSIS — D2261 Melanocytic nevi of right upper limb, including shoulder: Secondary | ICD-10-CM | POA: Diagnosis not present

## 2020-12-28 DIAGNOSIS — X32XXXA Exposure to sunlight, initial encounter: Secondary | ICD-10-CM | POA: Diagnosis not present

## 2020-12-28 DIAGNOSIS — Z86006 Personal history of melanoma in-situ: Secondary | ICD-10-CM | POA: Diagnosis not present

## 2020-12-28 DIAGNOSIS — Z85820 Personal history of malignant melanoma of skin: Secondary | ICD-10-CM | POA: Diagnosis not present

## 2020-12-29 ENCOUNTER — Ambulatory Visit: Payer: Medicare Other

## 2021-01-05 ENCOUNTER — Ambulatory Visit: Payer: Medicare Other

## 2021-01-08 ENCOUNTER — Telehealth: Payer: Self-pay | Admitting: Internal Medicine

## 2021-01-08 NOTE — Progress Notes (Signed)
  Chronic Care Management   Note  01/08/2021 Name: Ricardo Reese MRN: [id number] DOB: Nov 06, 1950  Ricardo Reese is a 70 y.o. year old male who is a primary care patient of Venia Carbon, MD. I reached out to Dazey by phone today in response to a referral sent by Ricardo Reese's PCP, Venia Carbon, MD.   Ricardo Reese was given information about Chronic Care Management services today including:  CCM service includes personalized support from designated clinical staff supervised by his physician, including individualized plan of care and coordination with other care providers 24/7 contact phone numbers for assistance for urgent and routine care needs. Service will only be billed when office clinical staff spend 20 minutes or more in a month to coordinate care. Only one practitioner may furnish and bill the service in a calendar month. The patient may stop CCM services at any time (effective at the end of the month) by phone call to the office staff.   Patient agreed to services and verbal consent obtained.   Follow up plan:   Tatjana Secretary/administrator

## 2021-01-08 NOTE — Chronic Care Management (AMB) (Signed)
ERROR

## 2021-01-12 ENCOUNTER — Other Ambulatory Visit: Payer: Self-pay | Admitting: Internal Medicine

## 2021-01-26 ENCOUNTER — Telehealth: Payer: Self-pay | Admitting: Pharmacist

## 2021-01-26 NOTE — Progress Notes (Signed)
error 

## 2021-01-26 NOTE — Progress Notes (Addendum)
    Chronic Care Management Pharmacy Assistant   Name: Ricardo Reese  MRN: [id number] DOB: Nov 10, 1950  Reason for Encounter: Initial Questions Appointment: OV 02/03/21 @ 9:000 am  Recent office visits:  12/23/20 Silvio Pate (PCP) - Medicare Wellness exam, Type 2 Diabetes. No med changes. Foot exam. F/u 6 mos.  Recent consult visits:  12/22/20 Posey Pronto (Podiatry) - Xerosis of skin. No med changes.  08/20/20 Patel (Podiatry) - Xerosis of skin. Start Ammonium Lactate 12 %.  Hospital visits:  None in previous 6 months  Medications: Outpatient Encounter Medications as of 01/26/2021  Medication Sig   amLODipine (NORVASC) 5 MG tablet TAKE 1 TABLET BY MOUTH EVERY DAY   ammonium lactate (AMLACTIN) 12 % lotion Apply 1 application topically as needed for dry skin.   B Complex-C (SUPER B COMPLEX PO) Take 1 tablet by mouth daily.    fish oil-omega-3 fatty acids 1000 MG capsule Take 1 g by mouth daily.   glucose blood (CONTOUR NEXT TEST) test strip Use to obtain blood sugar once a day. Dx Code E11.9   metFORMIN (GLUCOPHAGE) 500 MG tablet TAKE 1 TABLET (500 MG TOTAL) BY MOUTH 2 (TWO) TIMES DAILY WITH A MEAL.   pravastatin (PRAVACHOL) 40 MG tablet TAKE 1 TABLET BY MOUTH EVERY DAY   sildenafil (REVATIO) 20 MG tablet Take 3-5 tablets (60-100 mg total) by mouth daily as needed.   valsartan-hydrochlorothiazide (DIOVAN-HCT) 320-25 MG tablet Take 1 tablet by mouth daily.   No facility-administered encounter medications on file as of 01/26/2021.    Have you seen any other providers since your last visit?  Patient wife states he seen his Dermatologist & Podiatrists.  Any changes in your medications or health?  Patient wife states no recent changes.  Any side effects from any medications?  Patient wife states no side effects.  Do you have an symptoms or problems not managed by your medications?  Patient wife states not at this time.  Any concerns about your health right now?  Patient wife states she think  its time for his meds to be re-evaluated, when he goes to his daughter house he has been forgetful of which doors to go.   Has your provider asked that you check blood pressure, blood sugar, or follow special diet at home?  Patient wife states he does not check his BP or glucose at home nor follow a special diet.  Do you get any type of exercise on a regular basis?  Patient wife states he is very active and plays golf at least 5 times a week.  Can you think of a goal you would like to reach for your health?  Patient wife states not at this time.  Do you have any problems getting your medications?  Patient wife states no problems getting medication.  Is there anything that you would like to discuss during the appointment?  Patient would like to learn more about [facility] packaging.  Please bring medications and supplements to appointment.  Star Rating Drugs: Metformin - last day 12/07/20 90D Valsartan-Hctz - last day 11/04/20 90D Pravastatin - last day fill 10/22/20 [place], RMA Clinical Pharmacists Assistant [phone]  Time Spent: 68

## 2021-01-28 ENCOUNTER — Other Ambulatory Visit: Payer: Self-pay | Admitting: Internal Medicine

## 2021-02-03 ENCOUNTER — Ambulatory Visit (INDEPENDENT_AMBULATORY_CARE_PROVIDER_SITE_OTHER): Payer: Medicare Other | Admitting: Pharmacist

## 2021-02-03 ENCOUNTER — Other Ambulatory Visit: Payer: Self-pay

## 2021-02-03 DIAGNOSIS — I10 Essential (primary) hypertension: Secondary | ICD-10-CM

## 2021-02-03 DIAGNOSIS — E1159 Type 2 diabetes mellitus with other circulatory complications: Secondary | ICD-10-CM

## 2021-02-03 DIAGNOSIS — E785 Hyperlipidemia, unspecified: Secondary | ICD-10-CM

## 2021-02-03 NOTE — Progress Notes (Signed)
Chronic Care Management Pharmacy Note  02/03/2021 Name:  KIREN MCISAAC MRN:  [id number] DOB:  07-29-1950  Summary: -Pt reports feeling more fatigued lately, he is concerned about blood pressure medication side effects  Recommendations/Changes made from today's visit: -Advised to monitor BP at home daily for next few weeks; may be able to d/c amlodipine if BP is consistently low-normal   Subjective: Trebor Galdamez Hokenson is an 70 y.o. year old male who is a primary patient of [place], Theophilus Kinds, MD.  The CCM team was consulted for assistance with disease management and care coordination needs.    Engaged with patient face to face for initial visit in response to provider referral for pharmacy case management and/or care coordination services.   Consent to Services:  The patient was given the following information about Chronic Care Management services today, agreed to services, and gave verbal consent: 1. CCM service includes personalized support from designated clinical staff supervised by the primary care provider, including individualized plan of care and coordination with other care providers 2. 24/7 contact phone numbers for assistance for urgent and routine care needs. 3. Service will only be billed when office clinical staff spend 20 minutes or more in a month to coordinate care. 4. Only one practitioner may furnish and bill the service in a calendar month. 5.The patient may stop CCM services at any time (effective at the end of the month) by phone call to the office staff. 6. The patient will be responsible for cost sharing (co-pay) of up to 20% of the service fee (after annual deductible is met). Patient agreed to services and consent obtained.  Patient Care Team: Venia Carbon, MD as PCP - General Arah Aro, Cleaster Corin, [facility] as Pharmacist (Pharmacist)   Patient is from [place] area, he worked for [employer] for 45 years and moved 12 times for his job. He is now retired and living with  his wife in [place]. They have 2 daughters, 1 living in [place] area, 1 in [place]. Patient works/volunteers at [employer] in [place] a few days a week.  Recent office visits: 12/23/20 Silvio Pate (PCP) - Medicare Wellness exam, Type 2 Diabetes. No med changes. Foot exam. F/u 6 mos.  Recent consult visits: 12/28/20 Dr Evorn Gong (dermatology): f/u skin cancer 12/22/20 Posey Pronto (Podiatry) - Xerosis of skin. No med changes.   08/20/20 Patel (Podiatry) - Xerosis of skin. Start Ammonium Lactate 12 %.  Hospital visits: None in previous 6 months   Objective:  Lab Results  Component Value Date   CREATININE 0.89 12/23/2020   BUN 23 12/23/2020   GFR 87.43 12/23/2020   GFRNONAA >60 09/21/2016   GFRAA >60 09/21/2016   NA 138 12/23/2020   K 4.5 12/23/2020   CALCIUM 9.8 12/23/2020   CO2 28 12/23/2020   GLUCOSE 118 (H) 12/23/2020    Lab Results  Component Value Date/Time   HGBA1C 6.3 12/23/2020 09:15 AM   HGBA1C 6.1 (A) 04/28/2020 07:39 AM   HGBA1C 6.3 10/25/2019 08:11 AM   HGBA1C 6.0 05/04/2018 08:08 AM   HGBA1C 6.0 05/04/2018 08:08 AM   HGBA1C 6.0 05/04/2018 08:08 AM   GFR 87.43 12/23/2020 09:15 AM   GFR 98.88 10/25/2019 08:11 AM    Last diabetic Eye exam:  Lab Results  Component Value Date/Time   HMDIABEYEEXA No Retinopathy 09/09/2020 12:00 AM    Last diabetic Foot exam:  Lab Results  Component Value Date/Time   HMDIABFOOTEX done 12/23/2020 12:00 AM  Lab Results  Component Value Date   CHOL 187 12/23/2020   HDL 64.60 12/23/2020   LDLCALC 97 12/23/2020   LDLDIRECT 191.2 08/17/2011   TRIG 125.0 12/23/2020   CHOLHDL 3 12/23/2020    Hepatic Function Latest Ref Rng & Units 12/23/2020 10/25/2019 10/29/2018  Total Protein 6.0 - 8.3 g/dL 7.3 7.0 6.8  Albumin 3.5 - 5.2 g/dL 4.7 4.4 4.5  AST 0 - 37 U/L '18 18 17  ' ALT 0 - 53 U/L '16 17 18  ' Alk Phosphatase 39 - 117 U/L 51 58 56  Total Bilirubin 0.2 - 1.2 mg/dL 0.5 0.5 0.6  Bilirubin, Direct 0.0 - 0.3 mg/dL - - -     Lab Results  Component Value Date/Time   TSH 0.78 01/04/2013 12:18 PM   TSH 0.66 08/17/2011 12:27 PM   FREET4 0.60 10/04/2017 09:48 AM   FREET4 0.68 03/26/2014 11:29 AM    CBC Latest Ref Rng & Units 12/23/2020 10/25/2019 10/29/2018  WBC 4.0 - 10.5 K/uL 4.5 4.4 4.3  Hemoglobin 13.0 - 17.0 g/dL 13.7 13.6 13.0  Hematocrit 39.0 - 52.0 % 40.3 39.5 37.5(L)  Platelets 150.0 - 400.0 K/uL 249.0 228.0 240.0    No results found for: VD25OH  Clinical ASCVD: No  The 10-year ASCVD risk score Mikey Bussing DC Jr., et al., 2013) is: 25.1%   Values used to calculate the score:     Age: 89 years     Sex: Male     Is Non-Hispanic African American: No     Diabetic: Yes     Tobacco smoker: No     Systolic Blood Pressure: 827 mmHg     Is BP treated: Yes     HDL Cholesterol: 64.6 mg/dL     Total Cholesterol: 187 mg/dL    Depression screen [facility] 2/9 12/23/2020 10/25/2019 10/25/2019  Decreased Interest 0 0 0  Down, Depressed, Hopeless 0 0 0  PHQ - 2 Score 0 0 0    Social History   Tobacco Use  Smoking Status Never  Smokeless Tobacco Never   BP Readings from Last 3 Encounters:  12/23/20 114/62  04/28/20 124/74  03/20/20 (!) 188/96   Pulse Readings from Last 3 Encounters:  12/23/20 (!) 58  04/28/20 65  03/20/20 67   Wt Readings from Last 3 Encounters:  12/23/20 203 lb (92.1 kg)  04/28/20 208 lb (94.3 kg)  10/25/19 213 lb 4 oz (96.7 kg)   BMI Readings from Last 3 Encounters:  12/23/20 29.55 kg/m  04/28/20 29.84 kg/m  10/25/19 31.04 kg/m    Assessment/Interventions: Review of patient past medical history, allergies, medications, health status, including review of consultants reports, laboratory and other test data, was performed as part of comprehensive evaluation and provision of chronic care management services.   SDOH:  (Social Determinants of Health) assessments and interventions performed: Yes SDOH Interventions    Flowsheet Row Most Recent Value  SDOH Interventions   Financial  Strain Interventions Intervention Not Indicated      SDOH Screenings   Alcohol Screen: Not on file  Depression (PHQ2-9): Low Risk    PHQ-2 Score: 0  Financial Resource Strain: Low Risk    Difficulty of Paying Living Expenses: Not hard at all  Food Insecurity: Not on file  Housing: Not on file  Physical Activity: Not on file  Social Connections: Not on file  Stress: Not on file  Tobacco Use: Low Risk    Smoking Tobacco Use: Never   Smokeless Tobacco Use: Never  Transportation  Needs: Not on file    CCM Care Plan  Allergies  Allergen Reactions   No Known Allergies     Medications Reviewed Today     Reviewed by Charlton Haws, [facility] (Pharmacist) on 02/03/21 at [phone]  Med List Status: <None>   Medication Order Taking? Sig Documenting Provider Last Dose Status Informant  amLODipine (NORVASC) 5 MG tablet [id number] Yes TAKE 1 TABLET BY MOUTH EVERY DAY Venia Carbon, MD Taking Active   ammonium lactate (AMLACTIN) 12 % lotion [id number] Yes Apply 1 application topically as needed for dry skin. Felipa Furnace, DPM Taking Active   B Complex-C (SUPER B COMPLEX PO) [id number] Yes Take 1 tablet by mouth daily.  [provider] Taking Active Self  cholecalciferol (VITAMIN D3) 25 MCG (1000 UNIT) tablet [id number] Yes Take 1,000 Units by mouth daily. [provider] Taking Active   fish oil-omega-3 fatty acids 1000 MG capsule [id number] Yes Take 1 g by mouth daily. [provider] Taking Active Self  glucose blood (CONTOUR NEXT TEST) test strip [id number] Yes Use to obtain blood sugar once a day. Dx Code E11.9 Venia Carbon, MD Taking Active   metFORMIN (GLUCOPHAGE) 500 MG tablet [id number] Yes TAKE 1 TABLET (500 MG TOTAL) BY MOUTH 2 (TWO) TIMES DAILY WITH A MEAL. Venia Carbon, MD Taking Active   pravastatin (PRAVACHOL) 40 MG tablet [id number] Yes TAKE 1 TABLET BY MOUTH EVERY DAY Venia Carbon, MD Taking Active   sildenafil (REVATIO) 20 MG tablet  [id number] Yes Take 3-5 tablets (60-100 mg total) by mouth daily as needed. Venia Carbon, MD Taking Active   valsartan-hydrochlorothiazide (DIOVAN-HCT) 320-25 MG tablet [id number] Yes Take 1 tablet by mouth daily. Venia Carbon, MD Taking Active             Patient Active Problem List   Diagnosis Date Noted   Aortic heart murmur 04/28/2020   Right knee pain 10/25/2019   History of prostate cancer 01/02/2018   Advance directive discussed with patient 09/26/2016   Unilateral primary osteoarthritis, left hip 09/20/2016   Status post left hip replacement 09/20/2016   Routine general medical examination at a health care facility 08/17/2011   History of melanoma    Hyperlipemia 05/20/2008   Benign essential hypertension 05/20/2008   Type 2 diabetes mellitus with other circulatory complications ([place]) 75/44/9201    Immunization History  Administered Date(s) Administered   Fluad Quad(high Dose 65+) 04/03/2019, 03/19/2020   Influenza Split 02/13/2013   Influenza, Seasonal, Injecte, Preservative Fre 03/14/2015   Influenza,inj,Quad PF,6+ Mos 02/26/2018, 04/01/2018   PFIZER(Purple Top)SARS-COV-2 Vaccination 07/05/2019, 07/24/2019, 03/28/2020   Pneumococcal Conjugate-13 10/04/2017   Pneumococcal Polysaccharide-23 10/29/2018   Td 05/20/2008   Zoster, Live 06/13/2012    Conditions to be addressed/monitored:  Hypertension, Hyperlipidemia, and Diabetes  Care Plan : [place]  Updates made by Charlton Haws, [place] since 02/03/2021 12:00 AM     Problem: Hypertension, Hyperlipidemia, and Diabetes   Priority: High     Long-Range Goal: Disease management   Start Date: 02/03/2021  Expected End Date: 02/03/2022  This Visit's Progress: On track  Priority: High  Note:   Current Barriers:  Unable to independently monitor therapeutic efficacy  Pharmacist Clinical Goal(s):  Patient will achieve adherence to monitoring guidelines and medication adherence to achieve  therapeutic efficacy through collaboration with PharmD and provider.   Interventions: 1:1 collaboration with Venia Carbon, MD regarding development and update of comprehensive plan of care as evidenced  by provider attestation and co-signature Inter-disciplinary care team collaboration (see longitudinal plan of care) Comprehensive medication review performed; medication list updated in electronic medical record  Hypertension (BP goal <130/80) -Controlled - BP is at goal in recent office visits; pt endorses 10 lb wt loss since November 2021 (50 lb wt loss overall since retiring); he is not checking BP at home but endorses some fatigue -Current treatment: Amlodipine 5 mg daily Valsartan-HCTZ 320-25 mg daily -Current exercise habits: golfing, walking -Educated on BP goals and benefits of medications for prevention of heart attack, stroke and kidney damage; Importance of home blood pressure monitoring; Symptoms of hypotension and importance of maintaining adequate hydration; -Counseled to monitor BP at home for next few weeks - call if BP consistently < 110/70, may consider d/c amlodipine if BP is low-normal -Recommended to continue current medication  Hyperlipidemia: (LDL goal < 100) -Controlled - LDL is at goal; pt endorses compliance; pt reports occasional leg cramps at night (although not in past ~3 weeks); he put a bar of soap under the sheets and it has helped; he reports cramps are tolerable now -Current treatment: Pravastatin 40 mg daily OTC Omega 3 Fish oil 1000 mg daily -Educated on Cholesterol goals; Benefits of statin for ASCVD risk reduction; Strategies to manage statin-induced myalgias - it is notable that pt has not had Vitamin D level checked, low Vitamin D can contribute to statin-induced myalgias -Counseled on other causes of leg cramps (exertion, electrolyte abnormalities) -Recommended to start OTC Vitamin D 1000 IU daily  Diabetes (A1c goal <7%) -Controlled - A1c is at  goal in prediabetic range; pt endorses compliance with metformin and denies issues; he is not checking BG -Current medications: Metformin 500 mg BID Testing supplies -Educated on A1c and blood sugar goals; benefits of metformin -Recommended to continue current medication  ? 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 more than true cognitive impairment; if he develops worsening memory issues, advised to make PCP appt  Health Maintenance -Vaccine gaps: Shingrix, TDAP (05/20/18), Covid booster (06/28/20) -Pt reports he did get Zostavax, has not had Shingrix; counseled on improved efficacy rate of Shingrix and advised to get it at local pharmacy -Pt reports he is waiting for new Covid vaccine to be available (Novavax) -Current therapy:  Super B Complex Ammonium lactate 12% lotion Sildenafil 20 mg 3-5 tab PRN -Patient is satisfied with current therapy and denies issues -Recommended to continue current medication  Patient Goals/Self-Care Activities Patient will:  - take medications as prescribed focus on medication adherence by routine check blood pressure daily for a few weeks target a minimum of 150 minutes of moderate intensity exercise weekly      Medication Assistance: None required.  Patient affirms current coverage meets needs.  Compliance/Adherence/Medication fill history: Care Gaps: Hep C screening Vaccines - Shingrix, TDAP, Covid booster  Star-Rating Drugs: Pravastatin - LF 10/22/20 x 90 ds Valsartan/HCTZ - LF 11/04/20 x 90 ds Metformin - LF 12/06/20 x 90 ds  Patient's preferred pharmacy is:  [id number] [place]Phone: [phone]: [phone]- [place] [address] [place] [address] Phone: [phone] Fax: [phone]  Uses pill box? No - prefers bottles Pt endorses 100% compliance  We discussed: Current pharmacy is preferred with insurance plan and patient is satisfied with pharmacy services Patient decided to: Continue current medication management strategy  Care Plan and Follow Up Patient Decision:  Patient agrees to Care Plan and Follow-up.  Plan: Telephone follow up appointment with care management team member scheduled for:  1 month  Charlene Brooke, PharmD, [place], CPP Clinical Pharmacist [facility] [phone]

## 2021-02-03 NOTE — Patient Instructions (Signed)
Visit Information  Phone number for Pharmacist: [phone]  Thank you for meeting with me to discuss your medications! I look forward to working with you to achieve your health care goals. Below is a summary of what we talked about during the visit:   Goals Addressed             This Visit's Progress    Track and Manage My Blood Pressure-Hypertension       Timeframe:  Long-Range Goal Priority:  Medium Start Date:       02/03/21                      Expected End Date:     02/03/22                  Follow Up Date Sept 2022   - check blood pressure daily - choose a place to take my blood pressure (home, clinic or office, retail store) - write blood pressure results in a log or diary    Why is this important?   You won't feel high blood pressure, but it can still hurt your blood vessels.  High blood pressure can cause heart or kidney problems. It can also cause a stroke.  Making lifestyle changes like losing a little weight or eating less salt will help.  Checking your blood pressure at home and at different times of the day can help to control blood pressure.  If the doctor prescribes medicine remember to take it the way the doctor ordered.  Call the office if you cannot afford the medicine or if there are questions about it.     Notes:         Care Plan : [place]  Updates made by Charlton Haws, RPH since 02/03/2021 12:00 AM     Problem: Hypertension, Hyperlipidemia, and Diabetes   Priority: High     Long-Range Goal: Disease management   Start Date: 02/03/2021  Expected End Date: 02/03/2022  This Visit's Progress: On track  Priority: High  Note:   Current Barriers:  Unable to independently monitor therapeutic efficacy  Pharmacist Clinical Goal(s):  Patient will achieve adherence to monitoring guidelines and medication adherence to achieve therapeutic efficacy through collaboration with PharmD and provider.   Interventions: 1:1 collaboration  with Venia Carbon, MD regarding development and update of comprehensive plan of care as evidenced by provider attestation and co-signature Inter-disciplinary care team collaboration (see longitudinal plan of care) Comprehensive medication review performed; medication list updated in electronic medical record  Hypertension (BP goal <130/80) -Controlled - BP is at goal in recent office visits; pt endorses 10 lb wt loss since November 2021 (50 lb wt loss overall since retiring); he is not checking BP at home but endorses some fatigue -Current treatment: Amlodipine 5 mg daily Valsartan-HCTZ 320-25 mg daily -Current exercise habits: golfing, walking -Educated on BP goals and benefits of medications for prevention of heart attack, stroke and kidney damage; Importance of home blood pressure monitoring; Symptoms of hypotension and importance of maintaining adequate hydration; -Counseled to monitor BP at home for next few weeks - call if BP consistently < 110/70, may consider d/c amlodipine if BP is low-normal -Recommended to continue current medication  Hyperlipidemia: (LDL goal < 100) -Controlled - LDL is at goal; pt endorses compliance; pt reports occasional leg cramps at night (although not in past ~3 weeks); he put a bar of soap under the sheets and it has helped;  he reports cramps are tolerable now -Current treatment: Pravastatin 40 mg daily OTC Omega 3 Fish oil 1000 mg daily -Educated on Cholesterol goals; Benefits of statin for ASCVD risk reduction; Strategies to manage statin-induced myalgias - it is notable that pt has not had Vitamin D level checked, low Vitamin D can contribute to statin-induced myalgias -Counseled on other causes of leg cramps (exertion, electrolyte abnormalities) -Recommended to start OTC Vitamin D 1000 IU daily  Diabetes (A1c goal <7%) -Controlled - A1c is at goal in prediabetic range; pt endorses compliance with metformin and denies issues; he is not checking  BG -Current medications: Metformin 500 mg BID Testing supplies -Educated on A1c and blood sugar goals; benefits of metformin -Recommended to continue current medication  ? 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 more than true cognitive impairment; if he develops worsening memory issues, advised to make PCP appt  Health Maintenance -Vaccine gaps: Shingrix, TDAP (05/20/18), Covid booster (06/28/20) -Pt reports he did get Zostavax, has not had Shingrix; counseled on improved efficacy rate of Shingrix and advised to get it at local pharmacy -Pt reports he is waiting for new Covid vaccine to be available (Novavax) -Current therapy:  Super B Complex Ammonium lactate 12% lotion Sildenafil 20 mg 3-5 tab PRN -Patient is satisfied with current therapy and denies issues -Recommended to continue current medication  Patient Goals/Self-Care Activities Patient will:  - take medications as prescribed focus on medication adherence by routine check blood pressure daily for a few weeks target a minimum of 150 minutes of moderate intensity exercise weekly      Mr. Griffo was given information about Chronic Care Management services today including:  CCM service includes personalized support from designated clinical staff supervised by his physician, including individualized plan of care and coordination with other care providers 24/7 contact phone numbers for assistance for urgent and routine care needs. Standard insurance, coinsurance, copays and deductibles apply for chronic care management only during months in which we provide at least 20 minutes of these services. Most  insurances cover these services at 100%, however patients may be responsible for any copay, coinsurance and/or deductible if applicable. This service may help you avoid the need for more expensive face-to-face services. Only one practitioner may furnish and bill the service in a calendar month. The patient may stop CCM services at any time (effective at the end of the month) by phone call to the office staff.  Patient agreed to services and verbal consent obtained.   Patient verbalizes understanding of instructions provided today and agrees to view in [place].  Telephone follow up appointment with pharmacy team member scheduled for: 1 month  Charlene Brooke, PharmD, Rafael Gonzalez, CPP Clinical Pharmacist [facility] at [facility] [phone]

## 2021-03-05 ENCOUNTER — Telehealth: Payer: Medicare Other

## 2021-03-08 ENCOUNTER — Ambulatory Visit: Payer: Medicare Other

## 2021-03-16 ENCOUNTER — Ambulatory Visit: Payer: Medicare Other | Admitting: Podiatry

## 2021-03-19 ENCOUNTER — Ambulatory Visit: Payer: Medicare Other

## 2021-03-19 DIAGNOSIS — Z23 Encounter for immunization: Secondary | ICD-10-CM | POA: Diagnosis not present

## 2021-03-22 ENCOUNTER — Telehealth: Payer: Self-pay | Admitting: Internal Medicine

## 2021-03-22 NOTE — Telephone Encounter (Signed)
Ricardo Reese from [place] called stating that pt came in to get the new Bivalent booster shot, but instead pt received the original Monovalent.Please advise.

## 2021-03-22 NOTE — Telephone Encounter (Signed)
Spoke to Moran, Software engineer. He will advise pt.

## 2021-03-30 ENCOUNTER — Other Ambulatory Visit: Payer: Self-pay

## 2021-03-30 ENCOUNTER — Ambulatory Visit (INDEPENDENT_AMBULATORY_CARE_PROVIDER_SITE_OTHER): Payer: Medicare Other | Admitting: Podiatry

## 2021-03-30 DIAGNOSIS — M79674 Pain in right toe(s): Secondary | ICD-10-CM

## 2021-03-30 DIAGNOSIS — M79675 Pain in left toe(s): Secondary | ICD-10-CM | POA: Diagnosis not present

## 2021-03-30 DIAGNOSIS — B351 Tinea unguium: Secondary | ICD-10-CM | POA: Diagnosis not present

## 2021-03-30 NOTE — Progress Notes (Signed)
Subjective: Ricardo Reese is a 70 y.o.  male returns to office today for thickened elongated dystrophic toenails x10.  Painful to touch.  Patient would like to have them debrided his not able to do it himself.  He denies any other acute complaints . Objective:  Vitals: Reviewed  General: Well developed, nourished, in no acute distress, alert and oriented x3   Dermatology: Skin is warm, dry and supple bilateral.  Interdigital maceration noted without any underlying wound to the left 4th and 5th digit interdigital space.  No clinical signs of infection noted. Nail Exam: Pt has thick disfigured discolored nails with subungual debris noted bilateral entire nail hallux through fifth toenails.  Pain on palpation to the nails.  Neurovascular status: Intact. No lower extremity swelling; No pain with calf compression bilateral.  Musculoskeletal: Decreased tenderness to palpation of the Medial hallux nail fold(s). Muscular strength within normal limits bilateral.   Assesement and Plan:  Xerosis right heel with fissure and cracked -Clinically healed with ammonium lactate.  No further xerosis or skin fissures noted.   Interdigital maceration 4th and 5th digit on the left -I explained to the patient the etiology of maceration various treatment options were discussed.  I told him that given that he is a diabetic he should be careful and keep it dry between the toes.  I discussed with him to Betadine paint in between the toes over the next few weeks to allow it to dry up.  He states understanding and will do so.  Onychomycosis with pain  -Nails palliatively debrided as below. -Educated on self-care  Procedure: Nail Debridement Rationale: pain  Type of Debridement: manual, sharp debridement. Instrumentation: Nail nipper, rotary burr. Number of Nails: 10  Procedures and Treatment: Consent by patient was obtained for treatment procedures. The patient understood the discussion of treatment and  procedures well. All questions were answered thoroughly reviewed. Debridement of mycotic and hypertrophic toenails, 1 through 5 bilateral and clearing of subungual debris. No ulceration, no infection noted.  Return Visit-Office Procedure: Patient instructed to return to the office for a follow up visit 3 months for continued evaluation and treatment.  Boneta Lucks, DPM    No follow-ups on file.   Boneta Lucks, DPM

## 2021-04-28 DIAGNOSIS — Z23 Encounter for immunization: Secondary | ICD-10-CM | POA: Diagnosis not present

## 2021-05-11 ENCOUNTER — Ambulatory Visit: Payer: Medicare Other

## 2021-06-25 ENCOUNTER — Ambulatory Visit: Payer: Medicare Other | Admitting: Internal Medicine

## 2021-06-29 ENCOUNTER — Ambulatory Visit (INDEPENDENT_AMBULATORY_CARE_PROVIDER_SITE_OTHER): Payer: Medicare Other | Admitting: Internal Medicine

## 2021-06-29 ENCOUNTER — Other Ambulatory Visit: Payer: Self-pay

## 2021-06-29 ENCOUNTER — Encounter: Payer: Self-pay | Admitting: Internal Medicine

## 2021-06-29 VITALS — BP 126/80 | HR 60 | Temp 98.1°F | Ht 70.0 in | Wt 217.0 lb

## 2021-06-29 DIAGNOSIS — I10 Essential (primary) hypertension: Secondary | ICD-10-CM

## 2021-06-29 DIAGNOSIS — E1159 Type 2 diabetes mellitus with other circulatory complications: Secondary | ICD-10-CM

## 2021-06-29 LAB — POCT GLYCOSYLATED HEMOGLOBIN (HGB A1C): Hemoglobin A1C: 6.1 % — AB (ref 4.0–5.6)

## 2021-06-29 MED ORDER — SILDENAFIL CITRATE 20 MG PO TABS: 60.0000 mg | ORAL_TABLET | Freq: Every day | ORAL | 11 refills | Status: DC | PRN

## 2021-06-29 NOTE — Assessment & Plan Note (Signed)
BP Readings from Last 3 Encounters:  06/29/21 126/80  12/23/20 114/62  04/28/20 124/74   Still well controlled on valsartan/HCTZ and amlodipine

## 2021-06-29 NOTE — Addendum Note (Signed)
Addended by: Pilar Grammes on: 06/29/2021 09:11 AM   Modules accepted: Orders

## 2021-06-29 NOTE — Progress Notes (Addendum)
Subjective:    Patient ID: Ricardo Reese, male    DOB: 11-19-1950, 71 y.o.   MRN: [id number]  HPI Here for follow up of diabetes  Doing well in general Gained weight due to winter and holiday eating Also slacked off on exercise  Sugars had been good Brief spike due to holiday eating after Thanksgiving Usually 100-110 fasting No low sugar reactions  Sees podiatrist for nails--feet okay No tingling or numbness  Current Outpatient Medications on File Prior to Visit  Medication Sig Dispense Refill   amLODipine (NORVASC) 5 MG tablet TAKE 1 TABLET BY MOUTH EVERY DAY 90 tablet 3   ammonium lactate (AMLACTIN) 12 % lotion Apply 1 application topically as needed for dry skin. 400 g 1   B Complex-C (SUPER B COMPLEX PO) Take 1 tablet by mouth daily.      cholecalciferol (VITAMIN D3) 25 MCG (1000 UNIT) tablet Take 1,000 Units by mouth daily.     fish oil-omega-3 fatty acids 1000 MG capsule Take 1 g by mouth daily.     glucose blood (CONTOUR NEXT TEST) test strip Use to obtain blood sugar once a day. Dx Code E11.9 100 each 3   metFORMIN (GLUCOPHAGE) 500 MG tablet TAKE 1 TABLET (500 MG TOTAL) BY MOUTH 2 (TWO) TIMES DAILY WITH A MEAL. 180 tablet 3   pravastatin (PRAVACHOL) 40 MG tablet TAKE 1 TABLET BY MOUTH EVERY DAY 90 tablet 3   sildenafil (REVATIO) 20 MG tablet Take 3-5 tablets (60-100 mg total) by mouth daily as needed. 50 tablet 11   valsartan-hydrochlorothiazide (DIOVAN-HCT) 320-25 MG tablet Take 1 tablet by mouth daily. 90 tablet 3   No current facility-administered medications on file prior to visit.    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    Past Surgical History:  Procedure Laterality Date   KNEE SURGERY  1969   cartilage removal right knee    melanoma back  09/2010   PROSTATE BIOPSY     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   Procedure: LEFT TOTAL HIP ARTHROPLASTY ANTERIOR APPROACH;  Surgeon: Mcarthur Rossetti, MD;  Location: [facility];  Service: Orthopedics;  Laterality: Left;    Family History  Problem Relation Age of Onset   Stroke Father    Lung cancer Father    Prostate cancer Brother    Heart disease Brother    Diabetes Neg Hx    Colon cancer Neg Hx    Stomach cancer Neg Hx    Esophageal cancer Neg Hx    Rectal cancer Neg Hx     Social History   Socioeconomic History   Marital status: Married    Spouse name: [name]   Number of children: 2   Years of education: Not on file   Highest education level: Not on file  Occupational History   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  Tobacco  Use   Smoking status: Never   Smokeless tobacco: Never  Vaping Use   Vaping Use: Never used  Substance and Sexual Activity   Alcohol use: Yes    Alcohol/week: 3.0 - 4.0 standard drinks    Types: 3 - 4 Cans of beer per week    Comment: occasional beer   Drug use: No   Sexual activity: Yes  Other Topics Concern   Not on file  Social History Narrative   Has living will   Wife is health care POA--- daughters next   Would accept resuscitation   No tube feeds if cognitively unaware   Social Determinants of Health   Financial Resource Strain: Low Risk    Difficulty of Paying Living Expenses: Not hard at all  Food Insecurity: Not on file  Transportation Needs: Not on file  Physical Activity: Not on file  Stress: Not on file  Social Connections: Not on file  Intimate Partner Violence: Not on file   Review of Systems Sleeps okay Weight up 14#    Objective:   Physical  Exam Constitutional:      Appearance: Normal appearance.  Cardiovascular:     Rate and Rhythm: Normal rate and regular rhythm.     Pulses: Normal pulses.     Heart sounds:    No gallop.     Comments: Soft systolic murmur Pulmonary:     Effort: Pulmonary effort is normal.     Breath sounds: Normal breath sounds. No wheezing or rales.  Musculoskeletal:     Cervical back: Neck supple.     Right lower leg: No edema.     Left lower leg: No edema.  Lymphadenopathy:     Cervical: No cervical adenopathy.  Neurological:     Mental Status: He is alert.  Psychiatric:        Mood and Affect: Mood normal.        Behavior: Behavior normal.           Assessment & Plan:

## 2021-06-29 NOTE — Assessment & Plan Note (Signed)
Lab Results  Component Value Date   HGBA1C 6.1 (A) 06/29/2021   Still excellent control with metformin 500mg  bid HTN ---controlled No changes needed

## 2021-07-01 ENCOUNTER — Ambulatory Visit (INDEPENDENT_AMBULATORY_CARE_PROVIDER_SITE_OTHER): Payer: Medicare Other | Admitting: Podiatry

## 2021-07-01 ENCOUNTER — Other Ambulatory Visit: Payer: Self-pay

## 2021-07-01 DIAGNOSIS — M79675 Pain in left toe(s): Secondary | ICD-10-CM

## 2021-07-01 DIAGNOSIS — B351 Tinea unguium: Secondary | ICD-10-CM

## 2021-07-01 DIAGNOSIS — M79674 Pain in right toe(s): Secondary | ICD-10-CM | POA: Diagnosis not present

## 2021-07-01 NOTE — Progress Notes (Signed)
Subjective: Ricardo Reese is a 71 y.o.  male returns to office today for thickened elongated dystrophic toenails x10.  Painful to touch.  Patient would like to have them debrided his not able to do it himself.  He denies any other acute complaints . Objective:  Vitals: Reviewed  General: Well developed, nourished, in no acute distress, alert and oriented x3   Dermatology: Skin is warm, dry and supple bilateral.  Interdigital maceration noted without any underlying wound to the left 4th and 5th digit interdigital space.  No clinical signs of infection noted. Nail Exam: Pt has thick disfigured discolored nails with subungual debris noted bilateral entire nail hallux through fifth toenails.  Pain on palpation to the nails.  Neurovascular status: Intact. No lower extremity swelling; No pain with calf compression bilateral.  Musculoskeletal: Decreased tenderness to palpation of the Medial hallux nail fold(s). Muscular strength within normal limits bilateral.   Assesement and Plan:  Xerosis right heel with fissure and cracked -Clinically healed with ammonium lactate.  No further xerosis or skin fissures noted.   Interdigital maceration 4th and 5th digit on the left -I explained to the patient the etiology of maceration various treatment options were discussed.  I told him that given that he is a diabetic he should be careful and keep it dry between the toes.  I discussed with him to Betadine paint in between the toes over the next few weeks to allow it to dry up.  He states understanding and will do so.  Onychomycosis with pain  -Nails palliatively debrided as below. -Educated on self-care  Procedure: Nail Debridement Rationale: pain  Type of Debridement: manual, sharp debridement. Instrumentation: Nail nipper, rotary burr. Number of Nails: 10  Procedures and Treatment: Consent by patient was obtained for treatment procedures. The patient understood the discussion of treatment and  procedures well. All questions were answered thoroughly reviewed. Debridement of mycotic and hypertrophic toenails, 1 through 5 bilateral and clearing of subungual debris. No ulceration, no infection noted.  Return Visit-Office Procedure: Patient instructed to return to the office for a follow up visit 3 months for continued evaluation and treatment.  Boneta Lucks, DPM    No follow-ups on file.   Boneta Lucks, DPM

## 2021-08-06 ENCOUNTER — Other Ambulatory Visit: Payer: Self-pay | Admitting: Internal Medicine

## 2021-08-24 ENCOUNTER — Telehealth: Payer: Self-pay

## 2021-08-24 NOTE — Progress Notes (Signed)
Transition CCM to Self Care ? ?Patient contacted to inform they have achieved their CCM goals and no longer need to be contacted as frequently. Patient advised services will still be available to them if they would like to reach out or have any new health concerns. Verified patient had contact information to pharmacist and health concierge on hand. Patient made aware CCM services would be continued if desired. Patient consented to cancel future CCM appointments. ? ?Charlene Brooke, CPP notified ? ?Marijean Niemann, RMA ?Clinical Pharmacy Assistant ?[phone] ?

## 2021-08-26 DIAGNOSIS — Z8546 Personal history of malignant neoplasm of prostate: Secondary | ICD-10-CM | POA: Diagnosis not present

## 2021-09-02 DIAGNOSIS — N5201 Erectile dysfunction due to arterial insufficiency: Secondary | ICD-10-CM | POA: Diagnosis not present

## 2021-09-02 DIAGNOSIS — E349 Endocrine disorder, unspecified: Secondary | ICD-10-CM | POA: Diagnosis not present

## 2021-09-02 DIAGNOSIS — Z8546 Personal history of malignant neoplasm of prostate: Secondary | ICD-10-CM | POA: Diagnosis not present

## 2021-09-13 ENCOUNTER — Ambulatory Visit (INDEPENDENT_AMBULATORY_CARE_PROVIDER_SITE_OTHER): Payer: Medicare Other

## 2021-09-13 ENCOUNTER — Encounter: Payer: Self-pay | Admitting: Physician Assistant

## 2021-09-13 ENCOUNTER — Ambulatory Visit (INDEPENDENT_AMBULATORY_CARE_PROVIDER_SITE_OTHER): Payer: Medicare Other | Admitting: Physician Assistant

## 2021-09-13 DIAGNOSIS — M25511 Pain in right shoulder: Secondary | ICD-10-CM

## 2021-09-13 MED ORDER — METHYLPREDNISOLONE ACETATE 40 MG/ML IJ SUSP
40.0000 mg | INTRAMUSCULAR | Status: AC | PRN
  Administered 2021-09-13: 40 mg via INTRA_ARTICULAR

## 2021-09-13 MED ORDER — LIDOCAINE HCL 1 % IJ SOLN
3.0000 mL | INTRAMUSCULAR | Status: AC | PRN
  Administered 2021-09-13: 3 mL

## 2021-09-13 NOTE — Progress Notes (Signed)
? ?Office Visit Note ?  ?Patient: Ricardo Reese           ?Date of Birth: February 10, 1951           ?MRN: [id number] ?Visit Date: 09/13/2021 ?             ?Requested by: Venia Carbon, MD ?[address] ?[place],  [place] [address] ?PCP: Venia Carbon, MD ? ? ?Assessment & Plan: ?Visit Diagnoses:  ?1. Acute pain of right shoulder   ? ? ?Plan: He shown pendulum, forward flexion, and wall crawl exercises.  We will follow-up with [place] if pain persist or becomes worse.  Questions were encouraged and answered at length today. ? ?Follow-Up Instructions: Return if symptoms worsen or fail to improve.  ? ?Orders:  ?Orders Placed This Encounter  ?Procedures  ? Large Joint Inj  ? XR Shoulder Right  ? ?No orders of the defined types were placed in this encounter. ? ? ? ? Procedures: ?Large Joint Inj: R subacromial bursa on 09/13/2021 9:03 AM ?Indications: pain ?Details: 22 G 1.5 in needle, superolateral approach ? ?Arthrogram: No ? ?Medications: 3 mL lidocaine 1 %; 40 mg methylPREDNISolone acetate 40 MG/ML ?Outcome: tolerated well, no immediate complications ?Procedure, treatment alternatives, risks and benefits explained, specific risks discussed. Consent was given by the patient. Immediately prior to procedure a time out was called to verify the correct patient, procedure, equipment, support staff and site/side marked as required. Patient was prepped and draped in the usual sterile fashion.  ? ? ? ? ?Clinical Data: ?No additional findings. ? ? ?Subjective: ?Chief Complaint  ?Patient presents with  ? Right Shoulder - Pain  ? ? ?HPI ?Ricardo Reese pleasant 72 year old male comes in today with right shoulder pain has been ongoing for the past couple weeks no known injury.  He has pain with abduction and forward flexion of the arm otherwise no pain.  States the pain overall is improving.  He notes no injury.  He states he just woke up 1 morning and noticed the pain.  Last few days pain has been 2-3 out of 10 pain at worst.  Has had  no treatment for this other than trying some ibuprofen.  He denies any radicular symptoms down the right arm. ?Review of Systems ?Negative for fevers or chills.  ? ?Objective: ?Vital Signs: There were no vitals taken for this visit. ? ?Physical Exam ?Constitutional:   ?   Appearance: He is not ill-appearing or diaphoretic.  ?Pulmonary:  ?   Effort: Pulmonary effort is normal.  ?Neurological:  ?   Mental Status: He is alert and oriented to person, place, and time.  ?Psychiatric:     ?   Mood and Affect: Mood normal.  ? ? ?Ortho Exam ?Bilateral shoulders: 5/5 strength with external/internal rotation against resistance.  Empty can test negative bilaterally.  Liftoff test negative bilaterally impingement testing is negative bilaterally.  Forward flexion actively and passively causes some discomfort in the anterior shoulder as does have active abduction of the right shoulder that he has full abduction full forward flexion of the shoulders. ?Specialty Comments:  ?No specialty comments available. ? ?Imaging: ?XR Shoulder Right ? ?Result Date: 09/13/2021 ?Right shoulder 3 views: No acute fractures.  Shoulder is well located.  Downsloping acromion.  Glenohumeral joint is well-maintained.  ? ? ?PMFS History: ?Patient Active Problem List  ? Diagnosis Date Noted  ? Aortic heart murmur 04/28/2020  ? Right knee pain 10/25/2019  ? History of prostate  cancer 01/02/2018  ? Advance directive discussed with patient 09/26/2016  ? Unilateral primary osteoarthritis, left hip 09/20/2016  ? Status post left hip replacement 09/20/2016  ? Routine general medical examination at a health care facility 08/17/2011  ? History of melanoma   ? Hyperlipemia 05/20/2008  ? Benign essential hypertension 05/20/2008  ? Type 2 diabetes mellitus with other circulatory complications ([place]) 38/03/1750  ? ?Past Medical History:  ?Diagnosis Date  ? Diabetes mellitus without complication ([place])   ? History of kidney stones   ? has had twice  ? Hyperlipidemia   ?  Hypertension   ? Impaired fasting glucose   ? Kidney stone 1983  ? Melanoma of back [facility] 2013  ? Dr Evorn Gong  ? Pneumonia   ? walking back in 2002  ? Prostate cancer ([place])   ? Skin cancer 06/14/2011  ? 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  ?Family History  ?Problem Relation Age of Onset  ? Stroke Father   ? Lung cancer Father   ? Prostate cancer Brother   ? Heart disease Brother   ? Diabetes Neg Hx   ? Colon cancer Neg Hx   ? Stomach cancer Neg Hx   ? Esophageal cancer Neg Hx   ? Rectal cancer Neg Hx   ?  ?Past Surgical History:  ?Procedure Laterality Date  ? KNEE SURGERY  1969  ? cartilage removal right knee   ? melanoma back  09/2010  ? PROSTATE BIOPSY    ? 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  ? 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  ? Procedure: LEFT TOTAL HIP ARTHROPLASTY ANTERIOR APPROACH;  Surgeon: Mcarthur Rossetti, MD;  Location: [facility];  Service: Orthopedics;  Laterality: Left;  ? ?Social History  ? ?Occupational History  ? Occupation: Dance movement psychotherapist  ?  Employer: [employer]  ?  Comment: Retired  ?Tobacco Use  ? Smoking status: Never  ? Smokeless tobacco: Never  ?Vaping Use  ? Vaping Use: Never used  ?Substance and Sexual Activity  ? Alcohol use: Yes  ?  Alcohol/week: 3.0 - 4.0 standard drinks  ?  Types: 3 - 4 Cans of beer per week  ?  Comment: occasional beer  ? Drug use: No  ? Sexual activity: Yes  ? ? ? ? ? ? ?

## 2021-09-22 ENCOUNTER — Ambulatory Visit (INDEPENDENT_AMBULATORY_CARE_PROVIDER_SITE_OTHER): Payer: Medicare Other | Admitting: Orthopaedic Surgery

## 2021-09-22 ENCOUNTER — Encounter: Payer: Self-pay | Admitting: Orthopaedic Surgery

## 2021-09-22 DIAGNOSIS — S76311A Strain of muscle, fascia and tendon of the posterior muscle group at thigh level, right thigh, initial encounter: Secondary | ICD-10-CM

## 2021-09-22 MED ORDER — METHOCARBAMOL 750 MG PO TABS: 750.0000 mg | ORAL_TABLET | Freq: Three times a day (TID) | ORAL | 1 refills | Status: DC | PRN

## 2021-09-22 NOTE — Progress Notes (Signed)
The patient is a 71 year old gentleman who comes in for evaluation treatment of right pain in the hamstring area of his thigh.  He had an acute injury when he was swinging a golf club and he slipped.  He has had pain since then and this seems to be in the mid hamstring area.  He denies any numbness and tingling in that foot.  He is ambulating using a cane.  He is an active gentleman who does play a lot of golf.  He is scheduled to travel to play next week. ? ?Examination of his right thigh shows pain in the mid hamstring area.  This seems to be consistent with some type of partial or full tear.  There is not significant weakness in his hip flexors or knee extensor aspect of things on the right side. ? ?I recommended compressive wrap to try and oral anti-inflammatories as well as methocarbamol and Voltaren gel.  This is going to be something that just can to take time to heal.  If it is still bothering him after 6 to 8 weeks he will let us know because I would set him up for outpatient physical therapy.  All questions and concerns were answered and addressed. ?

## 2021-09-29 ENCOUNTER — Ambulatory Visit: Payer: Medicare Other | Admitting: Orthopaedic Surgery

## 2021-10-13 ENCOUNTER — Encounter: Payer: Self-pay | Admitting: Physician Assistant

## 2021-10-13 ENCOUNTER — Ambulatory Visit (INDEPENDENT_AMBULATORY_CARE_PROVIDER_SITE_OTHER): Payer: Medicare Other | Admitting: Physician Assistant

## 2021-10-13 ENCOUNTER — Ambulatory Visit (INDEPENDENT_AMBULATORY_CARE_PROVIDER_SITE_OTHER): Payer: Medicare Other

## 2021-10-13 DIAGNOSIS — M7062 Trochanteric bursitis, left hip: Secondary | ICD-10-CM

## 2021-10-13 DIAGNOSIS — Z96642 Presence of left artificial hip joint: Secondary | ICD-10-CM

## 2021-10-13 MED ORDER — METHYLPREDNISOLONE ACETATE 40 MG/ML IJ SUSP
40.0000 mg | INTRAMUSCULAR | Status: AC | PRN
  Administered 2021-10-13: 40 mg via INTRA_ARTICULAR

## 2021-10-13 MED ORDER — LIDOCAINE HCL 1 % IJ SOLN
3.0000 mL | INTRAMUSCULAR | Status: AC | PRN
  Administered 2021-10-13: 3 mL

## 2021-10-13 NOTE — Progress Notes (Signed)
? ?Office Visit Note ?  ?Patient: Ricardo Reese           ?Date of Birth: 10-Oct-1950           ?MRN: [id number] ?Visit Date: 10/13/2021 ?             ?Requested by: Venia Carbon, MD ?[address] ?[place],  [place] [address] ?PCP: Venia Carbon, MD ? ? ?Assessment & Plan: ?Visit Diagnoses:  ?1. History of left hip replacement   ?2. Trochanteric bursitis, left hip   ? ? ?Plan: Discussed with him IT band stretching exercises.  Follow-up with [place] on as-needed basis if condition fails to improve or becomes worse.  Questions were encouraged and answered ? ?Follow-Up Instructions: Return if symptoms worsen or fail to improve.  ? ?Orders:  ?Orders Placed This Encounter  ?Procedures  ? Large Joint Inj: L greater trochanter  ? XR HIP UNILAT W OR W/O PELVIS 2-3 VIEWS LEFT  ? ?No orders of the defined types were placed in this encounter. ? ? ? ? Procedures: ?Large Joint Inj: L greater trochanter on 10/13/2021 4:29 PM ?Indications: pain ?Details: 22 G 1.5 in needle, lateral approach ? ?Arthrogram: No ? ?Medications: 3 mL lidocaine 1 %; 40 mg methylPREDNISolone acetate 40 MG/ML ?Outcome: tolerated well, no immediate complications ?Procedure, treatment alternatives, risks and benefits explained, specific risks discussed. Consent was given by the patient. Immediately prior to procedure a time out was called to verify the correct patient, procedure, equipment, support staff and site/side marked as required. Patient was prepped and draped in the usual sterile fashion.  ? ? ? ? ?Clinical Data: ?No additional findings. ? ? ?Subjective: ?Chief Complaint  ?Patient presents with  ? Left Hip - Pain  ? ? ?HPI ?Ricardo Reese returns today with left hip pain.  History of left total hip arthroplasty 09/20/2016.  He states for the last couple weeks he has had pain lateral aspect of the left hip.  No known injury.  He has tried ibuprofen without any real relief.  He is having no real radicular symptoms or back pain. ? ?Review of Systems   ?Constitutional:  Negative for chills and fever.  ? ? ?Objective: ?Vital Signs: There were no vitals taken for this visit. ? ?Physical Exam ?Constitutional:   ?   Appearance: He is not ill-appearing or diaphoretic.  ?Pulmonary:  ?   Effort: Pulmonary effort is normal.  ?Neurological:  ?   Mental Status: He is alert and oriented to person, place, and time.  ?Psychiatric:     ?   Mood and Affect: Mood normal.  ? ? ?Ortho Exam ?Bilateral hips full range of motion.  Slight discomfort left hip with extreme internal rotation.  Tenderness over the trochanteric region left hip and knee. ?Specialty Comments:  ?No specialty comments available. ? ?Imaging: ?XR HIP UNILAT W OR W/O PELVIS 2-3 VIEWS LEFT ? ?Result Date: 10/13/2021 ?AP pelvis and lateral view left hip: Bilateral hips well located.  Status post left total hip arthroplasty with well-seated components no acute findings or hardware failure.  ? ? ?PMFS History: ?Patient Active Problem List  ? Diagnosis Date Noted  ? Aortic heart murmur 04/28/2020  ? Right knee pain 10/25/2019  ? History of prostate cancer 01/02/2018  ? Advance directive discussed with patient 09/26/2016  ? Unilateral primary osteoarthritis, left hip 09/20/2016  ? Status post left hip replacement 09/20/2016  ? Routine general medical examination at a health care facility 08/17/2011  ?  History of melanoma   ? Hyperlipemia 05/20/2008  ? Benign essential hypertension 05/20/2008  ? Type 2 diabetes mellitus with other circulatory complications ([place]) 09/81/1914  ? ?Past Medical History:  ?Diagnosis Date  ? Diabetes mellitus without complication ([place])   ? History of kidney stones   ? has had twice  ? Hyperlipidemia   ? Hypertension   ? Impaired fasting glucose   ? Kidney stone 1983  ? Melanoma of back [facility]) 2013  ? Dr Evorn Gong  ? Pneumonia   ? walking back in 2002  ? Prostate cancer ([place])   ? Skin cancer 06/14/2011  ? 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  ?Family History  ?Problem Relation Age of Onset  ? Stroke  Father   ? Lung cancer Father   ? Prostate cancer Brother   ? Heart disease Brother   ? Diabetes Neg Hx   ? Colon cancer Neg Hx   ? Stomach cancer Neg Hx   ? Esophageal cancer Neg Hx   ? Rectal cancer Neg Hx   ?  ?Past Surgical History:  ?Procedure Laterality Date  ? KNEE SURGERY  1969  ? cartilage removal right knee   ? melanoma back  09/2010  ? PROSTATE BIOPSY    ? 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  ? 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  ? Procedure: LEFT TOTAL HIP ARTHROPLASTY ANTERIOR APPROACH;  Surgeon: Mcarthur Rossetti, MD;  Location: [facility];  Service: Orthopedics;  Laterality: Left;  ? ?Social History  ? ?Occupational History  ? Occupation: Dance movement psychotherapist  ?  Employer: [employer]  ?  Comment: Retired  ?Tobacco Use  ? Smoking status: Never  ? Smokeless tobacco: Never  ?Vaping Use  ? Vaping Use: Never used  ?Substance and Sexual Activity  ? Alcohol use: Yes  ?  Alcohol/week: 3.0 - 4.0 standard drinks  ?  Types: 3 - 4 Cans of beer per week  ?  Comment: occasional beer  ? Drug use: No  ? Sexual activity: Yes  ? ? ? ? ? ? ?

## 2021-10-14 ENCOUNTER — Ambulatory Visit: Payer: Medicare Other | Admitting: Podiatry

## 2021-10-15 ENCOUNTER — Telehealth: Payer: Self-pay | Admitting: Physician Assistant

## 2021-10-15 NOTE — Telephone Encounter (Signed)
Patient called. Says he is still in pain. Would like to know what to do. His call back number is [phone] ?

## 2021-10-18 NOTE — Telephone Encounter (Signed)
Please advise 

## 2021-10-18 NOTE — Telephone Encounter (Signed)
Called and advised. He will continue with the voltaren gel and cb if continued to not improve to try PT ?

## 2021-10-18 NOTE — Telephone Encounter (Signed)
Lvm for pt to cb to discuss  

## 2021-10-19 ENCOUNTER — Ambulatory Visit: Payer: Medicare Other | Admitting: Podiatry

## 2021-10-29 ENCOUNTER — Telehealth: Payer: Medicare Other

## 2021-11-14 ENCOUNTER — Other Ambulatory Visit: Payer: Self-pay

## 2021-11-14 ENCOUNTER — Emergency Department
Admission: EM | Admit: 2021-11-14 | Discharge: 2021-11-14 | Disposition: A | Discharge status: Home / Self Care | Payer: Medicare Other | Attending: Emergency Medicine | Admitting: Emergency Medicine

## 2021-11-14 ENCOUNTER — Emergency Department: Payer: Medicare Other

## 2021-11-14 DIAGNOSIS — Z23 Encounter for immunization: Secondary | ICD-10-CM | POA: Insufficient documentation

## 2021-11-14 DIAGNOSIS — S61215A Laceration without foreign body of left ring finger without damage to nail, initial encounter: Secondary | ICD-10-CM | POA: Insufficient documentation

## 2021-11-14 DIAGNOSIS — S61213A Laceration without foreign body of left middle finger without damage to nail, initial encounter: Secondary | ICD-10-CM | POA: Diagnosis not present

## 2021-11-14 DIAGNOSIS — S65503A Unspecified injury of blood vessel of left middle finger, initial encounter: Secondary | ICD-10-CM | POA: Diagnosis present

## 2021-11-14 DIAGNOSIS — W293XXA Contact with powered garden and outdoor hand tools and machinery, initial encounter: Secondary | ICD-10-CM | POA: Insufficient documentation

## 2021-11-14 MED ORDER — TETANUS-DIPHTH-ACELL PERTUSSIS 5-2.5-18.5 LF-MCG/0.5 IM SUSY
0.5000 mL | PREFILLED_SYRINGE | Freq: Once | INTRAMUSCULAR | Status: AC
  Administered 2021-11-14: 0.5 mL via INTRAMUSCULAR
  Filled 2021-11-14: qty 0.5

## 2021-11-14 MED ORDER — LIDOCAINE HCL (PF) 1 % IJ SOLN
10.0000 mL | Freq: Once | INTRAMUSCULAR | Status: AC
  Administered 2021-11-14: 5 mL
  Filled 2021-11-14: qty 10

## 2021-11-14 NOTE — ED Triage Notes (Signed)
Pt states he hit his fingers with the hedge clippers- pt middle three fingers on his left hand have lacerations, bleeding controlled at this time

## 2021-11-14 NOTE — Discharge Instructions (Addendum)
Keep the wounds clean, dry, and covered. See Dr. Silvio Pate in 7-10 days for suture removal.

## 2021-11-14 NOTE — ED Provider Notes (Signed)
[facility] Emergency Department Provider Note     Event Date/Time   First MD Initiated Contact with Patient 11/14/21 1453     (approximate)   History   Laceration   HPI  Ricardo Reese is a 71 y.o. male with a noncontributory medical history, presents to the ED for evaluation of lacerations across his fingers.  Patient presents with laceration to the distal fat pad of his index middle and ring fingers on the left hand.  Injury occurred while he was using his hedge tremor.  Denies any other injury at this time.  Tetanus status is unknown at this time.   Physical Exam   Triage Vital Signs: ED Triage Vitals  Enc Vitals Group     BP 11/14/21 1338 (!) 148/67     Pulse Rate 11/14/21 1338 78     Resp 11/14/21 1338 18     Temp 11/14/21 1338 (!) 97.5 F (36.4 C)     Temp Source 11/14/21 1338 Oral     SpO2 11/14/21 1338 99 %     Weight 11/14/21 1336 206 lb (93.4 kg)     Height 11/14/21 1336 '5\' 9"'$  (1.753 m)     Head Circumference --      Peak Flow --      Pain Score 11/14/21 1335 5     Pain Loc --      Pain Edu? --      Excl. in [place]? --     Most recent vital signs: Vitals:   11/14/21 1338  BP: (!) 148/67  Pulse: 78  Resp: 18  Temp: (!) 97.5 F (36.4 C)  SpO2: 99%    General Awake, no distress.  CV:  Good peripheral perfusion.  RESP:  Normal effort.  ABD:  No distention.  MSK:  Left hand with multiple superficial lacerations of distal fat pad and phalanx of the index long and middle fingers.  Minimal active bleeding at this time.  Normal composite fist distally.   ED Results / Procedures / Treatments   Labs (all labs ordered are listed, but only abnormal results are displayed) Labs Reviewed - No data to display   EKG    RADIOLOGY    No results found.   PROCEDURES:  Critical Care performed: No  ..Laceration Repair  Date/Time: 11/14/2021 3:05 PM Performed by: Melvenia Needles, PA-C Authorized by: Melvenia Needles, PA-C   Consent:    Consent obtained:  Verbal   Consent given by:  Patient   Risks, benefits, and alternatives were discussed: yes     Risks discussed:  Pain and poor wound healing   Alternatives discussed:  No treatment Universal protocol:    Site/side marked: yes     Patient identity confirmed:  Verbally with patient Anesthesia:    Anesthesia method:  Nerve block   Block location:  Flexor tendon sheath   Block needle gauge:  27 G   Block anesthetic:  Lidocaine 1% w/o epi   Block technique:  Transthecal block   Block injection procedure:  Anatomic landmarks palpated, introduced needle, incremental injection and negative aspiration for blood   Block outcome:  Anesthesia achieved Laceration details:    Location:  Finger   Finger location:  L index finger (L long finger; L ring finger)   Wound length (cm): index: 0.5; long: 1; ring: 2.   Depth (mm):  3 Pre-procedure details:    Preparation:  Patient was prepped and draped in  usual sterile fashion Exploration:    Limited defect created (wound extended): no     Contaminated: no   Treatment:    Area cleansed with:  Saline and povidone-iodine   Irrigation method:  Syringe   Debridement:  None   Undermining:  None   Scar revision: no   Skin repair:    Repair method:  Sutures   Suture size:  5-0   Suture material:  Nylon   Suture technique:  Simple interrupted   Number of sutures:  10 (total) Approximation:    Approximation:  Close Repair type:    Repair type:  Simple Post-procedure details:    Dressing:  Non-adherent dressing   Procedure completion:  Tolerated well, no immediate complications   MEDICATIONS ORDERED IN ED: Medications  Tdap (BOOSTRIX) injection 0.5 mL (0.5 mLs Intramuscular Given 11/14/21 1501)  lidocaine (PF) (XYLOCAINE) 1 % injection 10 mL (5 mLs Infiltration Given 11/14/21 1506)     IMPRESSION / MDM / ASSESSMENT AND PLAN / ED COURSE  I reviewed the triage vital signs and the nursing notes.                               Differential diagnosis includes, but is not limited to, finger laceration, finger contusion, nail avulsion.  Patient's presentation is most consistent with acute, uncomplicated illness.  Patient's diagnosis is consistent with finger lacerations.  Facial bones are repaired using sutures with good wound edge approximation.  Patient will be discharged home with wound care supplies and instructions. Patient is to follow up with his primary provider for suture removal in 7 to 10 days as needed or otherwise directed. Patient is given ED precautions to return to the ED for any worsening or new symptoms.     FINAL CLINICAL IMPRESSION(S) / ED DIAGNOSES   Final diagnoses:  Laceration of left middle finger without foreign body without damage to nail, initial encounter  Laceration of left ring finger without foreign body without damage to nail, initial encounter     Rx / DC Orders   ED Discharge Orders     None        Note:  This document was prepared using Dragon voice recognition software and may include unintentional dictation errors.    Melvenia Needles, PA-C 11/14/21 1630    Rada Hay, MD 11/14/21 [phone]

## 2021-11-22 ENCOUNTER — Encounter: Payer: Self-pay | Admitting: Internal Medicine

## 2021-11-22 ENCOUNTER — Ambulatory Visit (INDEPENDENT_AMBULATORY_CARE_PROVIDER_SITE_OTHER): Payer: Medicare Other | Admitting: Internal Medicine

## 2021-11-22 DIAGNOSIS — S61215D Laceration without foreign body of left ring finger without damage to nail, subsequent encounter: Secondary | ICD-10-CM | POA: Diagnosis not present

## 2021-11-22 DIAGNOSIS — S61215A Laceration without foreign body of left ring finger without damage to nail, initial encounter: Secondary | ICD-10-CM | POA: Insufficient documentation

## 2021-11-22 DIAGNOSIS — L03012 Cellulitis of left finger: Secondary | ICD-10-CM | POA: Diagnosis not present

## 2021-11-22 MED ORDER — CEPHALEXIN 500 MG PO CAPS: 500.0000 mg | ORAL_CAPSULE | Freq: Three times a day (TID) | ORAL | 0 refills | Status: DC

## 2021-11-22 NOTE — Progress Notes (Signed)
Subjective:    Patient ID: Ricardo Reese, male    DOB: 1951/02/16, 71 y.o.   MRN: [id number]  HPI Here for ER follow up and suture removal  Was reaching up high with hedge trimmer and lost balance--6/4 Hit left 3 fingers (2-4) Pretty painful To ER---sutures in each of the fingers  Pain is better now----just a little in 4th finger No discharge from wounds  Current Outpatient Medications on File Prior to Visit  Medication Sig Dispense Refill   amLODipine (NORVASC) 5 MG tablet TAKE 1 TABLET BY MOUTH EVERY DAY 90 tablet 3   ammonium lactate (AMLACTIN) 12 % lotion Apply 1 application topically as needed for dry skin. 400 g 1   B Complex-C (SUPER B COMPLEX PO) Take 1 tablet by mouth daily.      cholecalciferol (VITAMIN D3) 25 MCG (1000 UNIT) tablet Take 1,000 Units by mouth daily.     fish oil-omega-3 fatty acids 1000 MG capsule Take 1 g by mouth daily.     glucose blood (CONTOUR NEXT TEST) test strip Use to obtain blood sugar once a day. Dx Code E11.9 100 each 3   metFORMIN (GLUCOPHAGE) 500 MG tablet TAKE 1 TABLET (500 MG TOTAL) BY MOUTH 2 (TWO) TIMES DAILY WITH A MEAL. 180 tablet 3   methocarbamol (ROBAXIN) 750 MG tablet Take 1 tablet (750 mg total) by mouth every 8 (eight) hours as needed for muscle spasms. 60 tablet 1   pravastatin (PRAVACHOL) 40 MG tablet TAKE 1 TABLET BY MOUTH EVERY DAY 90 tablet 3   sildenafil (REVATIO) 20 MG tablet Take 3-5 tablets (60-100 mg total) by mouth daily as needed. 50 tablet 11   valsartan-hydrochlorothiazide (DIOVAN-HCT) 320-25 MG tablet TAKE 1 TABLET BY MOUTH EVERY DAY 90 tablet 3   No current facility-administered medications on file prior to visit.    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    Past Surgical History:  Procedure Laterality Date   KNEE SURGERY  1969   cartilage removal right knee    melanoma back  09/2010   PROSTATE BIOPSY     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   Procedure: LEFT TOTAL HIP ARTHROPLASTY ANTERIOR APPROACH;  Surgeon: Mcarthur Rossetti, MD;  Location: [place];  Service: Orthopedics;  Laterality: Left;    Family History  Problem Relation Age of Onset   Stroke Father    Lung cancer Father    Prostate cancer Brother    Heart disease Brother    Diabetes Neg Hx    Colon cancer Neg Hx    Stomach cancer Neg Hx    Esophageal cancer Neg Hx    Rectal cancer Neg Hx     Social History   Socioeconomic History   Marital status: Married    Spouse name: Ricardo Reese   Number of children: 2   Years of education: Not on file   Highest education level: Not on  file  Occupational History   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  Tobacco Use   Smoking status: Never   Smokeless tobacco: Never  Vaping Use   Vaping Use: Never used  Substance and Sexual Activity   Alcohol use: Yes    Alcohol/week: 3.0 - 4.0 standard drinks of alcohol    Types: 3 - 4 Cans of beer per week    Comment: occasional beer   Drug use: No   Sexual activity: Yes  Other Topics Concern   Not on file  Social History Narrative   Has living will   Wife is health care POA--- daughters next   Would accept resuscitation   No tube feeds if cognitively unaware   Social Determinants of Health   Financial Resource Strain: Low Risk  (02/03/2021)   Overall Financial Resource Strain (CARDIA)    Difficulty of Paying Living Expenses: Not hard at all  Food Insecurity: Not on file  Transportation Needs: No Transportation Needs (07/18/2018)   PRAPARE - Civil engineer, contracting (Medical): No    Lack of Transportation (Non-Medical): No  Physical Activity: Not on file  Stress: Not on file  Social Connections: Not on file  Intimate Partner Violence: Not on file   Review of Systems No fever Has lost weight---eating less     Objective:   Physical Exam Constitutional:      Appearance: Normal appearance.  Skin:    Comments: Healing lacerations in tips of 2nd (1 sutures), 3rd (3 sutures) and 4th fingers (6 sutures) Mild purplish discoloration around wounds  Some tenderness and slight warmth 4th finger  Neurological:     Mental Status: He is alert.            Assessment & Plan:

## 2021-11-22 NOTE — Assessment & Plan Note (Signed)
And other fingers --- 2nd and 3rd Sutures removed without incident Wounds well opposed---steristrips not practical there

## 2021-11-22 NOTE — Assessment & Plan Note (Signed)
More warmth and tenderness than expected with healing Will give 5 days of cephalexin 500 tid

## 2021-12-08 ENCOUNTER — Other Ambulatory Visit: Payer: Self-pay | Admitting: Internal Medicine

## 2021-12-28 DIAGNOSIS — Z85820 Personal history of malignant melanoma of skin: Secondary | ICD-10-CM | POA: Diagnosis not present

## 2021-12-28 DIAGNOSIS — L570 Actinic keratosis: Secondary | ICD-10-CM | POA: Diagnosis not present

## 2021-12-28 DIAGNOSIS — D2261 Melanocytic nevi of right upper limb, including shoulder: Secondary | ICD-10-CM | POA: Diagnosis not present

## 2021-12-28 DIAGNOSIS — D225 Melanocytic nevi of trunk: Secondary | ICD-10-CM | POA: Diagnosis not present

## 2021-12-28 DIAGNOSIS — D2262 Melanocytic nevi of left upper limb, including shoulder: Secondary | ICD-10-CM | POA: Diagnosis not present

## 2021-12-28 DIAGNOSIS — L821 Other seborrheic keratosis: Secondary | ICD-10-CM | POA: Diagnosis not present

## 2021-12-28 DIAGNOSIS — X32XXXA Exposure to sunlight, initial encounter: Secondary | ICD-10-CM | POA: Diagnosis not present

## 2021-12-31 ENCOUNTER — Encounter: Payer: Self-pay | Admitting: Internal Medicine

## 2021-12-31 ENCOUNTER — Ambulatory Visit (INDEPENDENT_AMBULATORY_CARE_PROVIDER_SITE_OTHER): Payer: Medicare Other | Admitting: Internal Medicine

## 2021-12-31 VITALS — BP 120/74 | HR 53 | Temp 97.7°F | Ht 69.0 in | Wt 198.0 lb

## 2021-12-31 DIAGNOSIS — E1159 Type 2 diabetes mellitus with other circulatory complications: Secondary | ICD-10-CM | POA: Diagnosis not present

## 2021-12-31 DIAGNOSIS — M21372 Foot drop, left foot: Secondary | ICD-10-CM | POA: Diagnosis not present

## 2021-12-31 DIAGNOSIS — Z0000 Encounter for general adult medical examination without abnormal findings: Secondary | ICD-10-CM | POA: Diagnosis not present

## 2021-12-31 DIAGNOSIS — I10 Essential (primary) hypertension: Secondary | ICD-10-CM | POA: Diagnosis not present

## 2021-12-31 DIAGNOSIS — I358 Other nonrheumatic aortic valve disorders: Secondary | ICD-10-CM

## 2021-12-31 LAB — RENAL FUNCTION PANEL
Albumin: 4.8 g/dL (ref 3.5–5.2)
BUN: 24 mg/dL — ABNORMAL HIGH (ref 6–23)
CO2: 27 mEq/L (ref 19–32)
Calcium: 9.6 mg/dL (ref 8.4–10.5)
Chloride: 102 mEq/L (ref 96–112)
Creatinine, Ser: 0.99 mg/dL (ref 0.40–1.50)
GFR: 77.16 mL/min (ref >60.00)
Glucose, Bld: 104 mg/dL — ABNORMAL HIGH (ref 70–99)
Phosphorus: 3.1 mg/dL (ref 2.3–4.6)
Potassium: 4.6 mEq/L (ref 3.5–5.1)
Sodium: 138 mEq/L (ref 135–145)

## 2021-12-31 LAB — LIPID PANEL
Cholesterol: 180 mg/dL (ref 0–200)
HDL: 60.7 mg/dL (ref >39.00)
LDL Cholesterol: 96 mg/dL (ref 0–99)
NonHDL: 119.69
Total CHOL/HDL Ratio: 3
Triglycerides: 118 mg/dL (ref 0.0–149.0)
VLDL: 23.6 mg/dL (ref 0.0–40.0)

## 2021-12-31 LAB — CBC
HCT: 39.6 % (ref 39.0–52.0)
Hemoglobin: 13.2 g/dL (ref 13.0–17.0)
MCHC: 33.3 g/dL (ref 30.0–36.0)
MCV: 89.9 fl (ref 78.0–100.0)
Platelets: 247 10*3/uL (ref 150.0–400.0)
RBC: 4.41 Mil/uL (ref 4.22–5.81)
RDW: 14.7 % (ref 11.5–15.5)
WBC: 4.5 10*3/uL (ref 4.0–10.5)

## 2021-12-31 LAB — HEPATIC FUNCTION PANEL
ALT: 16 U/L (ref 0–53)
AST: 20 U/L (ref 0–37)
Albumin: 4.8 g/dL (ref 3.5–5.2)
Alkaline Phosphatase: 53 U/L (ref 39–117)
Bilirubin, Direct: 0.1 mg/dL (ref 0.0–0.3)
Total Bilirubin: 0.5 mg/dL (ref 0.2–1.2)
Total Protein: 7.2 g/dL (ref 6.0–8.3)

## 2021-12-31 LAB — HM DIABETES FOOT EXAM

## 2021-12-31 LAB — HEMOGLOBIN A1C: Hgb A1c MFr Bld: 6.3 % (ref 4.6–6.5)

## 2021-12-31 MED ORDER — SILDENAFIL CITRATE 20 MG PO TABS: 60.0000 mg | ORAL_TABLET | Freq: Every day | ORAL | 11 refills | Status: DC | PRN

## 2021-12-31 NOTE — Progress Notes (Signed)
Subjective:    Patient ID: Ricardo Reese, male    DOB: [birth year], 71 y.o.   MRN: [id number]  HPI Here with wife for Medicare wellness visit and follow up of chronic health conditions Reviewed form and advanced directives Reviewed other doctors Some alcohol on weekends---beer No tobacco Vision is okay Hearing is fine No falls No depression or anhedonia Still exercises--walks golf course mostly---still works as started at [employer] with instrumental ADLs No memory problems  Left foot is "slapping down when I walk" Goes back almost 2 months Tore right hamstring--wondered if it was due to overcompensating Got cortisone injection in left hip bursa--before this happened Causes him to trip  Not testing sugars---have been good No foot numbness or burning Overdue for eye exam  No chest pain No SOB No change in exercise tolerance---limited by knees (but actually more walking downhill) No dizziness or syncope No edema  Current Outpatient Medications on File Prior to Visit  Medication Sig Dispense Refill   amLODipine (NORVASC) 5 MG tablet TAKE 1 TABLET BY MOUTH EVERY DAY 90 tablet 3   ammonium lactate (AMLACTIN) 12 % lotion Apply 1 application topically as needed for dry skin. 400 g 1   B Complex-C (SUPER B COMPLEX PO) Take 1 tablet by mouth daily.      cholecalciferol (VITAMIN D3) 25 MCG (1000 UNIT) tablet Take 1,000 Units by mouth daily.     fish oil-omega-3 fatty acids 1000 MG capsule Take 1 g by mouth daily.     metFORMIN (GLUCOPHAGE) 500 MG tablet TAKE 1 TABLET BY MOUTH 2 TIMES DAILY WITH A MEAL. 180 tablet 3   methocarbamol (ROBAXIN) 750 MG tablet Take 1 tablet (750 mg total) by mouth every 8 (eight) hours as needed for muscle spasms. 60 tablet 1   pravastatin (PRAVACHOL) 40 MG tablet TAKE 1 TABLET BY MOUTH EVERY DAY 90 tablet 3   sildenafil (REVATIO) 20 MG tablet Take 3-5 tablets (60-100 mg total) by mouth daily as needed. 50 tablet 11    valsartan-hydrochlorothiazide (DIOVAN-HCT) 320-25 MG tablet TAKE 1 TABLET BY MOUTH EVERY DAY 90 tablet 3   No current facility-administered medications on file prior to visit.    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    Past Surgical History:  Procedure Laterality Date   KNEE SURGERY  1969   cartilage removal right knee    melanoma back  09/2010   PROSTATE BIOPSY     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   Procedure: LEFT TOTAL HIP ARTHROPLASTY ANTERIOR APPROACH;  Surgeon: Mcarthur Rossetti, MD;  Location: [place];  Service: Orthopedics;  Laterality: Left;    Family History  Problem Relation Age of Onset   Stroke Father    Lung cancer Father    Prostate cancer Brother    Heart disease Brother    Diabetes Neg Hx    Colon cancer Neg Hx    Stomach  cancer Neg Hx    Esophageal cancer Neg Hx    Rectal cancer Neg Hx     Social History   Socioeconomic History   Marital status: Married    Spouse name: [name]   Number of children: 2   Years of education: Not on file   Highest education level: Not on file  Occupational History   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  Tobacco Use   Smoking status: Never    Passive exposure: Past   Smokeless tobacco: Never  Vaping Use   Vaping Use: Never used  Substance and Sexual Activity   Alcohol use: Yes    Alcohol/week: 3.0 - 4.0 standard drinks of alcohol    Types: 3 - 4 Cans of beer per week    Comment: occasional beer   Drug use: No   Sexual activity: Yes   Other Topics Concern   Not on file  Social History Narrative   Has living will   Wife is health care POA--- daughters next   Would accept resuscitation   No tube feeds if cognitively unaware   Social Determinants of Health   Financial Resource Strain: Low Risk  (02/03/2021)   Overall Financial Resource Strain (CARDIA)    Difficulty of Paying Living Expenses: Not hard at all  Food Insecurity: Not on file  Transportation Needs: No Transportation Needs (07/18/2018)   PRAPARE - Hydrologist (Medical): No    Lack of Transportation (Non-Medical): No  Physical Activity: Not on file  Stress: Not on file  Social Connections: Not on file  Intimate Partner Violence: Not on file   Review of Systems Eats okay but appetite is less.  Weight is stable Sleeps okay Wears seat belt Teeth are fine--regular with dentist No heartburn or dysphagia Bowels are fine--no blood Voids with good stream. No nocturia Just had derm visit---had some cryotherapy    Objective:   Physical Exam Constitutional:      Appearance: Normal appearance.  HENT:     Mouth/Throat:     Comments: No lesions Cardiovascular:     Rate and Rhythm: Normal rate and regular rhythm.     Pulses: Normal pulses.     Heart sounds:     No gallop.     Comments: Soft aortic systolic murmur Pulmonary:     Effort: Pulmonary effort is normal.     Breath sounds: Normal breath sounds. No wheezing or rales.  Abdominal:     Palpations: Abdomen is soft.     Tenderness: There is no abdominal tenderness.  Musculoskeletal:     Cervical back: Neck supple.     Right lower leg: No edema.     Left lower leg: No edema.  Lymphadenopathy:     Cervical: No cervical adenopathy.  Skin:    Findings: No lesion or rash.     Comments: No foot lesions  Neurological:     General: No focal deficit present.     Mental Status: He is alert and oriented to person, place, and time.     Comments: Mini-cog--normal clock.  Recall 1/3 Normal sensation in feet Almost no left foot dorsiflexion  Psychiatric:        Mood and Affect: Mood normal.        Behavior: Behavior normal.            Assessment & Plan:

## 2021-12-31 NOTE — Assessment & Plan Note (Signed)
Will set up with neurology

## 2021-12-31 NOTE — Assessment & Plan Note (Signed)
Seems to still have good control on the metformin 500 bid Is on statin

## 2021-12-31 NOTE — Addendum Note (Signed)
Addended by: Ellamae Sia on: 12/31/2021 02:27 PM   Modules accepted: Orders

## 2021-12-31 NOTE — Assessment & Plan Note (Signed)
I have personally reviewed the Medicare Annual Wellness questionnaire and have noted 1. The patient's medical and social history 2. Their use of alcohol, tobacco or illicit drugs 3. Their current medications and supplements 4. The patient's functional ability including ADL's, fall risks, home safety risks and hearing or visual             impairment. 5. Diet and physical activities 6. Evidence for depression or mood disorders  The patients weight, height, BMI and visual acuity have been recorded in the chart I have made referrals, counseling and provided education to the patient based review of the above and I have provided the pt with a written personalized care plan for preventive services.  I have provided you with a copy of your personalized plan for preventive services. Please take the time to review along with your updated medication list.  Walks regularly--discussed resistance work Colon due now--contacted Dr Carlean Purl about this PSA yearly at urologist shingrix at [place] and flu vaccines this fall

## 2021-12-31 NOTE — Progress Notes (Signed)
Vision Screening   Right eye Left eye Both eyes  Without correction     With correction '20/20 20/25 20/25 '$  Hearing Screening - Comments:: Passed whisper test

## 2021-12-31 NOTE — Assessment & Plan Note (Signed)
BP Readings from Last 3 Encounters:  12/31/21 120/74  11/22/21 126/66  11/14/21 (!) 148/67   Good control on amlodipine '5mg'$  and valsartan/HCTZ 320/25 Due for labs

## 2021-12-31 NOTE — Assessment & Plan Note (Signed)
   No further testing needed.

## 2022-01-03 ENCOUNTER — Telehealth: Payer: Self-pay

## 2022-01-03 ENCOUNTER — Other Ambulatory Visit: Payer: Medicare Other

## 2022-01-03 DIAGNOSIS — E1159 Type 2 diabetes mellitus with other circulatory complications: Secondary | ICD-10-CM

## 2022-01-03 LAB — MICROALBUMIN / CREATININE URINE RATIO
Creatinine,U: 102 mg/dL
Microalb Creat Ratio: 2.5 mg/g (ref 0.0–30.0)
Microalb, Ur: 2.5 mg/dL — ABNORMAL HIGH (ref 0.0–1.9)

## 2022-01-03 NOTE — Telephone Encounter (Unsigned)
Left message for pt to call back  °

## 2022-01-03 NOTE — Telephone Encounter (Signed)
-----   Message from Gatha Mayer, MD sent at 12/31/2021  5:51 PM EDT ----- Remo Lipps,  Please arrange a direct colonoscopy for screening thanks  [facility] ----- Message ----- From: Venia Carbon, MD Sent: 12/31/2021   8:18 AM EDT To: Gatha Mayer, MD  Glendell Docker, He was due in May for colon recall. Can you have your staff schedule him? Rich

## 2022-01-04 NOTE — Telephone Encounter (Signed)
Left message for pt to call back  °

## 2022-01-05 NOTE — Telephone Encounter (Signed)
Left message for pt to call back  °

## 2022-01-06 NOTE — Telephone Encounter (Signed)
Left message for pt to call back  °

## 2022-01-07 NOTE — Telephone Encounter (Signed)
Left message for pt to call back: My Chart message was sent to pt:  

## 2022-01-10 ENCOUNTER — Encounter: Payer: Self-pay | Admitting: Neurology

## 2022-01-24 ENCOUNTER — Other Ambulatory Visit: Payer: Self-pay | Admitting: Internal Medicine

## 2022-02-21 ENCOUNTER — Ambulatory Visit (INDEPENDENT_AMBULATORY_CARE_PROVIDER_SITE_OTHER): Payer: Medicare Other | Admitting: Neurology

## 2022-02-21 ENCOUNTER — Encounter: Payer: Self-pay | Admitting: Neurology

## 2022-02-21 VITALS — BP 151/68 | HR 63 | Ht 69.0 in | Wt 200.0 lb

## 2022-02-21 DIAGNOSIS — M21372 Foot drop, left foot: Secondary | ICD-10-CM

## 2022-02-21 NOTE — Progress Notes (Signed)
[facility] Clinic Note - Initial Visit   Date: 02/21/2022   KODEN HUNZEKER MRN: [id number] DOB: 16-Jun-1950   Dear Dr. Silvio Pate:  Thank you for your kind referral of Ishmail Mcmanamon Archbold for consultation of left foot drop. Although his history is well known to you, please allow [place] to reiterate it for the purpose of our medical record. The patient was accompanied to the clinic by self.    Aarik Blank Kidney is a 71 y.o. right-handed male with diabetic mellitus, hypertension, hyperlipidemia, and history of prostate cancer  presenting for evaluation of left foot drop.   IMPRESSION/PLAN: Left foot drop suggestive of L5 radiculopathy given inversion weakness, predominately motor involvement  - NCS/EMG of the left leg  - Start physical therapy for left foot strengthening  - If no improvement, next step is MRI lumbar spine  - Counseled on precaution when walking across thresholds/steps  Return to clinic in 3 months  ------------------------------------------------------------- History of present illness: Starting around May 2023, he began noticing that his left foot would slap the ground when walking.  He has not had any falls, but notices that the foot can catch at times. He denies numbness/tingling or back pain.  No radicular pain.  No weakness in the arms or right leg.  He admits to crossing his leg over the left knee.  No history of back surgery.   He worked for [employer] for 45 year in [place] and lived in [place].   Out-side paper records, electronic medical record, and images have been reviewed where available and summarized as:  Lab Results  Component Value Date   HGBA1C 6.3 12/31/2021   No results found for: "VITAMINB12" Lab Results  Component Value Date   TSH 0.78 01/04/2013   No results found for: "ESRSEDRATE", "POCTSEDRATE"  Past Medical History:  Diagnosis Date   Diabetes mellitus without complication ([place])    History of kidney stones    has  had twice   Hyperlipidemia    Hypertension    Impaired fasting glucose    Kidney stone 1983   Melanoma of back [facility]) 2013   Dr Evorn Gong   Pneumonia    walking back in 2002   Prostate cancer [facility])    Skin cancer 06/14/2011   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    Past Surgical History:  Procedure Laterality Date   KNEE SURGERY  1969   cartilage removal right knee    melanoma back  09/2010   PROSTATE BIOPSY     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   Procedure: LEFT TOTAL HIP ARTHROPLASTY ANTERIOR APPROACH;  Surgeon: Mcarthur Rossetti, MD;  Location: [facility];  Service: Orthopedics;  Laterality: Left;     Medications:  Outpatient Encounter Medications as of 02/21/2022  Medication Sig   amLODipine (NORVASC) 5 MG tablet TAKE 1 TABLET BY MOUTH EVERY DAY   ammonium lactate (AMLACTIN) 12 % lotion Apply 1 application topically as needed for dry skin.   B Complex-C (SUPER B COMPLEX PO) Take 1 tablet by mouth daily.    cholecalciferol (VITAMIN D3) 25 MCG (1000 UNIT) tablet Take 1,000 Units by mouth daily.   fish oil-omega-3 fatty acids 1000 MG capsule Take 1 g by mouth daily.   metFORMIN (GLUCOPHAGE) 500 MG tablet TAKE 1 TABLET BY MOUTH 2 TIMES DAILY WITH A MEAL.   methocarbamol (ROBAXIN) 750 MG tablet Take 1 tablet (750 mg total) by mouth every 8 (eight) hours  as needed for muscle spasms.   pravastatin (PRAVACHOL) 40 MG tablet TAKE 1 TABLET BY MOUTH EVERY DAY   sildenafil (REVATIO) 20 MG tablet Take 3-5 tablets (60-100 mg total) by mouth daily as needed.   valsartan-hydrochlorothiazide (DIOVAN-HCT) 320-25 MG tablet TAKE 1 TABLET BY MOUTH EVERY DAY   No facility-administered encounter medications on file as of 02/21/2022.    Allergies:  Allergies  Allergen Reactions   No Known Allergies     Family History: Family History  Problem Relation Age of Onset   Heart attack Mother    Stroke Father    Lung cancer Father    Prostate cancer Brother     Heart disease Brother    Diabetes Neg Hx    Colon cancer Neg Hx    Stomach cancer Neg Hx    Esophageal cancer Neg Hx    Rectal cancer Neg Hx     Social History: Social History   Tobacco Use   Smoking status: Never    Passive exposure: Past   Smokeless tobacco: Never  Vaping Use   Vaping Use: Never used  Substance Use Topics   Alcohol use: Yes    Alcohol/week: 3.0 - 4.0 standard drinks of alcohol    Types: 3 - 4 Cans of beer per week    Comment: occasional beer   Drug use: No   Social History   Social History Narrative   Has living will   Wife is health care POA--- daughters next   Would accept resuscitation   No tube feeds if cognitively unaware         Right Handed    Lives in a two story home     Vital Signs:  BP (!) 151/68   Pulse 63   Ht '5\' 9"'$  (1.753 m)   Wt 200 lb (90.7 kg)   SpO2 99%   BMI 29.53 kg/m    Neurological Exam: MENTAL STATUS including orientation to time, place, person, recent and remote memory, attention span and concentration, language, and fund of knowledge is normal.  Speech is not dysarthric.  CRANIAL NERVES: II:  No visual field defects.   III-IV-VI: Pupils equal round and reactive to light.  Normal conjugate, extra-ocular eye movements in all directions of gaze.  No nystagmus.  No ptosis.   V:  Normal facial sensation.    VII:  Normal facial symmetry and movements.   VIII:  Normal hearing and vestibular function.   IX-X:  Normal palatal movement.   XI:  Normal shoulder shrug and head rotation.   XII:  Normal tongue strength and range of motion, no deviation or fasciculation.  MOTOR:  No atrophy, fasciculations or abnormal movements.  No pronator drift.   Upper Extremity:  Right  Left  Deltoid  5/5   5/5   Biceps  5/5   5/5   Triceps  5/5   5/5   Infraspinatus 5/5  5/5  Medial pectoralis 5/5  5/5  Wrist extensors  5/5   5/5   Wrist flexors  5/5   5/5   Finger extensors  5/5   5/5   Finger flexors  5/5   5/5   Dorsal  interossei  5/5   5/5   Abductor pollicis  5/5   5/5   Tone (Ashworth scale)  0  0   Lower Extremity:  Right  Left  Hip flexors  5/5   5/5   Hip extensors  5/5   5/5   Adductor 5/5  5/5  Abductor 5/5  5/5  Knee flexors  5/5   5/5   Knee extensors  5/5   5/5   Inversion 5/5  4/5  Eversion 5/5  4/5  Dorsiflexors  5/5   3/5   Plantarflexors  5/5   5/5   Toe extensors  5/5   3/5   Toe flexors  5/5   5/5   Tone (Ashworth scale)  0  0   MSRs:  Right        Left                  brachioradialis 2+  2+  biceps 2+  2+  triceps 2+  2+  patellar 2+  0  ankle jerk 1+  1+  Hoffman no  no  plantar response down  down   SENSORY:  Normal and symmetric perception of light touch, pinprick, vibration, and proprioception.  Romberg's sign absent.   COORDINATION/GAIT: Normal finger-to- nose-finger.  Intact rapid alternating movements bilaterally.  Mild steppage gait on the left, stable, unassisted.  Unable to heel walk on the left foot.  Toe walking intact    Thank you for allowing me to participate in patient's care.  If I can answer any additional questions, I would be pleased to do so.    Sincerely,    Aramis Weil K. Posey Pronto, DO

## 2022-02-21 NOTE — Patient Instructions (Addendum)
Nerve testing of the left leg  Refer for out-patient physical therapy  Return to clinic in 3 months

## 2022-03-02 ENCOUNTER — Telehealth: Payer: Self-pay | Admitting: Neurology

## 2022-03-02 ENCOUNTER — Encounter: Payer: Self-pay | Admitting: Internal Medicine

## 2022-03-02 NOTE — Telephone Encounter (Signed)
Pt has not heard from [facility] for his PT ordered

## 2022-03-02 NOTE — Telephone Encounter (Signed)
  Called patient and left a detailed message per DPR that Patient may call to get scheduled:  [place] at [facility] [address] [place], [place], [place] [address]  ~15.9 mi [phone]

## 2022-03-21 ENCOUNTER — Ambulatory Visit (AMBULATORY_SURGERY_CENTER): Payer: Self-pay

## 2022-03-21 VITALS — Ht 69.0 in | Wt 196.0 lb

## 2022-03-21 DIAGNOSIS — Z1211 Encounter for screening for malignant neoplasm of colon: Secondary | ICD-10-CM

## 2022-03-21 NOTE — Progress Notes (Signed)
No egg or soy allergy known to patient  No issues known to pt with past sedation with any surgeries or procedures Patient denies ever being told they had issues or difficulty with intubation  No FH of Malignant Hyperthermia Pt is not on diet pills Pt is not on  home 02  Pt is not on blood thinners  Pt denies issues with constipation  No A fib or A flutter Have any cardiac testing pending--denied Pt instructed to use Single[employer]com or GoodRx for a price reduction on prep   

## 2022-03-22 ENCOUNTER — Telehealth: Payer: Self-pay | Admitting: Neurology

## 2022-03-22 DIAGNOSIS — M21372 Foot drop, left foot: Secondary | ICD-10-CM

## 2022-03-22 NOTE — Telephone Encounter (Signed)
Called patient and left a message for a call back. Need more information on where patient would like PT referral sent to.

## 2022-03-22 NOTE — Telephone Encounter (Signed)
Pt called in stating he spoke with [facility] for PT and they cannot do it until December or January. They suggested to [facility] in [place]. He would like a referral to be sent to them assuming they take his insurance.

## 2022-03-22 NOTE — Telephone Encounter (Signed)
Patients wife called back and a corrected referral was created and sent to [facility] outpatient rehab on [facility] per her and patients request. Provided patients wife with phone number [phone] to call and get scheduled.

## 2022-03-29 ENCOUNTER — Ambulatory Visit (INDEPENDENT_AMBULATORY_CARE_PROVIDER_SITE_OTHER): Payer: Medicare Other | Admitting: Neurology

## 2022-03-29 DIAGNOSIS — M5417 Radiculopathy, lumbosacral region: Secondary | ICD-10-CM

## 2022-03-29 DIAGNOSIS — M21372 Foot drop, left foot: Secondary | ICD-10-CM | POA: Diagnosis not present

## 2022-03-29 NOTE — Progress Notes (Addendum)
Follow-up Visit   Date: 03/29/2022    Ricardo Reese MRN: [id number] DOB: [birth year]    Ricardo Reese is a 71 y.o. right-handed Caucasian male with diabetes mellitus, hypertension, hyperlipidemia, and history of prostate cancer returning to the clinic for left foot drop.  The patient was accompanied to the clinic by self.  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 EMG results discussed with patient which shows lumbosacral radiculopathy, no evidence of peroneal neuropathy or peripheral neuropathy.  He denies low back pain or radiating pain down his legs.   He will be starting physical therapy tomorrow.  Recommend left AFO to prevent falls. If there is no improvement with PT, the next step is MRI lumbar spine wwo contrast (history of prostate cancer).  Patient will call with update in 4-6 weeks.  --------------------------------------------- History of present illness: Starting around May 2023, he began noticing that his left foot would slap the ground when walking.  He has not had any falls, but notices that the foot can catch at times. He denies numbness/tingling or back pain.  No radicular pain.  No weakness in the arms or right leg.  He admits to crossing his leg over the left knee.  No history of back surgery.   He worked for [employer] for 45 year in [place] and lived in [place].   UPDATE 03/29/2022:  He is here for EDX.  He reports ongoing weakness of the left foot, slightly better.  Physical therapy has been delayed due to rescheduling issues, and will be starting this tomorrow.  He denies back pain or leg pain.  No weakness in the right leg.   Medications:  Current Outpatient Medications on File Prior to Visit  Medication Sig Dispense Refill   amLODipine (NORVASC) 5 MG tablet TAKE 1 TABLET BY MOUTH EVERY DAY 90 tablet 3   ammonium lactate (AMLACTIN) 12 % lotion Apply  1 application topically as needed for dry skin. (Patient not taking: Reported on 03/21/2022) 400 g 1   B Complex-C (SUPER B COMPLEX PO) Take 1 tablet by mouth daily.      cholecalciferol (VITAMIN D3) 25 MCG (1000 UNIT) tablet Take 1,000 Units by mouth daily.     fish oil-omega-3 fatty acids 1000 MG capsule Take 1 g by mouth daily.     metFORMIN (GLUCOPHAGE) 500 MG tablet TAKE 1 TABLET BY MOUTH 2 TIMES DAILY WITH A MEAL. 180 tablet 3   methocarbamol (ROBAXIN) 750 MG tablet Take 1 tablet (750 mg total) by mouth every 8 (eight) hours as needed for muscle spasms. (Patient not taking: Reported on 03/21/2022) 60 tablet 1   pravastatin (PRAVACHOL) 40 MG tablet TAKE 1 TABLET BY MOUTH EVERY DAY 90 tablet 3   sildenafil (REVATIO) 20 MG tablet Take 3-5 tablets (60-100 mg total) by mouth daily as needed. 50 tablet 11   valsartan-hydrochlorothiazide (DIOVAN-HCT) 320-25 MG tablet TAKE 1 TABLET BY MOUTH EVERY DAY 90 tablet 3   No current facility-administered medications on file prior to visit.    Allergies:  Allergies  Allergen Reactions   No Known Allergies     Vital Signs:  There were no vitals taken for this visit.  Neurological Exam: Deferred  Data: NCS/EMG of the legs 03/29/2022: Chronic left L5-S1 radiculopathy, moderate. Chronic right L5-S1 and left L4 radiculopathy, mild. There is no evidence of a sensorimotor polyneuropathy with the affecting the left lower extremity.  Thank you for allowing me to participate in patient's care.  If I can answer any additional questions, I would be pleased to do so.    Sincerely,    Teja Judice K. Posey Pronto, DO

## 2022-03-29 NOTE — Procedures (Signed)
  [facility]  [address], [place]  [place], [place] [address] Tel: [phone] Fax: [phone] Test Date:  03/29/2022  Patient: Ricardo Reese DOB: [birth year] Physician: Narda Amber, DO  Sex: Male Height: '5\' 9"'$  Ref Phys: Narda Amber, DO  ID#: [id number]   Technician:    History: This is a 71 year old man referred for evaluation of left foot drop.  NCV & EMG Findings: Extensive electrodiagnostic testing of the left lower extremity and additional studies of the right shows:  Left sural and superficial peroneal sensory responses are within normal limits. Left peroneal and tibial motor responses are within normal limits. Chronic motor axonal loss changes are seen affecting the left L4-S1 and right L5-S1 myotomes, without accompanied active denervation.  Impression: Chronic left L5-S1 radiculopathy, moderate. Chronic right L5-S1 and left L4 radiculopathy, mild. There is no evidence of a sensorimotor polyneuropathy with the affecting the left lower extremity.   ___________________________ Narda Amber, DO    Nerve Conduction Studies   Stim Site NR Peak (ms) Norm Peak (ms) O-P Amp (V) Norm O-P Amp  Left Sup Peroneal Anti Sensory (Ant Lat Mall)  12 cm    2.2 <4.6 5.0 >3  Left Sural Anti Sensory (Lat Mall)  Calf    3.8 <4.6 5.2 >3     Stim Site NR Onset (ms) Norm Onset (ms) O-P Amp (mV) Norm O-P Amp Site1 Site2 Delta-0 (ms) Dist (cm) Vel (m/s) Norm Vel (m/s)  Left Peroneal Motor (Ext Dig Brev)  Ankle    4.5 <6.0 2.5 >2.5 B Fib Ankle 9.3 37.0 40 >40  B Fib    13.8  1.9  Poplt B Fib 1.9 8.0 42 >40  Poplt    15.7  1.9         Left Peroneal TA Motor (Tib Ant)  Fib Head    2.3 <4.5 4.5 >3 Poplit Fib Head 1.5 9.0 60 >40  Poplit    3.8 <5.7 3.9         Left Tibial Motor (Abd Hall Brev)  Ankle    5.3 <6.0 7.4 >4 Knee Ankle 10.7 44.0 41 >40  Knee    16.0  5.5          Electromyography   Side Muscle Ins.Act Fibs Fasc Recrt Amp Dur Poly Activation Comment   Right BicepsFemS Nml Nml Nml *1- *1+ *1+ *1+ Nml N/A  Right RectFemoris Nml Nml Nml Nml Nml Nml Nml Nml N/A  Right AntTibialis Nml Nml Nml *1- *1+ *1+ *1+ Nml N/A  Right Gastroc Nml Nml Nml *1- *1+ *1+ *1+ Nml N/A  Right GluteusMed Nml Nml Nml *1- *1+ *1+ *1+ Nml N/A  Left Gastroc Nml Nml Nml *2- *1+ *1+ *1+ Nml N/A  Left AntTibialis Nml Nml Nml *2- *1+ *1+ *1+ Nml N/A  Left Lumbo Parasp Low Nml Nml Nml Nml Nml Nml Nml Nml N/A  Left Flex Dig Long Nml Nml Nml *2- *1+ *1+ *1+ Nml N/A  Left AdductorLong Nml Nml Nml Nml Nml Nml Nml Nml N/A  Left RectFemoris Nml Nml Nml *1- *1+ *1+ *1+ Nml N/A  Left GluteusMed Nml Nml Nml *1- *1+ *1+ *1+ Nml N/A      Waveforms:

## 2022-03-30 ENCOUNTER — Telehealth: Payer: Self-pay | Admitting: Physical Therapy

## 2022-03-30 ENCOUNTER — Ambulatory Visit: Payer: Medicare Other | Attending: Neurology | Admitting: Physical Therapy

## 2022-03-30 DIAGNOSIS — M5417 Radiculopathy, lumbosacral region: Secondary | ICD-10-CM | POA: Diagnosis not present

## 2022-03-30 DIAGNOSIS — R2689 Other abnormalities of gait and mobility: Secondary | ICD-10-CM | POA: Insufficient documentation

## 2022-03-30 DIAGNOSIS — M6281 Muscle weakness (generalized): Secondary | ICD-10-CM | POA: Insufficient documentation

## 2022-03-30 DIAGNOSIS — M21372 Foot drop, left foot: Secondary | ICD-10-CM | POA: Diagnosis not present

## 2022-03-30 NOTE — Therapy (Signed)
OUTPATIENT PHYSICAL THERAPY NEURO EVALUATION   Patient Name: Ricardo Reese MRN: [id number] DOB:1950-10-16, 71 y.o., male Today's Date: 03/30/2022   PCP: Venia Carbon, MD REFERRING PROVIDER: Alda Berthold, DO    PT End of Session - 03/30/22 0931     Visit Number 1    Number of Visits 9   with eval   Date for PT Re-Evaluation 05/11/22    Authorization Type Medicare    PT Start Time 0930    PT Stop Time 8850   evaluation   PT Time Calculation (min) 32 min    Activity Tolerance Patient tolerated treatment well    Behavior During Therapy [facility] for tasks assessed/performed             Past Medical History:  Diagnosis Date   Diabetes mellitus without complication ([place])    History of kidney stones    has had twice   Hyperlipidemia    Hypertension    Impaired fasting glucose    Kidney stone 1983   Melanoma of back [facility]) 2013   Dr Evorn Gong   Pneumonia    walking back in 2002   Prostate cancer ([place]) 2019   Skin cancer 06/14/2011   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   Past Surgical History:  Procedure Laterality Date   KNEE SURGERY  1969   cartilage removal right knee    melanoma back  09/2010   PROSTATE BIOPSY     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   Procedure: LEFT TOTAL HIP ARTHROPLASTY ANTERIOR APPROACH;  Surgeon: Mcarthur Rossetti, MD;  Location: [facility];  Service: Orthopedics;  Laterality: Left;   Patient Active Problem List   Diagnosis Date Noted   Left foot drop 12/31/2021   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   Right knee pain 10/25/2019   History of prostate cancer 01/02/2018   Advance directive discussed with patient 09/26/2016   Unilateral primary osteoarthritis, left hip 09/20/2016   Status post left hip replacement 09/20/2016   Routine general medical examination at a health care facility 08/17/2011   History of melanoma    Hyperlipemia 05/20/2008   Benign essential hypertension 05/20/2008   Type 2  diabetes mellitus with other circulatory complications ([place]) 27/74/1287    ONSET DATE: 03/22/2022   REFERRING DIAG: O67.672 (ICD-10-CM) - Left foot drop   THERAPY DIAG:  Muscle weakness (generalized)  Other abnormalities of gait and mobility  Foot drop, left  Radiculopathy of lumbosacral region  Rationale for Evaluation and Treatment Rehabilitation  SUBJECTIVE:  SUBJECTIVE STATEMENT: Pt reports he has had no pain, states his L foot drop started a few months ago. Pt reports he noticed his foot just started slapping down, has been causing him to trip but no falls. He reports is bothers him the most when walking downhill, he plays a lot of golf so notices it when out on the green.  Pt accompanied by: self  PERTINENT HISTORY: diabetic mellitus, hypertension, hyperlipidemia, and history of prostate cancer; s/p L THA 2018  PAIN:  Are you having pain? No  PRECAUTIONS: None  WEIGHT BEARING RESTRICTIONS No  FALLS: Has patient fallen in last 6 months? No  LIVING ENVIRONMENT: Lives with: lives with their spouse Lives in: House/apartment Stairs: Yes: Internal: 15 steps; can reach both and External: 15 steps; can reach both Has following equipment at home: Single point cane and Walker - 2 wheeled  PLOF: Independent  PATIENT GOALS "I don't want my foot to slap anymore, it's an annoyance"  OBJECTIVE:   DIAGNOSTIC FINDINGS: None related to current issue  COGNITION: Overall cognitive status: Within functional limits for tasks assessed   SENSATION: Light touch: WFL Proprioception: Impaired  in distal LLE  COORDINATION: WFL BLE  POSTURE: rounded shoulders  LOWER EXTREMITY MMT:    MMT Right Eval Left Eval  Hip flexion 5 5  Hip extension    Hip abduction    Hip adduction    Hip  internal rotation    Hip external rotation    Knee flexion 5 5  Knee extension 5 5  Ankle dorsiflexion 5 2-  Ankle plantarflexion    Ankle inversion    Ankle eversion    (Blank rows = not tested)  BED MOBILITY:  WFL, independent  TRANSFERS: Assistive device utilized: None  Sit to stand: Complete Independence Stand to sit: Complete Independence Chair to chair: Complete Independence Floor:  not assessed at eval  STAIRS:  Level of Assistance: Modified independence  Stair Negotiation Technique: Alternating Pattern  with Single Rail on Right  Number of Stairs: 4   Height of Stairs: 6  Comments: L foot slap, more noticeable with descent  GAIT: Gait pattern: decreased stride length, decreased ankle dorsiflexion- Left, Left foot flat, and poor foot clearance- Left Distance walked: 115 ft Assistive device utilized: None Level of assistance: Modified independence Comments: increased time needed to complete distance, decreased gait speed due to L foot slap/foot drop  FUNCTIONAL TESTs:    [facility] PT Assessment - 03/30/22 0946       Ambulation/Gait   Gait velocity 32.8 ft over 11.31 sec = 2.9 ft/sec      Standardized Balance Assessment   Standardized Balance Assessment Timed Up and Go Test;Five Times Sit to Stand    Five times sit to stand comments  12.16 sec      Timed Up and Go Test   TUG Normal TUG    Normal TUG (seconds) 8.6   no AD     Functional Gait  Assessment   Gait assessed  Yes    Gait Level Surface Walks 20 ft in less than 7 sec but greater than 5.5 sec, uses assistive device, slower speed, mild gait deviations, or deviates 6-10 in outside of the 12 in walkway width.    Change in Gait Speed Able to change speed, demonstrates mild gait deviations, deviates 6-10 in outside of the 12 in walkway width, or no gait deviations, unable to achieve a major change in velocity, or uses a change in velocity, or uses an  assistive device.    Gait with Horizontal Head Turns Performs  head turns smoothly with slight change in gait velocity (eg, minor disruption to smooth gait path), deviates 6-10 in outside 12 in walkway width, or uses an assistive device.    Gait with Vertical Head Turns Performs task with slight change in gait velocity (eg, minor disruption to smooth gait path), deviates 6 - 10 in outside 12 in walkway width or uses assistive device    Gait and Pivot Turn Pivot turns safely within 3 sec and stops quickly with no loss of balance.    Step Over Obstacle Is able to step over one shoe box (4.5 in total height) without changing gait speed. No evidence of imbalance.    Gait with Narrow Base of Support Ambulates 7-9 steps.    Gait with Eyes Closed Walks 20 ft, uses assistive device, slower speed, mild gait deviations, deviates 6-10 in outside 12 in walkway width. Ambulates 20 ft in less than 9 sec but greater than 7 sec.    Ambulating Backwards Walks 20 ft, uses assistive device, slower speed, mild gait deviations, deviates 6-10 in outside 12 in walkway width.    Steps Alternating feet, must use rail.    Total Score 21    FGA comment: medium fall risk             TODAY'S TREATMENT:  PT evaluation   PATIENT EDUCATION: Education details: Eval findings, PT POC, OM results and functional implications Person educated: Patient Education method: Explanation Education comprehension: verbalized understanding   HOME EXERCISE PROGRAM: To be established next session   GOALS: Goals reviewed with patient? Yes  SHORT TERM GOALS=LONG TERM GOALS DUE TO LENGTH OF POC  LONG TERM GOALS: Target date: 05/11/2022  Pt will be independent with final HEP for improved strength, balance, transfers and gait. Baseline:  Goal status: INITIAL  2.  Pt will improve gait velocity to at least 3.5 ft/sec for improved gait efficiency and performance at mod I to Independent level  Baseline: 2.9 ft/sec no AD Goal status: INITIAL  3.  Pt will improve FGA to 27/30 for decreased  fall risk  Baseline: 21/30 Goal status: INITIAL  4.  Pt will trial various AFO's and LE bracing options to decrease fall risk Baseline:  Goal status: INITIAL   ASSESSMENT:  CLINICAL IMPRESSION: Patient is a 71 year old male referred to Neuro OPPT for L foot drop.   Pt's PMH is significant for: diabetic mellitus, hypertension, hyperlipidemia, and history of prostate cancer who is s/p L THA in 2018.  The following deficits were present during the exam: decreased gait speed, decreased dynamic standing balance, increased fall risk decreased L DF strength, decreased proprioception in LLE, and L foot drop. Based on his gait speed of 2.9 ft/sec, FGA score of 21/30, and distal LLE weakness and sensory impairments, pt is an increased risk for falls. Pt would benefit from skilled PT to address these impairments and functional limitations to maximize functional mobility independence.  OBJECTIVE IMPAIRMENTS Abnormal gait, decreased balance, decreased mobility, difficulty walking, decreased strength, impaired perceived functional ability, and impaired sensation.   ACTIVITY LIMITATIONS stairs  PARTICIPATION LIMITATIONS: community activity and golfing  PERSONAL FACTORS 1-2 comorbidities:    diabetic mellitus, hypertension, hyperlipidemia, and history of prostate cancer who is s/p L THA in 2018are also affecting patient's functional outcome.   REHAB POTENTIAL: Excellent  CLINICAL DECISION MAKING: Stable/uncomplicated  EVALUATION COMPLEXITY: Low  PLAN: PT FREQUENCY: 2x/week  PT DURATION: 6 weeks (  9 visits)  PLANNED INTERVENTIONS: Therapeutic exercises, Therapeutic activity, Neuromuscular re-education, Balance training, Gait training, Patient/Family education, Self Care, Joint mobilization, Joint manipulation, Stair training, Orthotic/Fit training, DME instructions, Aquatic Therapy, Dry Needling, Electrical stimulation, Spinal manipulation, Spinal mobilization, Cryotherapy, Moist heat, Taping,  Biofeedback, Manual therapy, and Re-evaluation  PLAN FOR NEXT SESSION: AFO assessment (did pt wear tennis shoes with laces?), initiate HEP for L DF strengthening, estim   Excell Seltzer, PT, DPT, [facility] 03/30/2022, 10:04 AM

## 2022-03-30 NOTE — Telephone Encounter (Signed)
Dr. Posey Pronto,  [name] is being treated by physical therapy for L foot drop.  Mr. Deeb will benefit from use of a Left AFO in order to improve safety with functional mobility.    If you agree, please submit request in EPIC under MD Order, Other Orders (list Left AFO in comments) or fax to [facility] at [phone]. Also, please addend your latest visit note to include a recommendation for a Left AFO due to foot drop.  Thank you, Excell Seltzer, PT, DPT, [facility] [address] [place], [place]  [address] Phone:  [phone] Fax:  [phone]

## 2022-04-01 MED ORDER — MISC. DEVICES MISC: 0 refills | Status: DC

## 2022-04-04 ENCOUNTER — Ambulatory Visit: Payer: Medicare Other | Admitting: Physical Therapy

## 2022-04-04 DIAGNOSIS — M21372 Foot drop, left foot: Secondary | ICD-10-CM | POA: Diagnosis not present

## 2022-04-04 DIAGNOSIS — R2689 Other abnormalities of gait and mobility: Secondary | ICD-10-CM

## 2022-04-04 DIAGNOSIS — M6281 Muscle weakness (generalized): Secondary | ICD-10-CM | POA: Diagnosis not present

## 2022-04-04 DIAGNOSIS — M5417 Radiculopathy, lumbosacral region: Secondary | ICD-10-CM | POA: Diagnosis not present

## 2022-04-04 NOTE — Therapy (Signed)
OUTPATIENT PHYSICAL THERAPY NEURO TREATMENT   Patient Name: Ricardo Reese MRN: [id number] DOB:09-19-1950, 71 y.o., male Today's Date: 04/04/2022   PCP: Venia Carbon, MD REFERRING PROVIDER: Alda Berthold, DO    PT End of Session - 04/04/22 0848     Visit Number 2    Number of Visits 9   with eval   Date for PT Re-Evaluation 05/11/22    Authorization Type Medicare    PT Start Time 9794    PT Stop Time 0927    PT Time Calculation (min) 40 min    Equipment Utilized During Treatment Other (comment)   L AFO   Activity Tolerance Patient tolerated treatment well    Behavior During Therapy [facility] for tasks assessed/performed              Past Medical History:  Diagnosis Date   Diabetes mellitus without complication ([place])    History of kidney stones    has had twice   Hyperlipidemia    Hypertension    Impaired fasting glucose    Kidney stone 1983   Melanoma of back [facility]) 2013   Dr Evorn Gong   Pneumonia    walking back in 2002   Prostate cancer ([place]) 2019   Skin cancer 06/14/2011   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   Past Surgical History:  Procedure Laterality Date   KNEE SURGERY  1969   cartilage removal right knee    melanoma back  09/2010   PROSTATE BIOPSY     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   Procedure: LEFT TOTAL HIP ARTHROPLASTY ANTERIOR APPROACH;  Surgeon: Mcarthur Rossetti, MD;  Location: [place];  Service: Orthopedics;  Laterality: Left;   Patient Active Problem List   Diagnosis Date Noted   Left foot drop 12/31/2021   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   Right knee pain 10/25/2019   History of prostate cancer 01/02/2018   Advance directive discussed with patient 09/26/2016   Unilateral primary osteoarthritis, left hip 09/20/2016   Status post left hip replacement 09/20/2016   Routine general medical examination at a health care facility 08/17/2011   History of melanoma    Hyperlipemia 05/20/2008    Benign essential hypertension 05/20/2008   Type 2 diabetes mellitus with other circulatory complications ([place]) 80/16/5537    ONSET DATE: 03/22/2022   REFERRING DIAG: S82.707 (ICD-10-CM) - Left foot drop   THERAPY DIAG:  Foot drop, left  Muscle weakness (generalized)  Other abnormalities of gait and mobility  Rationale for Evaluation and Treatment Rehabilitation  SUBJECTIVE:  SUBJECTIVE STATEMENT: Pt reports things are about the same, no falls.   Pt accompanied by: self  PERTINENT HISTORY: diabetic mellitus, hypertension, hyperlipidemia, and history of prostate cancer; s/p L THA 2018  PAIN:  Are you having pain? No  PLOF: Independent  PATIENT GOALS "I don't want my foot to slap anymore, it's an annoyance"   OBJECTIVE:  TODAY'S TREATMENT:  Gait Training  Gait pattern: step through pattern, decreased stride length, decreased hip/knee flexion- Left, scissoring, and poor foot clearance- Left Distance walked: 200' plus various clinic distances Assistive device utilized:  L [place] walk-on AFO (medial strut) Level of assistance: Modified independence Comments: No instability noted and pt reported satisfaction w/AFO. Noted occasional decreased step clearance w/LLE   The following treadmill training was completed for aerobic/neural priming, endurance, AFO training and gait speed.   -In forward direction:  Warmup: 2:00 up to 1.5 mph w/BUE support. Min cues to maintain close distance to treadmill as pt tends to step feet too far backward  Every 1 minute for 6 minutes, increased incline by 1% until reaching 6% for improved step clearance w/LLE and practice ambulating on green w/AFO. Pt able to clear his LLE at 6% grade, but demonstrated forward flexed posture with heavy reliance on his BUEs  throughout.   -In retro direction  5 minutes at 1.0 mph increasing incline by 2% every minute until reaching 6% for improved eccentric control of LLE and to imitate walking on green w/AFO. Pt tolerated well w/upright posture and no instability noted    Ther Ex  Established initial HEP for eccentric anterior tib strength (see bolded below)    GAIT: Gait pattern: decreased stride length, decreased ankle dorsiflexion- Left, Left foot flat, and poor foot clearance- Left Distance walked: Various clinic distances  Assistive device utilized: None Level of assistance: Modified independence Comments: increased time needed to complete distance, decreased gait speed due to L foot slap/foot drop   PATIENT EDUCATION: Education details: Wearing laced tennis shoes next session, initial HEP  Person educated: Patient Education method: Explanation and Handouts Education comprehension: verbalized understanding   HOME EXERCISE PROGRAM: Access Code: [email or web address] URL: [email or web address] Date: 04/04/2022 Prepared by: Mickie Bail Markies Mowatt  Exercises - Heel Walking with Counter Support  - 1 x daily - 7 x weekly - 3 sets - 10 reps - Toe Walking with Counter Support  - 1 x daily - 7 x weekly - 3 sets - 10 reps - Eccentric Heel Lowering on Step  - 1 x daily - 7 x weekly - 3 sets - 10 reps   GOALS: Goals reviewed with patient? Yes  SHORT TERM GOALS=LONG TERM GOALS DUE TO LENGTH OF POC  LONG TERM GOALS: Target date: 05/11/2022  Pt will be independent with final HEP for improved strength, balance, transfers and gait. Baseline:  Goal status: INITIAL  2.  Pt will improve gait velocity to at least 3.5 ft/sec for improved gait efficiency and performance at mod I to Independent level  Baseline: 2.9 ft/sec no AD Goal status: INITIAL  3.  Pt will improve FGA to 27/30 for decreased fall risk  Baseline: 21/30 Goal status: INITIAL  4.  Pt will trial various AFO's and LE bracing options to  decrease fall risk Baseline:  Goal status: INITIAL   ASSESSMENT:  CLINICAL IMPRESSION: Emphasis of skilled PT session on gait training w/L AFO and establishing initial HEP. Pt did not wear lace-up shoes to session, so unable to assess foot-up brace, but pt verbalized contempt w/AFO. Noted minor scissoring  of LLE while on treadmill, but no LOB throughout session. Continue POC.   OBJECTIVE IMPAIRMENTS Abnormal gait, decreased balance, decreased mobility, difficulty walking, decreased strength, impaired perceived functional ability, and impaired sensation.   ACTIVITY LIMITATIONS stairs  PARTICIPATION LIMITATIONS: community activity and golfing  PERSONAL FACTORS 1-2 comorbidities:    diabetic mellitus, hypertension, hyperlipidemia, and history of prostate cancer who is s/p L THA in 2018are also affecting patient's functional outcome.   REHAB POTENTIAL: Excellent  CLINICAL DECISION MAKING: Stable/uncomplicated  EVALUATION COMPLEXITY: Low  PLAN: PT FREQUENCY: 2x/week  PT DURATION: 6 weeks (9 visits)  PLANNED INTERVENTIONS: Therapeutic exercises, Therapeutic activity, Neuromuscular re-education, Balance training, Gait training, Patient/Family education, Self Care, Joint mobilization, Joint manipulation, Stair training, Orthotic/Fit training, DME instructions, Aquatic Therapy, Dry Needling, Electrical stimulation, Spinal manipulation, Spinal mobilization, Cryotherapy, Moist heat, Taping, Biofeedback, Manual therapy, and Re-evaluation  PLAN FOR NEXT SESSION: AFO/foot-up assessment (did pt wear tennis shoes with laces?), add to HEP for L DF strengthening, estim, he did well w/L [place] w/medial strut    Cruzita Lederer Gianni Fuchs, PT, DPT 04/04/2022, 9:43 AM

## 2022-04-06 ENCOUNTER — Ambulatory Visit: Payer: Medicare Other

## 2022-04-06 ENCOUNTER — Telehealth: Payer: Self-pay | Admitting: Physical Therapy

## 2022-04-06 DIAGNOSIS — M21372 Foot drop, left foot: Secondary | ICD-10-CM

## 2022-04-06 NOTE — Telephone Encounter (Signed)
Ordered placed

## 2022-04-06 NOTE — Telephone Encounter (Signed)
Hi Mahina,  I was unable to reply to your original message but I wanted to let you know I am unable to see the Left AFO order that you placed for Mr. Strayer.  Are you able to go under "MD Order", "Other Orders" and list "left AFO" in the comments. Let me know if you have any further questions!  Thank you, Excell Seltzer, PT, DPT, [facility] [address] [place], [place]  [address] Phone:  [phone] Fax:  [phone]

## 2022-04-07 ENCOUNTER — Telehealth: Payer: Self-pay | Admitting: Physical Therapy

## 2022-04-07 NOTE — Telephone Encounter (Signed)
Dr. Posey Pronto,  Thank you so much for the Left AFO order for [name]. Do you also mind addending your latest visit note with him to include this recommendation for a Left AFO so we can justify the need to his insurance? Thank you!  Excell Seltzer, PT, DPT, [facility] [address] [place], [place]  [address] Phone:  [phone] Fax:  [phone]

## 2022-04-08 NOTE — Telephone Encounter (Signed)
Done

## 2022-04-11 ENCOUNTER — Ambulatory Visit: Payer: Medicare Other | Admitting: Physical Therapy

## 2022-04-11 DIAGNOSIS — M6281 Muscle weakness (generalized): Secondary | ICD-10-CM | POA: Diagnosis not present

## 2022-04-11 DIAGNOSIS — M21372 Foot drop, left foot: Secondary | ICD-10-CM | POA: Diagnosis not present

## 2022-04-11 DIAGNOSIS — M5417 Radiculopathy, lumbosacral region: Secondary | ICD-10-CM

## 2022-04-11 DIAGNOSIS — R2689 Other abnormalities of gait and mobility: Secondary | ICD-10-CM

## 2022-04-11 NOTE — Therapy (Signed)
OUTPATIENT PHYSICAL THERAPY NEURO TREATMENT   Patient Name: Ricardo Reese MRN: [id number] DOB:[birth year], 71 y.o., male Today's Date: 04/11/2022   PCP: Venia Carbon, MD REFERRING PROVIDER: Alda Berthold, DO    PT End of Session - 04/11/22 1100     Visit Number 3    Number of Visits 9   with eval   Date for PT Re-Evaluation 05/11/22    Authorization Type Medicare    PT Start Time 1100    PT Stop Time 1140    PT Time Calculation (min) 40 min    Equipment Utilized During Treatment Other (comment)   L AFO, L foot up brace   Activity Tolerance Patient tolerated treatment well    Behavior During Therapy WFL for tasks assessed/performed               Past Medical History:  Diagnosis Date   Diabetes mellitus without complication ([place])    History of kidney stones    has had twice   Hyperlipidemia    Hypertension    Impaired fasting glucose    Kidney stone 1983   Melanoma of back [facility]) 2013   Dr Evorn Gong   Pneumonia    walking back in 2002   Prostate cancer ([place]) 2019   Skin cancer 06/14/2011   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   Past Surgical History:  Procedure Laterality Date   KNEE SURGERY  1969   cartilage removal right knee    melanoma back  09/2010   PROSTATE BIOPSY     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   Procedure: LEFT TOTAL HIP ARTHROPLASTY ANTERIOR APPROACH;  Surgeon: Mcarthur Rossetti, MD;  Location: [place];  Service: Orthopedics;  Laterality: Left;   Patient Active Problem List   Diagnosis Date Noted   Left foot drop 12/31/2021   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   Right knee pain 10/25/2019   History of prostate cancer 01/02/2018   Advance directive discussed with patient 09/26/2016   Unilateral primary osteoarthritis, left hip 09/20/2016   Status post left hip replacement 09/20/2016   Routine general medical examination at a health care facility 08/17/2011   History of melanoma     Hyperlipemia 05/20/2008   Benign essential hypertension 05/20/2008   Type 2 diabetes mellitus with other circulatory complications ([place]) 29/92/4268    ONSET DATE: 03/22/2022   REFERRING DIAG: T41.962 (ICD-10-CM) - Left foot drop   THERAPY DIAG:  Foot drop, left  Muscle weakness (generalized)  Other abnormalities of gait and mobility  Radiculopathy of lumbosacral region  Rationale for Evaluation and Treatment Rehabilitation  SUBJECTIVE:  SUBJECTIVE STATEMENT: Pt reports he has been doing well, was able to golf some last week. No pain and no falls. Pt also reports he has been doing his HEP 5-7 times per day and feels he has noticed more strength in his LLE.  Pt accompanied by: self  PERTINENT HISTORY: diabetic mellitus, hypertension, hyperlipidemia, and history of prostate cancer; s/p L THA 2018  PAIN:  Are you having pain? No  PLOF: Independent  PATIENT GOALS "I don't want my foot to slap anymore, it's an annoyance"   OBJECTIVE:  TODAY'S TREATMENT:   GAIT TRAINING:  Gait pattern: L foot slap and decreased ankle dorsiflexion- Left Distance walked: 230 ft Assistive device utilized: None Level of assistance: Complete Independence Comments: no brace  Gait pattern:  improved L DF noted during gait, no foot slap Distance walked: 230 ft Assistive device utilized: None Level of assistance: Complete Independence Comments: with foot up brace  Gait pattern:  improved L DF but not as much as with foot up brace Distance walked: 230 ft Assistive device utilized: None Level of assistance: Complete Independence Comments: with L [place] PLS (medial strut)  Based on gait assessment with both L [place] PLS and with foot-up brace this date pt exhibits most improvement in gait mechanics and L  DF with use of foot-up brace. Measured L ankle circumference to be 9.5 inches. Recommended pt purchase a foot-up brace online size L, provided handout for where to purchase and emailed link to his wife as well.  Treadmill Training: Forwards: 1.4 mph with progression up to 4% grade with use of foot-up brace x 5 min Backwards: 0.9 mph with progression up to 5% grade with use of foot-up brace x 5 min Laterally to the R and L: 0.8 mph at 0% grade with use of foot-up brace x 2.5 min each direction Pt exhibits narrow BOS and some scissoring of his LE during gait, needs cues to "lead with heel" when sidestepping laterally on treadmill   PATIENT EDUCATION: Education details: continue HEP, where to purchase foot-up brace Person educated: Patient Education method: Explanation and Handouts Education comprehension: verbalized understanding   HOME EXERCISE PROGRAM: Access Code: [email or web address] URL: [email or web address] Date: 04/04/2022 Prepared by: Mickie Bail Plaster  Exercises - Heel Walking with Counter Support  - 1 x daily - 7 x weekly - 3 sets - 10 reps - Toe Walking with Counter Support  - 1 x daily - 7 x weekly - 3 sets - 10 reps - Eccentric Heel Lowering on Step  - 1 x daily - 7 x weekly - 3 sets - 10 reps   GOALS: Goals reviewed with patient? Yes  SHORT TERM GOALS=LONG TERM GOALS DUE TO LENGTH OF POC  LONG TERM GOALS: Target date: 05/11/2022  Pt will be independent with final HEP for improved strength, balance, transfers and gait. Baseline:  Goal status: INITIAL  2.  Pt will improve gait velocity to at least 3.5 ft/sec for improved gait efficiency and performance at mod I to Independent level  Baseline: 2.9 ft/sec no AD Goal status: INITIAL  3.  Pt will improve FGA to 27/30 for decreased fall risk  Baseline: 21/30 Goal status: INITIAL  4.  Pt will trial various AFO's and LE bracing options to decrease fall risk Baseline:  Goal status:  INITIAL   ASSESSMENT:  CLINICAL IMPRESSION: Emphasis of skilled PT session on assessing gait with foot-up brace vs with L [place] PLS AFO as pt is wearing lace-up tennis shoes this date. Pt exhibits most  improvement and increase in L DF with use of foot-up brace vs with use of AFO. Recommending that pt purchase foot-up brace online, handout and link provided. Trialed use of this brace with multidirectional gait on treadmill ambulating forwards, backwards, and laterally. Pt continues to exhibit narrow BOS and scissoring of LE during gait and continues to benefit from skilled therapy services to address ongoing LLE weakness and decrease his gait impairments. Continue POC.   OBJECTIVE IMPAIRMENTS Abnormal gait, decreased balance, decreased mobility, difficulty walking, decreased strength, impaired perceived functional ability, and impaired sensation.   ACTIVITY LIMITATIONS stairs  PARTICIPATION LIMITATIONS: community activity and golfing  PERSONAL FACTORS 1-2 comorbidities:    diabetic mellitus, hypertension, hyperlipidemia, and history of prostate cancer who is s/p L THA in 2018are also affecting patient's functional outcome.   REHAB POTENTIAL: Excellent  CLINICAL DECISION MAKING: Stable/uncomplicated  EVALUATION COMPLEXITY: Low  PLAN: PT FREQUENCY: 2x/week  PT DURATION: 6 weeks (9 visits)  PLANNED INTERVENTIONS: Therapeutic exercises, Therapeutic activity, Neuromuscular re-education, Balance training, Gait training, Patient/Family education, Self Care, Joint mobilization, Joint manipulation, Stair training, Orthotic/Fit training, DME instructions, Aquatic Therapy, Dry Needling, Electrical stimulation, Spinal manipulation, Spinal mobilization, Cryotherapy, Moist heat, Taping, Biofeedback, Manual therapy, and Re-evaluation  PLAN FOR NEXT SESSION: did pt purchase foot up brace?, add to HEP for L DF strengthening, estim, hip abd strengthening (resisted sidesteps, monster walks)   Excell Seltzer, PT, DPT, CSRS 04/11/2022, 11:45 AM

## 2022-04-13 ENCOUNTER — Ambulatory Visit: Payer: Medicare Other | Attending: Neurology | Admitting: Physical Therapy

## 2022-04-13 DIAGNOSIS — M5417 Radiculopathy, lumbosacral region: Secondary | ICD-10-CM | POA: Insufficient documentation

## 2022-04-13 DIAGNOSIS — R2689 Other abnormalities of gait and mobility: Secondary | ICD-10-CM | POA: Diagnosis not present

## 2022-04-13 DIAGNOSIS — M6281 Muscle weakness (generalized): Secondary | ICD-10-CM | POA: Diagnosis not present

## 2022-04-13 DIAGNOSIS — M21372 Foot drop, left foot: Secondary | ICD-10-CM | POA: Diagnosis not present

## 2022-04-13 NOTE — Therapy (Signed)
OUTPATIENT PHYSICAL THERAPY NEURO TREATMENT   Patient Name: Ricardo Reese MRN: [id number] DOB:[birth year], 71 y.o., male Today's Date: 04/13/2022   PCP: Venia Carbon, MD REFERRING PROVIDER: Alda Berthold, DO    PT End of Session - 04/13/22 [phone]     Visit Number 4    Number of Visits 9   with eval   Date for PT Re-Evaluation 05/11/22    Authorization Type Medicare    PT Start Time 0936    PT Stop Time 1016    PT Time Calculation (min) 40 min    Equipment Utilized During Treatment Other (comment)   L AFO, L foot up brace   Activity Tolerance Patient tolerated treatment well    Behavior During Therapy [facility] for tasks assessed/performed                Past Medical History:  Diagnosis Date   Diabetes mellitus without complication ([place])    History of kidney stones    has had twice   Hyperlipidemia    Hypertension    Impaired fasting glucose    Kidney stone 1983   Melanoma of back [facility]) 2013   Dr Evorn Gong   Pneumonia    walking back in 2002   Prostate cancer ([place]) 2019   Skin cancer 06/14/2011   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   Past Surgical History:  Procedure Laterality Date   KNEE SURGERY  1969   cartilage removal right knee    melanoma back  09/2010   PROSTATE BIOPSY     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   Procedure: LEFT TOTAL HIP ARTHROPLASTY ANTERIOR APPROACH;  Surgeon: Mcarthur Rossetti, MD;  Location: [place];  Service: Orthopedics;  Laterality: Left;   Patient Active Problem List   Diagnosis Date Noted   Left foot drop 12/31/2021   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   Right knee pain 10/25/2019   History of prostate cancer 01/02/2018   Advance directive discussed with patient 09/26/2016   Unilateral primary osteoarthritis, left hip 09/20/2016   Status post left hip replacement 09/20/2016   Routine general medical examination at a health care facility 08/17/2011   History of melanoma     Hyperlipemia 05/20/2008   Benign essential hypertension 05/20/2008   Type 2 diabetes mellitus with other circulatory complications ([place]) 67/34/1937    ONSET DATE: 03/22/2022   REFERRING DIAG: T02.409 (ICD-10-CM) - Left foot drop   THERAPY DIAG:  Foot drop, left  Muscle weakness (generalized)  Other abnormalities of gait and mobility  Radiculopathy of lumbosacral region  Rationale for Evaluation and Treatment Rehabilitation  SUBJECTIVE:  SUBJECTIVE STATEMENT: Pt reports HEP is going well and he even feels like his foot is slapping less. Pt reports he has purchased foot up brace online.  Pt accompanied by: self  PERTINENT HISTORY: diabetic mellitus, hypertension, hyperlipidemia, and history of prostate cancer; s/p L THA 2018  PAIN:  Are you having pain? No  PLOF: Independent  PATIENT GOALS "I don't want my foot to slap anymore, it's an annoyance"   OBJECTIVE:  TODAY'S TREATMENT:   GAIT TRAINING:  Treadmill Forwards 1.5 mph with progression to 5% grade with no bracing, no L foot drop noted, x 5 min Backwards 0.9 mph with progression to 4% grade, x 5 min Laterally to the L and to the R at 0.7 mph, x 2.5 min each direction  Gait around therapy gym following treadmill training with no evidence of L foot drop even after fatiguing muscles during gait on treadmill.  THER EX: Added to HEP, see bolded below   PATIENT EDUCATION: Education details: continue HEP Person educated: Patient Education method: Explanation and Handouts Education comprehension: verbalized understanding   HOME EXERCISE PROGRAM: Access Code: [email or web address] URL: [email or web address] Date: 04/04/2022 Prepared by: Mickie Bail Plaster  Exercises - Heel Walking with Counter Support  - 1 x daily - 7 x weekly  - 3 sets - 10 reps - Toe Walking with Counter Support  - 1 x daily - 7 x weekly - 3 sets - 10 reps - Eccentric Heel Lowering on Step  - 1 x daily - 7 x weekly - 3 sets - 10 reps - Side Stepping with Resistance at Ankles  - 1 x daily - 7 x weekly - 1 sets - 10 reps - Forward Monster Walks  - 1 x daily - 7 x weekly - 1 sets - 10 reps   GOALS: Goals reviewed with patient? Yes  SHORT TERM GOALS=LONG TERM GOALS DUE TO LENGTH OF POC  LONG TERM GOALS: Target date: 05/11/2022  Pt will be independent with final HEP for improved strength, balance, transfers and gait. Baseline:  Goal status: INITIAL  2.  Pt will improve gait velocity to at least 3.5 ft/sec for improved gait efficiency and performance at mod I to Independent level  Baseline: 2.9 ft/sec no AD Goal status: INITIAL  3.  Pt will improve FGA to 27/30 for decreased fall risk  Baseline: 21/30 Goal status: INITIAL  4.  Pt will trial various AFO's and LE bracing options to decrease fall risk Baseline:  Goal status: INITIAL   ASSESSMENT:  CLINICAL IMPRESSION: Emphasis of skilled PT session on adding to HEP for LE strengthening and working on assessing gait on treadmill without bracing and then overground following fatigue on treadmill to assess gait. Pt exhibits no evidence of L foot drop this session during gait on treadmill or overground. Pt continues to benefit from skilled therapy services to address other gait impairments including scissoring of gait and narrow BOS. Continue POC.   OBJECTIVE IMPAIRMENTS Abnormal gait, decreased balance, decreased mobility, difficulty walking, decreased strength, impaired perceived functional ability, and impaired sensation.   ACTIVITY LIMITATIONS stairs  PARTICIPATION LIMITATIONS: community activity and golfing  PERSONAL FACTORS 1-2 comorbidities:    diabetic mellitus, hypertension, hyperlipidemia, and history of prostate cancer who is s/p L THA in 2018are also affecting patient's functional  outcome.   REHAB POTENTIAL: Excellent  CLINICAL DECISION MAKING: Stable/uncomplicated  EVALUATION COMPLEXITY: Low  PLAN: PT FREQUENCY: 2x/week  PT DURATION: 6 weeks (9 visits)  PLANNED INTERVENTIONS: Therapeutic exercises, Therapeutic activity, Neuromuscular re-education, Balance  training, Gait training, Patient/Family education, Self Care, Joint mobilization, Joint manipulation, Stair training, Orthotic/Fit training, DME instructions, Aquatic Therapy, Dry Needling, Electrical stimulation, Spinal manipulation, Spinal mobilization, Cryotherapy, Moist heat, Taping, Biofeedback, Manual therapy, and Re-evaluation  PLAN FOR NEXT SESSION: did pt receive foot-up brace?, reassess goals and plan for d/c?, hip abd strengthening, L ankle strengthening with tband   Excell Seltzer, PT, DPT, CSRS 04/13/2022, 10:16 AM

## 2022-04-15 ENCOUNTER — Ambulatory Visit: Payer: Medicare Other | Admitting: Neurology

## 2022-04-18 ENCOUNTER — Ambulatory Visit: Payer: Medicare Other | Admitting: Physical Therapy

## 2022-04-18 DIAGNOSIS — R2689 Other abnormalities of gait and mobility: Secondary | ICD-10-CM

## 2022-04-18 DIAGNOSIS — M6281 Muscle weakness (generalized): Secondary | ICD-10-CM

## 2022-04-18 DIAGNOSIS — M5417 Radiculopathy, lumbosacral region: Secondary | ICD-10-CM | POA: Diagnosis not present

## 2022-04-18 DIAGNOSIS — M21372 Foot drop, left foot: Secondary | ICD-10-CM

## 2022-04-18 NOTE — Therapy (Signed)
OUTPATIENT PHYSICAL THERAPY NEURO TREATMENT   Patient Name: Ricardo Reese MRN: [id number] DOB:1950/11/22, 71 y.o., male Today's Date: 04/18/2022   PCP: Venia Carbon, MD REFERRING PROVIDER: Alda Berthold, DO    PT End of Session - 04/18/22 1014     Visit Number 5    Number of Visits 9   with eval   Date for PT Re-Evaluation 05/11/22    Authorization Type Medicare    PT Start Time 1014    PT Stop Time 1056    PT Time Calculation (min) 42 min    Equipment Utilized During Treatment Other (comment)   L foot up brace   Activity Tolerance Patient tolerated treatment well    Behavior During Therapy WFL for tasks assessed/performed                 Past Medical History:  Diagnosis Date   Diabetes mellitus without complication ([place])    History of kidney stones    has had twice   Hyperlipidemia    Hypertension    Impaired fasting glucose    Kidney stone 1983   Melanoma of back [facility]) 2013   Dr Evorn Gong   Pneumonia    walking back in 2002   Prostate cancer ([place]) 2019   Skin cancer 06/14/2011   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   Past Surgical History:  Procedure Laterality Date   KNEE SURGERY  1969   cartilage removal right knee    melanoma back  09/2010   PROSTATE BIOPSY     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   Procedure: LEFT TOTAL HIP ARTHROPLASTY ANTERIOR APPROACH;  Surgeon: Mcarthur Rossetti, MD;  Location: [place];  Service: Orthopedics;  Laterality: Left;   Patient Active Problem List   Diagnosis Date Noted   Left foot drop 12/31/2021   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   Right knee pain 10/25/2019   History of prostate cancer 01/02/2018   Advance directive discussed with patient 09/26/2016   Unilateral primary osteoarthritis, left hip 09/20/2016   Status post left hip replacement 09/20/2016   Routine general medical examination at a health care facility 08/17/2011   History of melanoma    Hyperlipemia  05/20/2008   Benign essential hypertension 05/20/2008   Type 2 diabetes mellitus with other circulatory complications ([place]) 31/54/0086    ONSET DATE: 03/22/2022   REFERRING DIAG: P61.950 (ICD-10-CM) - Left foot drop   THERAPY DIAG:  Foot drop, left  Muscle weakness (generalized)  Other abnormalities of gait and mobility  Radiculopathy of lumbosacral region  Rationale for Evaluation and Treatment Rehabilitation  SUBJECTIVE:  SUBJECTIVE STATEMENT: Pt reports he has not noticed any L foot drop or foot slapping recently. Pt brings in his foot up brace he purchased.  Pt accompanied by: self  PERTINENT HISTORY: diabetic mellitus, hypertension, hyperlipidemia, and history of prostate cancer; s/p L THA 2018  PAIN:  Are you having pain? No  PLOF: Independent  PATIENT GOALS "I don't want my foot to slap anymore, it's an annoyance"   OBJECTIVE:  TODAY'S TREATMENT:   GAIT TRAINING:  Gait pattern: WFL Distance walked: 115 ft Assistive device utilized: None Level of assistance: Complete Independence Comments: no brace  Gait pattern: WFL Distance walked: 230 ft Assistive device utilized: None Level of assistance: Complete Independence Comments: with foot up brace; increased gait speed  Setup foot up brace for patient in his L shoe  THER EX: Added to HEP, see bolded below  THER ACT: At countertop Sidesteps L/R on foam beam with fingertip support Sidesteps L/R on foam beam with alt L/R gumdrop taps, one UE support with progression to fingertip support  PATIENT EDUCATION: Education details: continue HEP, added to HEP, use of foot-up brace Person educated: Patient Education method: Explanation and Handouts Education comprehension: verbalized understanding   HOME EXERCISE  PROGRAM: Access Code: [email or web address] URL: [email or web address] Date: 04/04/2022 Prepared by: Mickie Bail Plaster  Exercises - Heel Walking with Counter Support  - 1 x daily - 7 x weekly - 3 sets - 10 reps - Toe Walking with Counter Support  - 1 x daily - 7 x weekly - 3 sets - 10 reps - Eccentric Heel Lowering on Step  - 1 x daily - 7 x weekly - 3 sets - 10 reps - Side Stepping with Resistance at Ankles  - 1 x daily - 7 x weekly - 1 sets - 10 reps - Forward Monster Walks  - 1 x daily - 7 x weekly - 1 sets - 10 reps - Seated Ankle Dorsiflexion with Anchored Resistance  - 1 x daily - 7 x weekly - 3 sets - 10 reps (GTB) - Seated Eccentric Ankle Plantar Flexion with Resistance - Straight Leg  - 1 x daily - 7 x weekly - 3 sets - 10 reps (RTB)   GOALS: Goals reviewed with patient? Yes  SHORT TERM GOALS=LONG TERM GOALS DUE TO LENGTH OF POC  LONG TERM GOALS: Target date: 05/11/2022  Pt will be independent with final HEP for improved strength, balance, transfers and gait. Baseline:  Goal status: INITIAL  2.  Pt will improve gait velocity to at least 3.5 ft/sec for improved gait efficiency and performance at mod I to Independent level  Baseline: 2.9 ft/sec no AD Goal status: INITIAL  3.  Pt will improve FGA to 27/30 for decreased fall risk  Baseline: 21/30 Goal status: INITIAL  4.  Pt will trial various AFO's and LE bracing options to decrease fall risk Baseline:  Goal status: INITIAL   ASSESSMENT:  CLINICAL IMPRESSION: Emphasis of skilled PT session on assessing gait with and without foot-up brace, setting up foot-up brace for patient, adding ankle strengthening exercises to HEP, and working on dynamic strengthening on compliant surface. Pt exhibits significantly improved ability to DF L foot during gait which is improved even more with use of foot-up brace. Pt also exhibits increased gait speed with use of brace. Pt to trial use of foot-up brace while golfing today. Discussed  POC and pt agreeable to d/c after next session due to improvements in gait mechanics and L ankle strength. Continue POC.  OBJECTIVE IMPAIRMENTS Abnormal gait, decreased balance, decreased mobility, difficulty walking, decreased strength, impaired perceived functional ability, and impaired sensation.   ACTIVITY LIMITATIONS stairs  PARTICIPATION LIMITATIONS: community activity and golfing  PERSONAL FACTORS 1-2 comorbidities:    diabetic mellitus, hypertension, hyperlipidemia, and history of prostate cancer who is s/p L THA in 2018are also affecting patient's functional outcome.   REHAB POTENTIAL: Excellent  CLINICAL DECISION MAKING: Stable/uncomplicated  EVALUATION COMPLEXITY: Low  PLAN: PT FREQUENCY: 2x/week  PT DURATION: 6 weeks (9 visits)  PLANNED INTERVENTIONS: Therapeutic exercises, Therapeutic activity, Neuromuscular re-education, Balance training, Gait training, Patient/Family education, Self Care, Joint mobilization, Joint manipulation, Stair training, Orthotic/Fit training, DME instructions, Aquatic Therapy, Dry Needling, Electrical stimulation, Spinal manipulation, Spinal mobilization, Cryotherapy, Moist heat, Taping, Biofeedback, Manual therapy, and Re-evaluation  PLAN FOR NEXT SESSION: reassess LTG and d/c from PT   Excell Seltzer, PT, DPT, CSRS 04/18/2022, 10:58 AM

## 2022-04-20 ENCOUNTER — Encounter: Payer: Medicare Other | Admitting: Internal Medicine

## 2022-04-20 ENCOUNTER — Ambulatory Visit: Payer: Medicare Other | Admitting: Physical Therapy

## 2022-04-22 ENCOUNTER — Ambulatory Visit: Payer: Medicare Other | Admitting: Physical Therapy

## 2022-04-25 ENCOUNTER — Ambulatory Visit: Payer: Medicare Other

## 2022-04-27 ENCOUNTER — Ambulatory Visit: Payer: Medicare Other | Admitting: Physical Therapy

## 2022-04-27 DIAGNOSIS — M6281 Muscle weakness (generalized): Secondary | ICD-10-CM | POA: Diagnosis not present

## 2022-04-27 DIAGNOSIS — R2689 Other abnormalities of gait and mobility: Secondary | ICD-10-CM | POA: Diagnosis not present

## 2022-04-27 DIAGNOSIS — M5417 Radiculopathy, lumbosacral region: Secondary | ICD-10-CM | POA: Diagnosis not present

## 2022-04-27 DIAGNOSIS — M21372 Foot drop, left foot: Secondary | ICD-10-CM | POA: Diagnosis not present

## 2022-04-27 NOTE — Therapy (Signed)
OUTPATIENT PHYSICAL THERAPY NEURO TREATMENT-DISCHARGE NOTE   Patient Name: Ricardo Reese MRN: [id number] DOB:02/29/1951, 71 y.o., male Today's Date: 04/27/2022   PCP: Venia Carbon, MD REFERRING PROVIDER: Alda Berthold, DO   PHYSICAL THERAPY DISCHARGE SUMMARY  Visits from Start of Care: 6  Current functional level related to goals / functional outcomes: Independent   Remaining deficits: Mild balance impairments, slight decrease in L DF strength   Education / Equipment: HEP handout, L foot-up brace   Patient agrees to discharge. Patient goals were met. Patient is being discharged due to meeting the stated rehab goals.    PT End of Session - 04/27/22 0932     Visit Number 6    Number of Visits 9   with eval   Date for PT Re-Evaluation 05/11/22    Authorization Type Medicare    PT Start Time 0930    PT Stop Time 1275   d/c from PT   PT Time Calculation (min) 18 min    Equipment Utilized During Treatment Other (comment)   L foot up brace   Activity Tolerance Patient tolerated treatment well    Behavior During Therapy [facility] for tasks assessed/performed                  Past Medical History:  Diagnosis Date   Diabetes mellitus without complication ([place])    History of kidney stones    has had twice   Hyperlipidemia    Hypertension    Impaired fasting glucose    Kidney stone 1983   Melanoma of back [facility]) 2013   Dr Evorn Gong   Pneumonia    walking back in 2002   Prostate cancer ([place]) 2019   Skin cancer 06/14/2011   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   Past Surgical History:  Procedure Laterality Date   KNEE SURGERY  1969   cartilage removal right knee    melanoma back  09/2010   PROSTATE BIOPSY     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   Procedure: LEFT TOTAL HIP ARTHROPLASTY ANTERIOR APPROACH;  Surgeon: Mcarthur Rossetti, MD;  Location: [place];  Service: Orthopedics;  Laterality: Left;   Patient Active  Problem List   Diagnosis Date Noted   Left foot drop 12/31/2021   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   Right knee pain 10/25/2019   History of prostate cancer 01/02/2018   Advance directive discussed with patient 09/26/2016   Unilateral primary osteoarthritis, left hip 09/20/2016   Status post left hip replacement 09/20/2016   Routine general medical examination at a health care facility 08/17/2011   History of melanoma    Hyperlipemia 05/20/2008   Benign essential hypertension 05/20/2008   Type 2 diabetes mellitus with other circulatory complications ([place]) 17/00/1749    ONSET DATE: 03/22/2022   REFERRING DIAG: S49.675 (ICD-10-CM) - Left foot drop   THERAPY DIAG:  Foot drop, left  Muscle weakness (generalized)  Other abnormalities of gait and mobility  Radiculopathy of lumbosacral region  Rationale for Evaluation and Treatment Rehabilitation  SUBJECTIVE:  SUBJECTIVE STATEMENT: Pt reports his walking has been "excellent", "no issues". Pt reports he takes his foot-up brace with him but he hasn't need to use it. Reports he has has been doing his HEP. Pt reports he was able to golf 3x/week with no issues.  Pt accompanied by: self  PERTINENT HISTORY: diabetic mellitus, hypertension, hyperlipidemia, and history of prostate cancer; s/p L THA 2018  PAIN:  Are you having pain? No  PLOF: Independent  PATIENT GOALS "I don't want my foot to slap anymore, it's an annoyance"   OBJECTIVE:  TODAY'S TREATMENT:   THER ACT:  Reassessed OM for LTG assessment:  [facility] PT Assessment - 04/27/22 0939       Ambulation/Gait   Gait velocity 32.8 ft over 7.44 sec = 4.41 ft/sec      Functional Gait  Assessment   Gait assessed  Yes    Gait Level Surface Walks 20 ft in less than 5.5 sec, no assistive  devices, good speed, no evidence for imbalance, normal gait pattern, deviates no more than 6 in outside of the 12 in walkway width.    Change in Gait Speed Able to smoothly change walking speed without loss of balance or gait deviation. Deviate no more than 6 in outside of the 12 in walkway width.    Gait with Horizontal Head Turns Performs head turns smoothly with no change in gait. Deviates no more than 6 in outside 12 in walkway width    Gait with Vertical Head Turns Performs head turns with no change in gait. Deviates no more than 6 in outside 12 in walkway width.    Gait and Pivot Turn Pivot turns safely within 3 sec and stops quickly with no loss of balance.    Step Over Obstacle Is able to step over 2 stacked shoe boxes taped together (9 in total height) without changing gait speed. No evidence of imbalance.    Gait with Narrow Base of Support Ambulates 7-9 steps.    Gait with Eyes Closed Walks 20 ft, no assistive devices, good speed, no evidence of imbalance, normal gait pattern, deviates no more than 6 in outside 12 in walkway width. Ambulates 20 ft in less than 7 sec.    Ambulating Backwards Walks 20 ft, uses assistive device, slower speed, mild gait deviations, deviates 6-10 in outside 12 in walkway width.    Steps Alternating feet, no rail.    Total Score 28    FGA comment: low fall risk            L DF strength: 4/5 (improved from 2- on eval)  PATIENT EDUCATION: Education details: continue HEP and use of L foot-up brace if needed Person educated: Patient Education method: Explanation Education comprehension: verbalized understanding   HOME EXERCISE PROGRAM: Access Code: [email or web address] URL: [email or web address] Date: 04/04/2022 Prepared by: Mickie Bail Plaster  Exercises - Heel Walking with Counter Support  - 1 x daily - 7 x weekly - 3 sets - 10 reps - Toe Walking with Counter Support  - 1 x daily - 7 x weekly - 3 sets - 10 reps - Eccentric Heel Lowering on Step  -  1 x daily - 7 x weekly - 3 sets - 10 reps - Side Stepping with Resistance at Ankles  - 1 x daily - 7 x weekly - 1 sets - 10 reps - Forward Monster Walks  - 1 x daily - 7 x weekly - 1 sets - 10 reps - Seated Ankle Dorsiflexion with Anchored  Resistance  - 1 x daily - 7 x weekly - 3 sets - 10 reps (GTB) - Seated Eccentric Ankle Plantar Flexion with Resistance - Straight Leg  - 1 x daily - 7 x weekly - 3 sets - 10 reps (RTB)   GOALS: Goals reviewed with patient? Yes  SHORT TERM GOALS=LONG TERM GOALS DUE TO LENGTH OF POC  LONG TERM GOALS: Target date: 05/11/2022  Pt will be independent with final HEP for improved strength, balance, transfers and gait. Baseline:  Goal status: MET  2.  Pt will improve gait velocity to at least 3.5 ft/sec for improved gait efficiency and performance at mod I to Independent level  Baseline: 2.9 ft/sec no AD, 4.41 ft/sec Goal status: MET  3.  Pt will improve FGA to 27/30 for decreased fall risk  Baseline: 21/30, 28/30 Goal status: MET  4.  Pt will trial various AFO's and LE bracing options to decrease fall risk Baseline:  Goal status: MET   ASSESSMENT:  CLINICAL IMPRESSION: Emphasis of skilled PT session on reassessing LTG in preparation for d/c from PT services this date. Pt has met 4/4 LTG due to being independent with his HEP, improving his gait speed from 2.9 ft/sec to 4.41 ft/sec this date, improving his FGA score from 21/30 initially to 28/30 this date, and trialing various AFOs with best improvement noted with use of L foot-up brace. Pt is agreeable to d/c from PT services at this time. Continue POC.   OBJECTIVE IMPAIRMENTS Abnormal gait, decreased balance, decreased mobility, difficulty walking, decreased strength, impaired perceived functional ability, and impaired sensation.   ACTIVITY LIMITATIONS stairs  PARTICIPATION LIMITATIONS: community activity and golfing  PERSONAL FACTORS 1-2 comorbidities:    diabetic mellitus, hypertension,  hyperlipidemia, and history of prostate cancer who is s/p L THA in 2018are also affecting patient's functional outcome.   REHAB POTENTIAL: Excellent  CLINICAL DECISION MAKING: Stable/uncomplicated  EVALUATION COMPLEXITY: Low     Excell Seltzer, PT, DPT, CSRS 04/27/2022, 9:49 AM

## 2022-05-23 ENCOUNTER — Encounter: Payer: Self-pay | Admitting: Neurology

## 2022-05-23 ENCOUNTER — Ambulatory Visit (INDEPENDENT_AMBULATORY_CARE_PROVIDER_SITE_OTHER): Payer: Medicare Other | Admitting: Neurology

## 2022-05-23 VITALS — BP 144/65 | HR 58 | Ht 70.0 in | Wt 207.0 lb

## 2022-05-23 DIAGNOSIS — M21372 Foot drop, left foot: Secondary | ICD-10-CM | POA: Diagnosis not present

## 2022-05-23 DIAGNOSIS — M5417 Radiculopathy, lumbosacral region: Secondary | ICD-10-CM

## 2022-05-23 NOTE — Progress Notes (Signed)
Follow-up Visit   Date: 05/23/2022    Ricardo Reese MRN: [id number] DOB: 05-May-1951    Ricardo Reese is a 71 y.o. right-handed Caucasian male with diabetes mellitus, hypertension, hyperlipidemia, and history of prostate cancer returning to the clinic for left foot drop.  The patient was accompanied to the clinic by self.  IMPRESSION/PLAN: Left foot drop due to L5 radiculopathy, resolved.  NCS/EMG shows multilevel lumbosacral radiculopathy affecting the left > right legs, worse at L5-S1, and manifesting with painless left foot drop.  Fortunately, he has resolution of symptoms with PT.  Continue home exercises.  If symptoms return or he develops new radicular pain, MRI lumbar spine can be ordered.  Regarding confusional spells, recommend monitoring and encouraged to eat three meals a day. Pt reassured no signs of dementia based on his clinical symptomology.  Return to clinic as needed  --------------------------------------------- History of present illness: Starting around May 2023, he began noticing that his left foot would slap the ground when walking.  He has not had any falls, but notices that the foot can catch at times. He denies numbness/tingling or back pain.  No radicular pain.  No weakness in the arms or right leg.  He admits to crossing his leg over the left knee.  No history of back surgery.   He worked for [employer] for 45 year in [place] and lived in [place].   UPDATE 03/29/2022:  He is here for EDX.  He reports ongoing weakness of the left foot, slightly better.  Physical therapy has been delayed due to rescheduling issues, and will be starting this tomorrow.  He denies back pain or leg pain.  No weakness in the right leg.   UPDATE 05/23/2022:  He is here for follow-up visit.  He completed PT and appreciated marked improvement in his left foot weakness.  He is no longer dragging the foot and feels like it is back to normal.  No back pain or left foot paresthesias.     Wife reports that he has had a few spells over the past several years where he was visiting his daughters and unable to locate the bathroom in their home.  Another time, he was exiting the home from the front door instead of the garage, where their vehicle is.  She is worried that his confusion may be stemming from the fact that he does not eat 3 meals a day and is diabetic.  He tends to skip breakfast and lunch.  There are no associated memory problems, difficulty with navigating when driving, or difficulty with managing medications/finances.   Medications:  Current Outpatient Medications on File Prior to Visit  Medication Sig Dispense Refill   amLODipine (NORVASC) 5 MG tablet TAKE 1 TABLET BY MOUTH EVERY DAY 90 tablet 3   ammonium lactate (AMLACTIN) 12 % lotion Apply 1 application topically as needed for dry skin. (Patient not taking: Reported on 03/21/2022) 400 g 1   B Complex-C (SUPER B COMPLEX PO) Take 1 tablet by mouth daily.      cholecalciferol (VITAMIN D3) 25 MCG (1000 UNIT) tablet Take 1,000 Units by mouth daily.     fish oil-omega-3 fatty acids 1000 MG capsule Take 1 g by mouth daily.     metFORMIN (GLUCOPHAGE) 500 MG tablet TAKE 1 TABLET BY MOUTH 2 TIMES DAILY WITH A MEAL. 180 tablet 3   methocarbamol (ROBAXIN) 750 MG tablet Take 1 tablet (750 mg total) by mouth every 8 (eight) hours as needed for  muscle spasms. (Patient not taking: Reported on 03/21/2022) 60 tablet 1   Misc. Devices MISC Left AFO   Dx: Left foot drop  M21. 372 1 Units 0   pravastatin (PRAVACHOL) 40 MG tablet TAKE 1 TABLET BY MOUTH EVERY DAY 90 tablet 3   sildenafil (REVATIO) 20 MG tablet Take 3-5 tablets (60-100 mg total) by mouth daily as needed. 50 tablet 11   valsartan-hydrochlorothiazide (DIOVAN-HCT) 320-25 MG tablet TAKE 1 TABLET BY MOUTH EVERY DAY 90 tablet 3   No current facility-administered medications on file prior to visit.    Allergies:  Allergies  Allergen Reactions   No Known Allergies      Vital Signs:  There were no vitals taken for this visit.  Neurological Exam: MENTAL STATUS including orientation to time, place, person, recent and remote memory, attention span and concentration, language, and fund of knowledge is normal.  Speech is not dysarthric.  CRANIAL NERVES: Pupils equal round and reactive to light.  Normal conjugate, extra-ocular eye movements in all directions of gaze.  No ptosis. Normal facial sensation.  Face is symmetric.   MOTOR:  Motor strength is 5/5 in all extremities, including left dorsiflexion and toe extension.  No atrophy, fasciculations or abnormal movements.  No pronator drift.  Tone is normal.    MSRs:  Reflexes are 2+/4 throughout.  SENSORY:  Intact to vibration throughout.  COORDINATION/GAIT:  Normal finger-to- nose-finger.  Intact rapid alternating movements bilaterally.  Gait narrow based and stable.    Data: NCS/EMG of the legs 03/29/2022: Chronic left L5-S1 radiculopathy, moderate. Chronic right L5-S1 and left L4 radiculopathy, mild. There is no evidence of a sensorimotor polyneuropathy with the affecting the left lower extremity.   Thank you for allowing me to participate in patient's care.  If I can answer any additional questions, I would be pleased to do so.    Sincerely,    Faithanne Verret K. Posey Pronto, DO

## 2022-05-23 NOTE — Patient Instructions (Signed)
Happy Holidays!  It was great to see you today.  I am so pleased to see full recovery of your foot drop.  Continue your home strengthening exercises.  Return to clinic as needed

## 2022-05-25 ENCOUNTER — Ambulatory Visit (INDEPENDENT_AMBULATORY_CARE_PROVIDER_SITE_OTHER): Payer: Medicare Other

## 2022-05-25 DIAGNOSIS — Z23 Encounter for immunization: Secondary | ICD-10-CM

## 2022-05-26 ENCOUNTER — Encounter: Payer: Medicare Other | Admitting: Internal Medicine

## 2022-06-02 ENCOUNTER — Ambulatory Visit (INDEPENDENT_AMBULATORY_CARE_PROVIDER_SITE_OTHER): Payer: Medicare Other

## 2022-06-02 ENCOUNTER — Ambulatory Visit (INDEPENDENT_AMBULATORY_CARE_PROVIDER_SITE_OTHER): Payer: Medicare Other | Admitting: Orthopedic Surgery

## 2022-06-02 ENCOUNTER — Encounter: Payer: Self-pay | Admitting: Orthopedic Surgery

## 2022-06-02 VITALS — BP 155/87 | HR 69 | Ht 70.0 in | Wt 207.0 lb

## 2022-06-02 DIAGNOSIS — M7062 Trochanteric bursitis, left hip: Secondary | ICD-10-CM

## 2022-06-02 NOTE — Progress Notes (Signed)
Orthopedic Spine Surgery Office Note  Assessment: Patient is a 71 y.o. male with chronic left lateral hip pain with an acute flare.  Has tenderness palpation of the greater trochanter.  Has a history of left foot drop and an EMG/NCS showing L5-S1 radiculopathy   Plan: -Explained that initially conservative treatment is tried as a significant number of patients may experience relief with these treatment modalities. Discussed that the conservative treatments include:  -activity modification  -physical therapy  -over the counter pain medications  -medrol dosepak  -lumbar steroid injections -Patient has tried PT, home exercises, trochanteric injection, tylenol, cane use -Recommend repeat trochanteric injection. This was done today in the office (see procedure note below)  -Since he has done over 6 weeks of home exercises (IT exercises prescribed by Benita Stabile, PA) and PT (prescribed by Dr. Posey Pronto) if this injection does not work in addition to home exercises, will recommend MRI of the lumbar spine to evaluate for radiculopathy -He will call and I can order that MRI and gabapentin to give him additional pain relief -Patient should return to office in 6 weeks, x-rays at next visit: none   Patient expressed understanding of the plan and all questions were answered to the patient's satisfaction.    Left trochanter injection note: Discussed risk, benefits, alternatives of left trochanteric bursa injection.  After discussion, patient elected to proceed.  The lateral aspect of the hip was sterilized with alcohol.  Ethyl chloride was used to anesthetize the injection spot.  A 20-gauge needle with 1 cc of Depo-Medrol and 2 cc of 0.5% Marcaine was inserted into the left trochanteric bursa under standard sterile technique.  The medication was injected.  The needle was withdrawn.  A Band-Aid was applied.  Patient tolerated the procedure well. Patient felt hip pain was better when leaving the office.   ___________________________________________________________________________   History:  Patient is a 71 y.o. male with history of left THA by Dr. Ninfa Linden who presents today for lumbar spine.  Patient has history of left-sided hip pain.  He has previously seen Benita Stabile, PA in the office for this problem.  He has received trochanteric injection in the past.  He comes in today because he had an acute flare of pain in the lateral aspect of his hip.  He feels that over the trochanter and did feel it over the lateral aspect of the thigh.  It did not radiate past the knee.  This happened yesterday after he bent over to take additional dishwasher.  There was no other trauma or injury.  Pain is now more localized to just over the trochanteric area.  He has no symptoms on the right side.  He has recently seen neurology for a left foot drop.  That got better with some physical therapy.  He had an EMG/NCS while he is being evaluated with them that showed L5/S1 radiculopathy.  Not having any back pain.  Has started using a cane to offload the left leg.  It is helping somewhat.  Artis Delay had previously recommended some home exercises after the trochanteric injection in May.  He got better with those exercises.  However, he subsequently stopped doing his exercises.  Weakness: Denies Symptoms of imbalance: Denies Paresthesias and numbness: Denies Bowel or bladder incontinence: Denies Saddle anesthesia: Denies  Treatments tried: trochanteric injection, IT stretching exercises, PT, tylenol, cane use  Review of systems: Denies fevers and chills, night sweats, unexplained weight loss, history of cancer.  Has had pain that wakes him at night  Past medical history: Prostate cancer HTN HLD DM2 (A1C 6.1 on 12/31/2021)  Allergies: NKDA  Past surgical history:  Left THA Right knee arthroscopy Melanoma excision  Social history: Denies use of nicotine product (smoking, vaping, patches, smokeless) Alcohol use:  yes, 3 drinks/week Denies recreational drug use   Physical Exam:  General: no acute distress, appears stated age Neurologic: alert, answering questions appropriately, following commands Respiratory: unlabored breathing on room air, symmetric chest rise Psychiatric: appropriate affect, normal cadence to speech   MSK (spine):  -Strength exam      Left  Right EHL    5/5  5/5 TA    5/5  5/5 GSC    5/5  5/5 Knee extension  5/5  5/5 Hip flexion   5/5  5/5  -Sensory exam    Sensation intact to light touch in L3-S1 nerve distributions of bilateral lower extremities  -Achilles DTR: 1/4 on the left, 1/4 on the right -Patellar tendon DTR: 0/4 on the left, 0/4 on the right  -Straight leg raise: negative -Contralateral straight leg raise: negative -Clonus: no beats bilaterally  -Left hip exam: no pain through range of motion, negative stinchfield, TTP over the greater trochanter, no other tenderness to palpation -Right hip exam: no pain through range of motion, negative stinchfield  Imaging: XR of the lumbar spine from 06/01/2022 was independently reviewed and interpreted, showing 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 Left hip replacement with no lucency around the cup or stem seen. Anterior osteophyte formation at L3/4 and L5/S1. No fracture or dislocation. No evidence of instability on flexion/extension.   EMG/NCS from 03/29/2022 showed left L5/S1 radiculopathy, chronic right L5/S1 and left L4/5 radiculopathy  Patient name: Ricardo Reese Patient MRN: [id number] Date of visit: 06/02/22

## 2022-06-08 ENCOUNTER — Telehealth: Payer: Self-pay | Admitting: Orthopedic Surgery

## 2022-06-08 NOTE — Telephone Encounter (Signed)
Patient states he having issues shot did not  work . Please advise .Marland [phone]

## 2022-06-09 ENCOUNTER — Telehealth: Payer: Self-pay | Admitting: Orthopedic Surgery

## 2022-06-09 NOTE — Telephone Encounter (Signed)
See other message

## 2022-06-09 NOTE — Telephone Encounter (Signed)
Patient called in stating his left hip is still in pain wife called in yesterday and no response has been made yet.

## 2022-06-09 NOTE — Telephone Encounter (Signed)
I spoke with Dr. Laurance Flatten, advised that he has to give the injection 2 full weeks to take effect, at that point if not any better we need to get him into PT or home exercise program, and if he is not any better by his appointment that we can then order an MRI to eval his L-spine.  I called and advised the patient of this and he states that this is what he needed to know

## 2022-06-15 ENCOUNTER — Ambulatory Visit (AMBULATORY_SURGERY_CENTER): Payer: Medicare Other

## 2022-06-15 VITALS — Ht 70.0 in | Wt 208.0 lb

## 2022-06-15 DIAGNOSIS — Z1211 Encounter for screening for malignant neoplasm of colon: Secondary | ICD-10-CM

## 2022-06-15 NOTE — Progress Notes (Signed)

## 2022-06-30 ENCOUNTER — Ambulatory Visit (AMBULATORY_SURGERY_CENTER): Payer: Medicare Other | Admitting: Internal Medicine

## 2022-06-30 ENCOUNTER — Encounter: Payer: Self-pay | Admitting: Internal Medicine

## 2022-06-30 VITALS — BP 137/73 | HR 53 | Temp 97.7°F | Resp 13 | Ht 70.0 in | Wt 207.2 lb

## 2022-06-30 DIAGNOSIS — E119 Type 2 diabetes mellitus without complications: Secondary | ICD-10-CM | POA: Diagnosis not present

## 2022-06-30 DIAGNOSIS — Z1211 Encounter for screening for malignant neoplasm of colon: Secondary | ICD-10-CM | POA: Diagnosis not present

## 2022-06-30 DIAGNOSIS — I10 Essential (primary) hypertension: Secondary | ICD-10-CM | POA: Diagnosis not present

## 2022-06-30 MED ORDER — SODIUM CHLORIDE 0.9 % IV SOLN: 500.0000 mL | Freq: Once | INTRAVENOUS | Status: DC

## 2022-06-30 NOTE — Progress Notes (Signed)
VS completed by DT.  Pt's states no medical or surgical changes since previsit or office visit.  

## 2022-06-30 NOTE — Patient Instructions (Addendum)
Normal exam again - no polyps or cancer were seen.  No further routine colonoscopy or stool testing is recommended based upon your age and history (no polyps).  Should you have problems requiring colonoscopy (hope not!), then you could have one.  I appreciate the opportunity to care for you. Gatha Mayer, MD, [facility]  You may resume your previous diet and medication schedule.  Thank you for allowing [place] to care for you today!!!   YOU HAD AN ENDOSCOPIC PROCEDURE TODAY AT [facility]:   Refer to the procedure report that was given to you for any specific questions about what was found during the examination.  If the procedure report does not answer your questions, please call your gastroenterologist to clarify.  If you requested that your care partner not be given the details of your procedure findings, then the procedure report has been included in a sealed envelope for you to review at your convenience later.  YOU SHOULD EXPECT: Some feelings of bloating in the abdomen. Passage of more gas than usual.  Walking can help get rid of the air that was put into your GI tract during the procedure and reduce the bloating. If you had a lower endoscopy (such as a colonoscopy or flexible sigmoidoscopy) you may notice spotting of blood in your stool or on the toilet paper. If you underwent a bowel prep for your procedure, you may not have a normal bowel movement for a few days.  Please Note:  You might notice some irritation and congestion in your nose or some drainage.  This is from the oxygen used during your procedure.  There is no need for concern and it should clear up in a day or so.  SYMPTOMS TO REPORT IMMEDIATELY:  Following lower endoscopy (colonoscopy or flexible sigmoidoscopy):  Excessive amounts of blood in the stool  Significant tenderness or worsening of abdominal pains  Swelling of the abdomen that is new, acute  Fever of 100F or higher   For urgent or emergent issues,  a gastroenterologist can be reached at any hour by calling [phone]. Do not use MyChart messaging for urgent concerns.    DIET:  We do recommend a small meal at first, but then you may proceed to your regular diet.  Drink plenty of fluids but you should avoid alcoholic beverages for 24 hours.  ACTIVITY:  You should plan to take it easy for the rest of today and you should NOT DRIVE or use heavy machinery until tomorrow (because of the sedation medicines used during the test).    FOLLOW UP: Our staff will call the number listed on your records the next business day following your procedure.  We will call around 7:15- 8:00 am to check on you and address any questions or concerns that you may have regarding the information given to you following your procedure. If we do not reach you, we will leave a message.     If any biopsies were taken you will be contacted by phone or by letter within the next 1-3 weeks.  Please call us at [phone] if you have not heard about the biopsies in 3 weeks.    SIGNATURES/CONFIDENTIALITY: You and/or your care partner have signed paperwork which will be entered into your electronic medical record.  These signatures attest to the fact that that the information above on your After Visit Summary has been reviewed and is understood.  Full responsibility of the confidentiality of this discharge information lies  with you and/or your care-partner.  

## 2022-06-30 NOTE — Op Note (Signed)
[place] Patient Name: Ricardo Reese Procedure Date: 06/30/2022 10:43 AM MRN: [id number] Endoscopist: Gatha Mayer , MD, [id number] Age: 72 Referring MD:  Date of Birth: Jun 28, 1950 Gender: Male Account #: [id number] Procedure:                Colonoscopy Indications:              Screening for colorectal malignant neoplasm, Last                            colonoscopy: 2013 Medicines:                Propofol per Anesthesia, Monitored Anesthesia Care Procedure:                Pre-Anesthesia Assessment:                           - Prior to the procedure, a History and Physical                            was performed, and patient medications and                            allergies were reviewed. The patient's tolerance of                            previous anesthesia was also reviewed. The risks                            and benefits of the procedure and the sedation                            options and risks were discussed with the patient.                            All questions were answered, and informed consent                            was obtained. Prior Anticoagulants: The patient has                            taken no anticoagulant or antiplatelet agents. ASA                            Grade Assessment: III - A patient with severe                            systemic disease. After reviewing the risks and                            benefits, the patient was deemed in satisfactory                            condition to undergo the procedure.  After obtaining informed consent, the colonoscope                            was passed under direct vision. Throughout the                            procedure, the patient's blood pressure, pulse, and                            oxygen saturations were monitored continuously. The                            Olympus CF-HQ190L (Serial# [id number]) Colonoscope was                            introduced  through the anus and advanced to the the                            cecum, identified by appendiceal orifice and                            ileocecal valve. The quality of the bowel                            preparation was good. The colonoscopy was performed                            without difficulty. The patient tolerated the                            procedure well. The bowel preparation used was                            Miralax via split dose instruction. Scope In: 10:49:38 AM Scope Out: 49:44:96 AM Scope Withdrawal Time: 0 hours 12 minutes 27 seconds  Total Procedure Duration: 0 hours 16 minutes 33 seconds  Findings:                 The perianal and digital rectal examinations were                            normal.                           The colon (entire examined portion) appeared normal.                           No additional abnormalities were found on                            retroflexion. Complications:            No immediate complications. Estimated blood loss:                            None. Estimated Blood Loss:  Estimated blood loss: none. Impression:               - The entire examined colon is normal.                           - No specimens collected. Recommendation:           - Patient has a contact number available for                            emergencies. The signs and symptoms of potential                            delayed complications were discussed with the                            patient. Return to normal activities tomorrow.                            Written discharge instructions were provided to the                            patient.                           - Resume previous diet.                           - Continue present medications.                           - No repeat colonoscopy due to age and the absence                            of colonic polyps. Gatha Mayer, MD 06/30/2022 11:13:42 AM This report has been signed  electronically.

## 2022-06-30 NOTE — Progress Notes (Signed)
[facility] History and Physical   Primary Care Physician:  Venia Carbon, MD   Reason for Procedure:   CRCA screening  Plan:    colonoscopy     HPI: Ricardo Reese is a 72 y.o. male s/p negative screening colonoscopy 2013.   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 Authorizing Provider  amLODipine (NORVASC) 5 MG tablet TAKE 1 TABLET BY MOUTH EVERY DAY 01/25/22   Viviana Simpler I, MD  ammonium lactate (AMLACTIN) 12 % lotion Apply 1 application topically as needed for dry skin. Patient not taking: Reported on 03/21/2022 08/20/20   Felipa Furnace, DPM  B Complex-C (SUPER B COMPLEX PO) Take 1 tablet by mouth daily.     [provider]  Biotin (BIOTIN 5000) 5 MG CAPS Take by mouth.    [provider]  cholecalciferol (VITAMIN D3) 25 MCG (1000 UNIT) tablet Take 1,000 Units by mouth daily.    [provider]  fish oil-omega-3 fatty acids 1000 MG capsule Take 1 g by mouth daily.    [provider]  metFORMIN (GLUCOPHAGE) 500 MG tablet TAKE 1 TABLET BY MOUTH 2 TIMES DAILY WITH A MEAL. 12/08/21   Venia Carbon, MD  methocarbamol  (ROBAXIN) 750 MG tablet Take 1 tablet (750 mg total) by mouth every 8 (eight) hours as needed for muscle spasms. 09/22/21   Mcarthur Rossetti, MD  Misc. Devices MISC Left AFO   Dx: Left foot drop  M21. 372 04/01/22   Patel, Donika K, DO  pravastatin (PRAVACHOL) 40 MG tablet TAKE 1 TABLET BY MOUTH EVERY DAY 01/25/22   Viviana Simpler I, MD  sildenafil (REVATIO) 20 MG tablet Take 3-5 tablets (60-100 mg total) by mouth daily as needed. 12/31/21   Venia Carbon, MD  valsartan-hydrochlorothiazide (DIOVAN-HCT) 320-25 MG tablet TAKE 1 TABLET BY MOUTH EVERY DAY 08/06/21   Venia Carbon, MD    Current Outpatient Medications  Medication Sig Dispense Refill   amLODipine (NORVASC) 5 MG tablet TAKE 1 TABLET BY MOUTH EVERY DAY 90 tablet 3   ammonium lactate (AMLACTIN) 12 % lotion Apply 1 application topically as needed for dry skin. (Patient not taking: Reported on 03/21/2022) 400 g 1   B Complex-C (SUPER B COMPLEX PO) Take 1 tablet by mouth daily.      Biotin (BIOTIN 5000) 5 MG CAPS Take by mouth.     cholecalciferol (VITAMIN D3) 25 MCG (1000 UNIT) tablet Take 1,000 Units by mouth daily.     fish oil-omega-3  fatty acids 1000 MG capsule Take 1 g by mouth daily.     metFORMIN (GLUCOPHAGE) 500 MG tablet TAKE 1 TABLET BY MOUTH 2 TIMES DAILY WITH A MEAL. 180 tablet 3   methocarbamol (ROBAXIN) 750 MG tablet Take 1 tablet (750 mg total) by mouth every 8 (eight) hours as needed for muscle spasms. 60 tablet 1   Misc. Devices MISC Left AFO   Dx: Left foot drop  M21. 372 1 Units 0   pravastatin (PRAVACHOL) 40 MG tablet TAKE 1 TABLET BY MOUTH EVERY DAY 90 tablet 3   sildenafil (REVATIO) 20 MG tablet Take 3-5 tablets (60-100 mg total) by mouth daily as needed. 50 tablet 11   valsartan-hydrochlorothiazide (DIOVAN-HCT) 320-25 MG tablet TAKE 1 TABLET BY MOUTH EVERY DAY 90 tablet 3   Current Facility-Administered Medications  Medication Dose Route Frequency Provider Last Rate Last Admin   0.9 %  sodium  chloride infusion  500 mL Intravenous Once Gatha Mayer, MD        Allergies as of 06/30/2022 - Review Complete 06/30/2022  Allergen Reaction Noted   No known allergies  09/19/2016    Family History  Problem Relation Age of Onset   Heart attack Mother    Stroke Father    Lung cancer Father    Prostate cancer Brother    Heart disease Brother    Diabetes Neg Hx    Colon cancer Neg Hx    Stomach cancer Neg Hx    Esophageal cancer Neg Hx    Rectal cancer Neg Hx    Colon polyps Neg Hx     Social History   Socioeconomic History   Marital status: Married    Spouse name: [name]   Number of children: 2   Years of education: Not on file   Highest education level: Not on file  Occupational History   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  Tobacco Use   Smoking status: Never    Passive exposure: Past   Smokeless tobacco: Never  Vaping Use   Vaping Use: Never used  Substance and Sexual Activity   Alcohol use: Yes    Alcohol/week: 3.0 - 4.0 standard drinks of alcohol    Types: 3 - 4 Cans of beer per week    Comment: occasional beer   Drug use: No   Sexual activity: Yes  Other Topics Concern   Not on file  Social History Narrative   Has living will   Wife is health care POA--- daughters next   Would accept resuscitation   No tube feeds if cognitively unaware   Lives with wife   retired      Right Handed    Lives in a two story home    Social Determinants of Health   Financial Resource Strain: [place]  (02/03/2021)   Overall Financial Resource Strain (CARDIA)    Difficulty of Paying Living Expenses: Not hard at all  Food Insecurity: Not on file  Transportation Needs: No Transportation Needs (07/18/2018)   PRAPARE - Hydrologist (Medical): No    Lack of Transportation (Non-Medical): No  Physical Activity: Not on file  Stress: Not on file  Social Connections: Not on file  Intimate Partner Violence: Not on  file    Review of Systems:  All other review of systems negative except as mentioned in the HPI.  Physical Exam: Vital signs BP 119/71   Pulse 68  Temp 97.7 F (36.5 C) (Temporal)   Ht '5\' 10"'$  (1.778 m)   Wt 207 lb 3.2 oz (94 kg)   SpO2 100%   BMI 29.73 kg/m   General:   Alert,  Well-developed, well-nourished, pleasant and cooperative in NAD Lungs:  Clear throughout to auscultation.   Heart:  Regular rate and rhythm; no murmurs, clicks, rubs,  or gallops. Abdomen:  Soft, nontender and nondistended. Normal bowel sounds.   Neuro/Psych:  Alert and cooperative. Normal mood and affect. A and O x 3   '@Demeshia [place]'$  Simonne Maffucci, MD, [facility] [phone] (pager) 06/30/2022 10:33 AM@

## 2022-06-30 NOTE — Progress Notes (Signed)
Pt resting comfortably. VSS. Airway intact. SBAR complete to RN. All questions answered.   

## 2022-07-01 ENCOUNTER — Telehealth: Payer: Self-pay

## 2022-07-01 NOTE — Telephone Encounter (Signed)
  Follow up Call-     06/30/2022   10:30 AM  Call back number  Post procedure Call Back phone  # [phone]  Permission to leave phone message Yes     Patient questions:  Do you have a fever, pain , or abdominal swelling? No. Pain Score  0 *  Have you tolerated food without any problems? Yes.    Have you been able to return to your normal activities? Yes.    Do you have any questions about your discharge instructions: Diet   No. Medications  No. Follow up visit  No.  Do you have questions or concerns about your Care? No.  Actions: * If pain score is 4 or above: No action needed, pain <4.

## 2022-07-05 ENCOUNTER — Encounter: Payer: Self-pay | Admitting: Internal Medicine

## 2022-07-05 ENCOUNTER — Ambulatory Visit (INDEPENDENT_AMBULATORY_CARE_PROVIDER_SITE_OTHER): Payer: Medicare Other | Admitting: Internal Medicine

## 2022-07-05 VITALS — BP 138/78 | HR 60 | Temp 97.8°F | Ht 70.0 in | Wt 208.0 lb

## 2022-07-05 DIAGNOSIS — E1159 Type 2 diabetes mellitus with other circulatory complications: Secondary | ICD-10-CM | POA: Diagnosis not present

## 2022-07-05 DIAGNOSIS — I10 Essential (primary) hypertension: Secondary | ICD-10-CM

## 2022-07-05 LAB — POCT GLYCOSYLATED HEMOGLOBIN (HGB A1C): Hemoglobin A1C: 6.1 % — AB (ref 4.0–5.6)

## 2022-07-05 NOTE — Assessment & Plan Note (Signed)
Lab Results  Component Value Date   HGBA1C 6.1 (A) 07/05/2022   Still has good control on metformin 500 bid Discussed fitness and weight loss

## 2022-07-05 NOTE — Assessment & Plan Note (Signed)
BP Readings from Last 3 Encounters:  07/05/22 138/78  06/30/22 137/73  06/02/22 (!) 155/87   Good control on valsartan/HCTZ 320/25

## 2022-07-05 NOTE — Progress Notes (Signed)
Subjective:    Patient ID: Ricardo Reese, male    DOB: 1950-07-22, 72 y.o.   MRN: [id number]  HPI Here for follow up of diabetes  Doing well with sugars Checks weekly---last 99 (fairly typical) No foot numbness, burning or pain  No chest pain No SOB  Having some trouble with left hip Had cortisone injection---limited help for bursa (had THR) Is going back to Dr Ninfa Linden  Current Outpatient Medications on File Prior to Visit  Medication Sig Dispense Refill   amLODipine (NORVASC) 5 MG tablet TAKE 1 TABLET BY MOUTH EVERY DAY 90 tablet 3   ammonium lactate (AMLACTIN) 12 % lotion Apply 1 application topically as needed for dry skin. 400 g 1   B Complex-C (SUPER B COMPLEX PO) Take 1 tablet by mouth daily.      Biotin (BIOTIN 5000) 5 MG CAPS Take by mouth.     cholecalciferol (VITAMIN D3) 25 MCG (1000 UNIT) tablet Take 1,000 Units by mouth daily.     fish oil-omega-3 fatty acids 1000 MG capsule Take 1 g by mouth daily.     metFORMIN (GLUCOPHAGE) 500 MG tablet TAKE 1 TABLET BY MOUTH 2 TIMES DAILY WITH A MEAL. 180 tablet 3   Misc. Devices MISC Left AFO   Dx: Left foot drop  M21. 372 1 Units 0   pravastatin (PRAVACHOL) 40 MG tablet TAKE 1 TABLET BY MOUTH EVERY DAY 90 tablet 3   sildenafil (REVATIO) 20 MG tablet Take 3-5 tablets (60-100 mg total) by mouth daily as needed. 50 tablet 11   valsartan-hydrochlorothiazide (DIOVAN-HCT) 320-25 MG tablet TAKE 1 TABLET BY MOUTH EVERY DAY 90 tablet 3   No current facility-administered medications on file prior to visit.    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[facility]) 2013   Dr Evorn Gong   Pneumonia    walking back in 2002   Prostate cancer ([place]) 2019   Skin cancer 06/14/2011   melanoma and  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    Past Surgical History:  Procedure Laterality Date   COLONOSCOPY     KNEE SURGERY  1969   cartilage removal right knee    melanoma back  09/2010   PROSTATE BIOPSY     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   Procedure: LEFT TOTAL HIP ARTHROPLASTY ANTERIOR APPROACH;  Surgeon: Mcarthur Rossetti, MD;  Location: [place];  Service: Orthopedics;  Laterality: Left;    Family History  Problem Relation Age of Onset   Heart attack Mother    Stroke Father    Lung cancer Father    Prostate cancer Brother    Heart disease Brother    Diabetes Neg Hx    Colon cancer Neg Hx    Stomach cancer Neg Hx    Esophageal cancer Neg Hx    Rectal cancer Neg Hx    Colon polyps Neg Hx     Social History   Socioeconomic History   Marital status: Married    Spouse name: [name]   Number of children: 2   Years of education: Not on file   Highest education level: Not on file  Occupational History   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  Tobacco Use   Smoking status: Never    Passive exposure: Past   Smokeless tobacco: Never  Vaping Use   Vaping Use: Never used  Substance and Sexual Activity   Alcohol use: Yes    Alcohol/week: 3.0 - 4.0 standard drinks of alcohol    Types: 3 - 4 Cans of beer per week    Comment: occasional beer   Drug use: No   Sexual activity: Yes  Other Topics Concern   Not on file  Social History Narrative   Has living will   Wife is health care POA--- daughters next   Would accept resuscitation   No tube feeds if cognitively unaware   Lives with wife   retired      Right Handed    Lives in a two story home    Social Determinants of Health   Financial Resource Strain: [place]  (02/03/2021)   Overall Financial Resource Strain (CARDIA)    Difficulty of Paying Living Expenses: Not hard at all  Food Insecurity: Not on file  Transportation Needs: No Transportation Needs  (07/18/2018)   PRAPARE - Hydrologist (Medical): No    Lack of Transportation (Non-Medical): No  Physical Activity: Not on file  Stress: Not on file  Social Connections: Not on file  Intimate Partner Violence: Not on file   Review of Systems Just had colonoscopy--done now since fine Weight is up 10# from the summer---no change in eating    Objective:   Physical Exam Constitutional:      Appearance: Normal appearance.  Cardiovascular:     Rate and Rhythm: Normal rate and regular rhythm.     Pulses: Normal pulses.     Heart sounds: No murmur heard.    No gallop.  Pulmonary:     Effort: Pulmonary effort is normal.     Breath sounds: Normal breath sounds. No wheezing or rales.  Musculoskeletal:     Cervical back: Neck supple.     Right lower leg: No edema.     Left lower leg: No edema.  Lymphadenopathy:     Cervical: No cervical adenopathy.  Neurological:     Mental Status: He is alert.  Psychiatric:        Mood and Affect: Mood normal.        Behavior: Behavior normal.            Assessment & Plan:

## 2022-07-14 ENCOUNTER — Ambulatory Visit: Payer: Medicare Other | Admitting: Orthopedic Surgery

## 2022-07-18 ENCOUNTER — Encounter: Payer: Self-pay | Admitting: Orthopaedic Surgery

## 2022-07-18 ENCOUNTER — Ambulatory Visit (INDEPENDENT_AMBULATORY_CARE_PROVIDER_SITE_OTHER): Payer: Medicare Other

## 2022-07-18 ENCOUNTER — Ambulatory Visit (INDEPENDENT_AMBULATORY_CARE_PROVIDER_SITE_OTHER): Payer: Medicare Other | Admitting: Orthopaedic Surgery

## 2022-07-18 DIAGNOSIS — M25551 Pain in right hip: Secondary | ICD-10-CM | POA: Diagnosis not present

## 2022-07-18 MED ORDER — LIDOCAINE HCL 1 % IJ SOLN
3.0000 mL | INTRAMUSCULAR | Status: AC | PRN
  Administered 2022-07-18: 3 mL

## 2022-07-18 MED ORDER — METHYLPREDNISOLONE ACETATE 40 MG/ML IJ SUSP
40.0000 mg | INTRAMUSCULAR | Status: AC | PRN
  Administered 2022-07-18: 40 mg via INTRA_ARTICULAR

## 2022-07-18 NOTE — Progress Notes (Signed)
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  He is ambulate with a cane.  He recently had a trochanteric injection of his left hip area by my partner Dr. Laurance Flatten and he said that has been wonderful however he is developing significant right hip pain with time.  He denies any groin pain he points to the lateral aspect of his right hip as a source of his pain.  On exam with having him lay supine there is a leg length difference with his right hip shorter than the left.  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  His right hip has some stiffness with internal and external rotation with some pain in the groin but most of his pain seems to be over the lateral hip.  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  At this point I did recommend a steroid injection of his right hip trochanteric area which she agreed to and tolerated well.  I would like to see him back in 3 weeks and at that visit would actually like a standing AP and lateral of his right knee to see how this is into his leg length difference combined with his hip pain.      Procedure Note  Patient: Ricardo Reese             Date of Birth: Mar 02, 1951           MRN: [id number]             Visit Date: 07/18/2022  Procedures: Visit Diagnoses:  1. Pain of right hip     Large Joint Inj: R greater trochanter on 07/18/2022 4:47 PM Indications: pain and diagnostic evaluation Details: 22 G 1.5 in needle, lateral approach  Arthrogram: No  Medications: 3 mL lidocaine 1 %; 40 mg methylPREDNISolone acetate 40 MG/ML Outcome: tolerated well, no  immediate complications Procedure, treatment alternatives, risks and benefits explained, specific risks discussed. Consent was given by the patient. Immediately prior to procedure a time out was called to verify the correct patient, procedure, equipment, support staff and site/side marked as required. Patient was prepped and draped in the usual sterile fashion.

## 2022-07-27 ENCOUNTER — Other Ambulatory Visit: Payer: Self-pay | Admitting: Internal Medicine

## 2022-08-03 ENCOUNTER — Encounter: Payer: Self-pay | Admitting: Orthopaedic Surgery

## 2022-08-03 ENCOUNTER — Other Ambulatory Visit: Payer: Self-pay

## 2022-08-03 ENCOUNTER — Ambulatory Visit (INDEPENDENT_AMBULATORY_CARE_PROVIDER_SITE_OTHER): Payer: Medicare Other | Admitting: Orthopaedic Surgery

## 2022-08-03 ENCOUNTER — Ambulatory Visit: Payer: Self-pay

## 2022-08-03 DIAGNOSIS — M25551 Pain in right hip: Secondary | ICD-10-CM

## 2022-08-03 DIAGNOSIS — M7062 Trochanteric bursitis, left hip: Secondary | ICD-10-CM | POA: Diagnosis not present

## 2022-08-03 DIAGNOSIS — M21161 Varus deformity, not elsewhere classified, right knee: Secondary | ICD-10-CM

## 2022-08-03 NOTE — Progress Notes (Signed)
The patient is a pleasant 72 year old gentleman well-known to me.  We actually replaced his left hip years ago.  He has been having significant left hip pain and trochanteric bursitis and my partner Dr. Laurance Flatten placed a steroid injection around the trochanteric area which did help but his pain is returned.  At his last visit I examined his left hip and his right hip.  His right hip does show some arthritic changes and we did inject over the trochanteric area of that side and it improved dramatically.  He also has remote history of right knee surgery.  There is no replacement that knee but he has significant varus malalignment of that knee and I believe the combination of his varus malalignment the right knee combined with some arthritis in the right hip is causing leg length difference and that is slowing his gait off and contributing to pain in his left hip.  X-rays of the left hip showed a normal appearing replacement with no complicating features.  He states today that he is not having really much pain with his right hip or his right knee at all.  That is just intermittent.  His left hip pain is returning and is over the trochanteric area.  On exam again today his right knee does have varus malalignment that is slightly correctable.  There is no effusion.  There is medial joint line tenderness but is not severe.  He has excellent range of motion of that right knee.  His right hip has some stiffness and rotation but is pain-free.  A standing AP and lateral the right knee also shows left knee.  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  I would like to send him to outpatient physical therapy to see if there is any modalities that they can do over his hips especially on the left  side in terms of dry needling or other stretching that can help decrease the pain from his tendinitis and bursitis.  He agrees with this treatment plan.  Will then see him back in about 6 weeks after course of outpatient physical therapy.

## 2022-08-09 ENCOUNTER — Ambulatory Visit: Payer: Medicare Other | Admitting: Orthopaedic Surgery

## 2022-08-15 NOTE — Therapy (Unsigned)
OUTPATIENT PHYSICAL THERAPY LOWER EXTREMITY EVALUATION   Patient Name: Ricardo Reese MRN: [id number] DOB:December 23, 1950, 72 y.o., male Today's Date: 08/16/2022  END OF SESSION:  PT End of Session - 08/16/22 0956     Visit Number 1    Number of Visits 24    Date for PT Re-Evaluation 11/15/22    Authorization Type Medicare    Progress Note Due on Visit 10    PT Start Time 0847    PT Stop Time 0938    PT Time Calculation (min) 51 min    Activity Tolerance Patient tolerated treatment well    Behavior During Therapy [facility] for tasks assessed/performed             Past Medical History:  Diagnosis Date   Diabetes mellitus without complication ([place])    History of kidney stones    has had twice   Hyperlipidemia    Hypertension    Impaired fasting glucose    Kidney stone 1983   Melanoma of back [facility]) 2013   Dr Evorn Gong   Pneumonia    walking back in 2002   Prostate cancer ([place]) 2019   Skin cancer 06/14/2011   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   Past Surgical History:  Procedure Laterality Date   COLONOSCOPY     KNEE SURGERY  1969   cartilage removal right knee    melanoma back  09/2010   PROSTATE BIOPSY     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   Procedure: LEFT TOTAL HIP ARTHROPLASTY ANTERIOR APPROACH;  Surgeon: Mcarthur Rossetti, MD;  Location: [place];  Service: Orthopedics;  Laterality: Left;   Patient Active Problem List   Diagnosis Date Noted   Left foot drop 12/31/2021   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   Right knee pain 10/25/2019   History of prostate cancer 01/02/2018   Advance directive discussed with patient 09/26/2016   Unilateral primary osteoarthritis, left hip 09/20/2016   Status post left hip replacement 09/20/2016   Routine general medical examination at a health care facility 08/17/2011   History of melanoma    Hyperlipemia 05/20/2008   Benign essential hypertension 05/20/2008   Type 2 diabetes mellitus  with other circulatory complications ([place]) [id number]    PCP: Venia Carbon, MD  REFERRING PROVIDER: Mcarthur Rossetti, MD   REFERRING DIAG: Acquired varus deformity knee, 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 He states he had a L hip replacement 6 years ago and has not had issues until recently. He notes pain in his L hip with bending and flexing to the L side. He has pin point tenderness to his L bursa today with increased pain in weight bearing. Pt with "severe OA in his R hip" and is in need of a replacement per MD. He also has varus deformity of L knee that has been present for years (  since he was 72 years of age). He received a cortisone shot in his R hip which has helped significantly. Pt with continued pain in his R knee. He plays golf a lot and does not have pain with the swinging motion, but notes pain with walking.   PERTINENT HISTORY: DM, HTN, Hx of prostate CA, Hx of skin CA.  PAIN:   P1: R hip P2: R knee P3: L hip  Are you having pain? Yes: NPRS scale: P1:0/10, P2:2/10, P3: 8/10 when walking /10 Pain location: R hip, L hip and R knee.  Pain description: Tooth ache, intermittent sharp pain in L hip.  Aggravating factors: walking, squatting, bending.  Relieving factors: Rest   PRECAUTIONS: None  WEIGHT BEARING RESTRICTIONS: No  FALLS:  Has patient fallen in last 6 months? No  LIVING  ENVIRONMENT: Lives with: lives with their spouse Lives in: House/apartment Stairs: Yes: Internal: 15 steps; on right going up and External: 4 steps; on right going up Has following equipment at home: Single point cane  OCCUPATION: Retired from [employer]: Independent  PATIENT GOALS: Pt would like to strengthen his bilat LE's, minimize pain and prep for hip surgery. He would like to avoid knee surgery if possible.   OBJECTIVE:   DIAGNOSTIC FINDINGS: Pt reports severe OA in R hip and varus of R knee.   PATIENT SURVEYS:  FOTO 43.81%, 59% in 13 visits.   COGNITION: Overall cognitive status: Within functional limits for tasks assessed     SENSATION: WFL  MUSCLE LENGTH: Hamstrings: Right 80%  deg; Left WFL deg  POSTURE: No Significant postural limitations  PALPATION: L bursa in hip  LOWER EXTREMITY ROM:   ROM Right eval Left eval  Hip flexion [facility]  Hip extension    Hip abduction [facility]*  Hip internal rotation 50% 50%  Hip external rotation [facility]*  Knee flexion [facility]  Knee extension -8 (lacking) 0  Ankle dorsiflexion [facility] WFL  Ankle plantarflexion WFL WFL   (Blank rows = not tested)  LOWER EXTREMITY MMT:  MMT Right eval Left eval  Hip flexion 4- 3+  Hip extension 3 3  Hip abduction  Not tested due to pain.   Knee flexion 3+ 3  Knee extension 4- 4-   (Blank rows = not tested)  FUNCTIONAL TESTS:  5 times sit to stand: 14.22 sec  Timed up and go (TUG): 13.28 sec   GAIT: Distance walked: 71f  Assistive device utilized: Single point cane Level of assistance: Complete Independence Comments: Pt walks with an antalgic gait pattern with decreased stride length due to limited knee extension on R knee and a hip shift due to pain on L side.    TODAY'S TREATMENT:                                                                                                                              DATE: Creating, reviewing, and completing below  HEP'    PATIENT  EDUCATION:  Education details: Educated pt on anatomy and physiology of current symptoms, FOTO, diagnosis, prognosis, HEP,  and POC. Person educated: Patient Education method: Customer service manager Education comprehension: verbalized understanding and returned demonstration  HOME EXERCISE PROGRAM: Access Code: [email or web address] URL: [email or web address] Date: 08/16/2022 Prepared by: Rudi Heap  Exercises - Seated Hamstring Stretch  - 2 x daily - 7 x weekly - 2 sets - 2 reps - 30 hold - Seated Long Arc Quad  - 2 x daily - 7 x weekly - 2 sets - 10 reps - Seated March  - 2 x daily - 7 x weekly - 2 sets - 10 reps   ASSESSMENT:  CLINICAL IMPRESSION: Patient referred to PT for pain in bilat hips and R knee. He demonstrates significant weakness in bilat LE's. He has functional ROM with limitations mostly noted with hip IR and R knee extension. Pt walks with an antalgic gait pattern due to multiple issues. He is active with golf weekly, but has increased pain with weight bearing and walking. He plans to get a R hip replacement in the near future. Patient will benefit from skilled PT to address below impairments, limitations and improve overall function.  OBJECTIVE IMPAIRMENTS: decreased activity tolerance, difficulty walking, decreased balance, decreased endurance, decreased mobility, decreased ROM, decreased strength, impaired flexibility, impaired LE use, postural dysfunction, and pain.  ACTIVITY LIMITATIONS: bending, lifting, carry, locomotion, cleaning, community activity, driving, and or occupation  PERSONAL FACTORS: DM, HTN, Hx of prostate CA, Hx of skin CA are also affecting patient's functional outcome.  REHAB POTENTIAL: Good  CLINICAL DECISION MAKING: Evolving/moderate complexity  EVALUATION COMPLEXITY: Moderate    GOALS: Short term PT Goals Target date: 08/30/2022  Pt will be I and compliant with HEP. Baseline:  Goal status: New Pt will decrease L hip pain by  25% overall Baseline: Goal status: New  Long term PT goals Target date: 10/11/2022  Pt will improve hip/knee strength to at least 4+/5 MMT to improve functional strength Baseline: Goal status: New Pt will improve FOTO to at least 59% functional to show improved function Baseline: Goal status: New Pt will reduce pain L hip pain by overall 50% overall with usual activity Baseline: Goal status: New Pt will be able to ambulate community distances at least 1000 ft WNL gait pattern without complaints and appropriate AD Baseline: Goal status: New Pt will be able to bend and squat with 2-3/10 pain in his L hip  Baseline: Goal status: New Pt will improve TUG by 3.4 seconds or Minimal clinically important difference.  Baseline: Goal status: New Pt will be prepared for R hip replacement and have a clear understanding of gait pattern/ stair negotiation following surgery.  Baseline: Goal status: New   PLAN: PT FREQUENCY: 1-2 times per week   PT DURATION: 12 weeks  PLANNED INTERVENTIONS (unless contraindicated): aquatic PT, Canalith repositioning, cryotherapy, Electrical stimulation, Iontophoresis with 4 mg/ml dexamethasome, Moist heat, traction, Ultrasound, gait training, Therapeutic exercise, balance training, neuromuscular re-education, patient/family education, prosthetic training, manual techniques, passive ROM, dry needling, taping, vasopnuematic device, vestibular, spinal manipulations, joint manipulations  PLAN FOR NEXT SESSION: Assess HEP/update PRN, continue to progress functional mobility, strengthen anterior and posterior LE chain. Decrease patients pain and help minimize pain with ambulation in L hip.    Lynden Ang, PT 08/16/2022, 9:56 AM

## 2022-08-16 ENCOUNTER — Other Ambulatory Visit: Payer: Self-pay

## 2022-08-16 ENCOUNTER — Ambulatory Visit (INDEPENDENT_AMBULATORY_CARE_PROVIDER_SITE_OTHER): Payer: Medicare Other | Admitting: Physical Therapy

## 2022-08-16 ENCOUNTER — Encounter: Payer: Self-pay | Admitting: Physical Therapy

## 2022-08-16 DIAGNOSIS — M25561 Pain in right knee: Secondary | ICD-10-CM | POA: Diagnosis not present

## 2022-08-16 DIAGNOSIS — R2689 Other abnormalities of gait and mobility: Secondary | ICD-10-CM | POA: Diagnosis not present

## 2022-08-16 DIAGNOSIS — M25551 Pain in right hip: Secondary | ICD-10-CM | POA: Diagnosis not present

## 2022-08-16 DIAGNOSIS — M6281 Muscle weakness (generalized): Secondary | ICD-10-CM

## 2022-08-16 DIAGNOSIS — M25552 Pain in left hip: Secondary | ICD-10-CM | POA: Diagnosis not present

## 2022-08-16 NOTE — Therapy (Signed)
OUTPATIENT PHYSICAL THERAPY LOWER EXTREMITY TREATMENT   Patient Name: Ricardo Reese MRN: [id number] DOB:1950-09-11, 72 y.o., male Today's Date: 08/19/2022  END OF SESSION:  PT End of Session - 08/19/22 0841     Visit Number 2    Number of Visits 24    Date for PT Re-Evaluation 11/15/22    Authorization Type Medicare    Progress Note Due on Visit 10    PT Start Time 0800    PT Stop Time 0840    PT Time Calculation (min) 40 min    Activity Tolerance Patient tolerated treatment well    Behavior During Therapy [facility] for tasks assessed/performed              Past Medical History:  Diagnosis Date   Diabetes mellitus without complication ([place])    History of kidney stones    has had twice   Hyperlipidemia    Hypertension    Impaired fasting glucose    Kidney stone 1983   Melanoma of back 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   Past Surgical History:  Procedure Laterality Date   COLONOSCOPY     KNEE SURGERY  1969   cartilage removal right knee    melanoma back  09/2010   PROSTATE BIOPSY     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   Procedure: LEFT TOTAL HIP ARTHROPLASTY ANTERIOR APPROACH;  Surgeon: Mcarthur Rossetti, MD;  Location: [place];  Service: Orthopedics;  Laterality: Left;   Patient Active Problem List   Diagnosis Date Noted   Left foot drop 12/31/2021   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   Right knee pain 10/25/2019   History of prostate cancer 01/02/2018   Advance directive discussed with patient 09/26/2016   Unilateral primary osteoarthritis, left hip 09/20/2016   Status post left hip replacement 09/20/2016   Routine general medical examination at a health care facility 08/17/2011   History of melanoma    Hyperlipemia 05/20/2008   Benign essential hypertension 05/20/2008   Type 2 diabetes mellitus  with other circulatory complications ([place]) [id number]    PCP: Venia Carbon, MD  REFERRING PROVIDER: Mcarthur Rossetti, MD   REFERRING DIAG: Acquired varus deformity knee, 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   EVAL: Pt reports to PT with pain in bilat hips and R knee. He states he had a L hip replacement 6 years ago and has not had issues until recently. He notes pain in his L hip with bending and flexing to the L side. He has pin point tenderness to his L bursa today with increased pain in weight bearing. Pt with "severe OA in his R hip" and is in need of a replacement per MD. He also has varus deformity of L knee that has been present for years (since he was 72 years of age). He received a cortisone shot in his R hip which has helped significantly.  Pt with continued pain in his R knee. He plays golf a lot and does not have pain with the swinging motion, but notes pain with walking.   PERTINENT HISTORY: DM, HTN, Hx of prostate CA, Hx of skin CA.  PAIN:   P1: R hip P2: R knee P3: L hip  Are you having pain? Yes: NPRS scale: P1:0/10, P2:2/10, P3: 8/10 when walking /10 Pain location: R hip, L hip and R knee.  Pain description: Tooth ache, intermittent sharp pain in L hip.  Aggravating factors: walking, squatting, bending.  Relieving factors: Rest   PRECAUTIONS: None  WEIGHT BEARING RESTRICTIONS: No  FALLS:  Has patient fallen in last 6 months? No  LIVING ENVIRONMENT: Lives with: lives with their spouse Lives in: House/apartment Stairs: Yes: Internal: 15 steps; on right  going up and External: 4 steps; on right going up Has following equipment at home: Single point cane  OCCUPATION: Retired from [employer]: Independent  PATIENT GOALS: Pt would like to strengthen his bilat LE's, minimize pain and prep for hip surgery. He would like to avoid knee surgery if possible.   OBJECTIVE:   DIAGNOSTIC FINDINGS: Pt reports severe OA in R hip and varus of R knee.   PATIENT SURVEYS:  FOTO 43.81%, 59% in 13 visits.   COGNITION: Overall cognitive status: Within functional limits for tasks assessed     SENSATION: WFL  MUSCLE LENGTH: Hamstrings: Right 80%  deg; Left WFL deg  POSTURE: No Significant postural limitations  PALPATION: L bursa in hip  LOWER EXTREMITY ROM:   ROM Right eval Left eval  Hip flexion [facility]  Hip extension    Hip abduction [facility]*  Hip internal rotation 50% 50%  Hip external rotation [facility]*  Knee flexion [facility]  Knee extension -8 (lacking) 0  Ankle dorsiflexion [facility] WFL  Ankle plantarflexion WFL WFL   (Blank rows = not tested)  LOWER EXTREMITY MMT:  MMT Right eval Left eval  Hip flexion 4- 3+  Hip extension 3 3  Hip abduction  Not tested due to pain.   Knee flexion 3+ 3  Knee extension 4- 4-   (Blank rows = not tested)  FUNCTIONAL TESTS:  5 times sit to stand: 14.22 sec  Timed up and go (TUG): 13.28 sec   GAIT: Distance walked: 67f  Assistive device utilized: Single point cane Level of assistance: Complete Independence Comments: Pt walks with an antalgic gait pattern with decreased stride length due to limited knee extension on R knee and a hip shift due to pain on L side.    TODAY'S TREATMENT:        DATE: 08/19/2022: Nustep lvl 5, 5 min LAQ 2x10, bilat 1.5# Seated marching 2x 118m, 1.5# Squats to chair 2x15 Step up 4", bilat LE 1x15 Leg press 75#, bilat 1x20  SL press 50#, bilat 1x15  Standing hip abduction/ flexion x15, bilat  DATE: Creating, reviewing, and completing below HEP'    PATIENT EDUCATION:  Education details: Educated pt on anatomy and physiology of current symptoms, FOTO, diagnosis, prognosis, HEP,  and POC. Person educated: Patient Education method: Customer service manager Education comprehension: verbalized understanding and returned demonstration  HOME EXERCISE PROGRAM: Access Code: [email or web address] URL: [email or web address] Date: 08/16/2022 Prepared by: Rudi Heap  Exercises - Seated Hamstring Stretch  - 2 x daily - 7 x weekly - 2 sets - 2 reps - 30 hold - Seated Long Arc Quad  - 2 x daily - 7 x weekly - 2 sets - 10 reps - Seated March  - 2 x daily - 7 x weekly - 2 sets - 10 reps   ASSESSMENT:  CLINICAL IMPRESSION: Pt presents to first f/u appt with no changes. Session with focus on LE strengthening. Pt tolerated all prescribed exercises well with noted fatigue. 1x LOB with step ups's due to misstep backwards. Pt able to catch himself. He denies any increase in pain today. Pt will continue to benefit from skilled PT to address underlying weakness and gait deviations.   OBJECTIVE IMPAIRMENTS: decreased activity tolerance, difficulty walking, decreased balance, decreased endurance, decreased mobility, decreased ROM, decreased strength, impaired flexibility, impaired LE use, postural dysfunction, and pain.  ACTIVITY LIMITATIONS: bending, lifting, carry, locomotion, cleaning, community activity, driving, and or occupation  PERSONAL FACTORS: DM, HTN, Hx of prostate CA, Hx of skin CA are also affecting patient's functional outcome.  REHAB POTENTIAL: Good  CLINICAL DECISION MAKING: Evolving/moderate complexity  EVALUATION COMPLEXITY: Moderate    GOALS: Short term PT Goals Target date: 08/30/2022  Pt will be I and compliant with HEP. Baseline:  Goal status: New Pt will decrease L hip pain by 25%  overall Baseline: Goal status: New  Long term PT goals Target date: 10/11/2022  Pt will improve hip/knee strength to at least 4+/5 MMT to improve functional strength Baseline: Goal status: New Pt will improve FOTO to at least 59% functional to show improved function Baseline: Goal status: New Pt will reduce pain L hip pain by overall 50% overall with usual activity Baseline: Goal status: New Pt will be able to ambulate community distances at least 1000 ft WNL gait pattern without complaints and appropriate AD Baseline: Goal status: New Pt will be able to bend and squat with 2-3/10 pain in his L hip  Baseline: Goal status: New Pt will improve TUG by 3.4 seconds or Minimal clinically important difference.  Baseline: Goal status: New Pt will be prepared for R hip replacement and have a clear understanding of gait pattern/ stair negotiation following surgery.  Baseline: Goal status: New   PLAN: PT FREQUENCY: 1-2 times per week   PT DURATION: 12 weeks  PLANNED INTERVENTIONS (unless contraindicated): aquatic PT, Canalith repositioning, cryotherapy, Electrical stimulation, Iontophoresis with 4 mg/ml dexamethasome, Moist heat, traction, Ultrasound, gait training, Therapeutic exercise, balance training, neuromuscular re-education, patient/family education, prosthetic training, manual techniques, passive ROM, dry needling, taping, vasopnuematic device, vestibular, spinal manipulations, joint manipulations  PLAN FOR NEXT SESSION: Assess HEP/update PRN, continue to progress functional mobility, strengthen anterior and posterior LE chain. Decrease patients pain and help minimize pain with ambulation in L hip.    Lynden Ang, PT 08/19/2022, 8:42 AM

## 2022-08-18 ENCOUNTER — Encounter: Payer: Self-pay | Admitting: Radiology

## 2022-08-19 ENCOUNTER — Ambulatory Visit (INDEPENDENT_AMBULATORY_CARE_PROVIDER_SITE_OTHER): Payer: Medicare Other | Admitting: Physical Therapy

## 2022-08-19 ENCOUNTER — Encounter: Payer: Self-pay | Admitting: Physical Therapy

## 2022-08-19 DIAGNOSIS — M6281 Muscle weakness (generalized): Secondary | ICD-10-CM | POA: Diagnosis not present

## 2022-08-19 DIAGNOSIS — R2689 Other abnormalities of gait and mobility: Secondary | ICD-10-CM | POA: Diagnosis not present

## 2022-08-19 DIAGNOSIS — M25551 Pain in right hip: Secondary | ICD-10-CM | POA: Diagnosis not present

## 2022-08-19 DIAGNOSIS — M25552 Pain in left hip: Secondary | ICD-10-CM

## 2022-08-19 DIAGNOSIS — M25561 Pain in right knee: Secondary | ICD-10-CM | POA: Diagnosis not present

## 2022-08-24 ENCOUNTER — Encounter: Payer: Medicare Other | Admitting: Physical Therapy

## 2022-08-26 ENCOUNTER — Encounter: Payer: Self-pay | Admitting: Physical Therapy

## 2022-08-26 ENCOUNTER — Ambulatory Visit (INDEPENDENT_AMBULATORY_CARE_PROVIDER_SITE_OTHER): Payer: Medicare Other | Admitting: Physical Therapy

## 2022-08-26 DIAGNOSIS — M25561 Pain in right knee: Secondary | ICD-10-CM | POA: Diagnosis not present

## 2022-08-26 DIAGNOSIS — M25552 Pain in left hip: Secondary | ICD-10-CM | POA: Diagnosis not present

## 2022-08-26 DIAGNOSIS — R2689 Other abnormalities of gait and mobility: Secondary | ICD-10-CM

## 2022-08-26 DIAGNOSIS — M25551 Pain in right hip: Secondary | ICD-10-CM

## 2022-08-26 DIAGNOSIS — M6281 Muscle weakness (generalized): Secondary | ICD-10-CM | POA: Diagnosis not present

## 2022-08-26 NOTE — Therapy (Signed)
OUTPATIENT PHYSICAL THERAPY LOWER EXTREMITY TREATMENT   Patient Name: Ricardo Reese MRN: [id number] DOB:17-Feb-1951, 72 y.o., male Today's Date: 08/26/2022  END OF SESSION:  PT End of Session - 08/26/22 1448     Visit Number 3    Number of Visits 24    Date for PT Re-Evaluation 11/15/22    Authorization Type Medicare    Progress Note Due on Visit 10    PT Start Time 1433    PT Stop Time 1513    PT Time Calculation (min) 40 min    Activity Tolerance Patient tolerated treatment well    Behavior During Therapy WFL for tasks assessed/performed               Past Medical History:  Diagnosis Date   Diabetes mellitus without complication ([place])    History of kidney stones    has had twice   Hyperlipidemia    Hypertension    Impaired fasting glucose    Kidney stone 1983   Melanoma of back [facility]) 2013   Dr Evorn Gong   Pneumonia    walking back in 2002   Prostate cancer ([place]) 2019   Skin cancer 06/14/2011   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   Past Surgical History:  Procedure Laterality Date   COLONOSCOPY     KNEE SURGERY  1969   cartilage removal right knee    melanoma back  09/2010   PROSTATE BIOPSY     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   Procedure: LEFT TOTAL HIP ARTHROPLASTY ANTERIOR APPROACH;  Surgeon: Mcarthur Rossetti, MD;  Location: [place];  Service: Orthopedics;  Laterality: Left;   Patient Active Problem List   Diagnosis Date Noted   Left foot drop 12/31/2021   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   Right knee pain 10/25/2019   History of prostate cancer 01/02/2018   Advance directive discussed with patient 09/26/2016   Unilateral primary osteoarthritis, left hip 09/20/2016   Status post left hip replacement 09/20/2016   Routine general medical examination at a health care facility 08/17/2011   History of melanoma    Hyperlipemia 05/20/2008   Benign essential hypertension 05/20/2008   Type 2 diabetes mellitus  with other circulatory complications ([place]) A999333    PCP: Venia Carbon, MD  REFERRING PROVIDER: Mcarthur Rossetti, MD   REFERRING DIAG: Acquired varus deformity knee, 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 Have been doing everything I can to do what Naaman Plummer told me to do. Walking in grass feels great, cement can still be a little painful. Golf is still not that comfortable.   EVAL: Pt reports to PT with pain in bilat hips and R knee. He states he had a L hip replacement 6 years ago and has not had issues until recently. He notes pain in his L hip with bending and flexing to the L side. He has pin point tenderness to his L bursa today with increased pain in weight bearing. Pt with "severe OA in his R hip" and is in need of a replacement per MD.  He also has varus deformity of L knee that has been present for years (since he was 72 years of age). He received a cortisone shot in his R hip which has helped significantly. Pt with continued pain in his R knee. He plays golf a lot and does not have pain with the swinging motion, but notes pain with walking.   PERTINENT HISTORY: DM, HTN, Hx of prostate CA, Hx of skin CA.  PAIN:   P1: R hip P2: R knee P3: L hip  Are you having pain? Yes: NPRS scale: P1:0/10, P2:0/10, P3: 2/10 when walking /10 Pain location: R hip, L hip and R knee.  Pain description: Tooth ache, intermittent sharp pain in L hip.  Aggravating factors: walking, squatting, bending, twisting and bending a certain way  Relieving factors: Rest   PRECAUTIONS: None  WEIGHT  BEARING RESTRICTIONS: No  FALLS:  Has patient fallen in last 6 months? No  LIVING ENVIRONMENT: Lives with: lives with their spouse Lives in: House/apartment Stairs: Yes: Internal: 15 steps; on right going up and External: 4 steps; on right going up Has following equipment at home: Single point cane  OCCUPATION: Retired from [employer]: Independent  PATIENT GOALS: Pt would like to strengthen his bilat LE's, minimize pain and prep for hip surgery. He would like to avoid knee surgery if possible.   OBJECTIVE:   DIAGNOSTIC FINDINGS: Pt reports severe OA in R hip and varus of R knee.   PATIENT SURVEYS:  FOTO 43.81%, 59% in 13 visits.   COGNITION: Overall cognitive status: Within functional limits for tasks assessed     SENSATION: WFL  MUSCLE LENGTH: Hamstrings: Right 80%  deg; Left WFL deg  POSTURE: No Significant postural limitations  PALPATION: L bursa in hip  LOWER EXTREMITY ROM:   ROM Right eval Left eval  Hip flexion [facility]  Hip extension    Hip abduction [facility]*  Hip internal rotation 50% 50%  Hip external rotation [facility]*  Knee flexion [facility]  Knee extension -8 (lacking) 0  Ankle dorsiflexion [facility] WFL  Ankle plantarflexion WFL WFL   (Blank rows = not tested)  LOWER EXTREMITY MMT:  MMT Right eval Left eval  Hip flexion 4- 3+  Hip extension 3 3  Hip abduction  Not tested due to pain.   Knee flexion 3+ 3  Knee extension 4- 4-   (Blank rows = not tested)  FUNCTIONAL TESTS:  5 times sit to stand: 14.22 sec  Timed up and go (TUG): 13.28 sec   GAIT: Distance walked: 65ft  Assistive device utilized: Single point cane Level of assistance: Complete Independence Comments: Pt walks with an antalgic gait pattern with decreased stride length due to limited knee extension on R knee and a hip shift due to pain on L side.    TODAY'S TREATMENT:        DATE:   08/26/22  TherEx  Nustep L5x6 minutes BLEs only  Shuttle LE press 87# x20  BLEs Shuttle LE press 62# x12 single leg B Squats in front of chair 5# goblet hold x12 Forward step ups 6 inch box in // bars x15 B Lateral step ups 6 inch box in // bars x15 B Single leg bridge x10 B Walking bridges x10  Sidelying clams x15 B red TB  LAQs 3# 2x10 B with 3 second holds    08/19/2022: Nustep lvl 5, 5 min LAQ 2x10, bilat 1.5# Seated marching 2x 30min, 1.5# Squats to chair  2x15 Step up 4", bilat LE 1x15 Leg press 75#, bilat 1x20  SL press 50#, bilat 1x15  Standing hip abduction/ flexion x15, bilat                                                                                                                         DATE: Creating, reviewing, and completing below HEP'    PATIENT EDUCATION:  Education details: Educated pt on anatomy and physiology of current symptoms, FOTO, diagnosis, prognosis, HEP,  and POC. Person educated: Patient Education method: Customer service manager Education comprehension: verbalized understanding and returned demonstration  HOME EXERCISE PROGRAM: Access Code: [email or web address] URL: [email or web address] Date: 08/16/2022 Prepared by: Rudi Heap  Exercises - Seated Hamstring Stretch  - 2 x daily - 7 x weekly - 2 sets - 2 reps - 30 hold - Seated Long Arc Quad  - 2 x daily - 7 x weekly - 2 sets - 10 reps - Seated March  - 2 x daily - 7 x weekly - 2 sets - 10 reps   ASSESSMENT:  CLINICAL IMPRESSION:   Maddox arrives today doing well, felt good enough to come to PT without his cane and tells me he feels he is really starting to make some decent progress. Continued working on functional strengthening as this seems to have been successful thus far. Progressed all activities as able and tolerated.  OBJECTIVE IMPAIRMENTS: decreased activity tolerance, difficulty walking, decreased balance, decreased endurance, decreased mobility, decreased ROM, decreased strength, impaired flexibility, impaired LE use, postural dysfunction, and  pain.  ACTIVITY LIMITATIONS: bending, lifting, carry, locomotion, cleaning, community activity, driving, and or occupation  PERSONAL FACTORS: DM, HTN, Hx of prostate CA, Hx of skin CA are also affecting patient's functional outcome.  REHAB POTENTIAL: Good  CLINICAL DECISION MAKING: Evolving/moderate complexity  EVALUATION COMPLEXITY: Moderate    GOALS: Short term PT Goals Target date: 08/30/2022  Pt will be I and compliant with HEP. Baseline:  Goal status: New Pt will decrease L hip pain by 25% overall Baseline: Goal status: New  Long term PT goals Target date: 10/11/2022  Pt will improve hip/knee strength to at least 4+/5 MMT to improve functional strength Baseline: Goal status: New Pt will improve FOTO to at least 59% functional to show improved function Baseline: Goal status: New Pt will reduce pain L hip pain by overall 50% overall with usual activity Baseline: Goal status: New Pt will be able to ambulate community distances at least 1000 ft WNL gait pattern without complaints and appropriate AD Baseline: Goal status: New Pt will be able to bend and squat with 2-3/10 pain in his L hip  Baseline: Goal status: New Pt will improve TUG by 3.4 seconds or Minimal clinically important difference.  Baseline: Goal status: New Pt will be prepared for R hip replacement and have a clear understanding of gait pattern/ stair negotiation following surgery.  Baseline: Goal status: New   PLAN: PT FREQUENCY: 1-2 times per week   PT DURATION:  12 weeks  PLANNED INTERVENTIONS (unless contraindicated): aquatic PT, Canalith repositioning, cryotherapy, Electrical stimulation, Iontophoresis with 4 mg/ml dexamethasome, Moist heat, traction, Ultrasound, gait training, Therapeutic exercise, balance training, neuromuscular re-education, patient/family education, prosthetic training, manual techniques, passive ROM, dry needling, taping, vasopnuematic device, vestibular, spinal  manipulations, joint manipulations  PLAN FOR NEXT SESSION: Assess HEP/update PRN, continue to progress functional mobility, strengthen anterior and posterior LE chain. Decrease patients pain and help minimize pain with ambulation in L hip. Consider HEP update next session  Deniece Ree PT DPT PN2

## 2022-08-29 ENCOUNTER — Encounter: Payer: Self-pay | Admitting: Physical Therapy

## 2022-08-29 ENCOUNTER — Ambulatory Visit (INDEPENDENT_AMBULATORY_CARE_PROVIDER_SITE_OTHER): Payer: Medicare Other | Admitting: Physical Therapy

## 2022-08-29 DIAGNOSIS — M25551 Pain in right hip: Secondary | ICD-10-CM | POA: Diagnosis not present

## 2022-08-29 DIAGNOSIS — M25552 Pain in left hip: Secondary | ICD-10-CM

## 2022-08-29 DIAGNOSIS — R2689 Other abnormalities of gait and mobility: Secondary | ICD-10-CM | POA: Diagnosis not present

## 2022-08-29 DIAGNOSIS — M6281 Muscle weakness (generalized): Secondary | ICD-10-CM

## 2022-08-29 DIAGNOSIS — M25561 Pain in right knee: Secondary | ICD-10-CM

## 2022-08-29 NOTE — Therapy (Signed)
OUTPATIENT PHYSICAL THERAPY LOWER EXTREMITY TREATMENT   Patient Name: Ricardo Reese MRN: [id number] DOB:Jul 10, 1950, 72 y.o., male Today's Date: 08/29/2022  END OF SESSION:  PT End of Session - 08/29/22 0833     Visit Number 4    Number of Visits 24    Date for PT Re-Evaluation 11/15/22    Authorization Type Medicare    Progress Note Due on Visit 10    PT Start Time 0800    PT Stop Time 0840    PT Time Calculation (min) 40 min    Activity Tolerance Patient tolerated treatment well    Behavior During Therapy [facility] for tasks assessed/performed                Past Medical History:  Diagnosis Date   Diabetes mellitus without complication ([place])    History of kidney stones    has had twice   Hyperlipidemia    Hypertension    Impaired fasting glucose    Kidney stone 1983   Melanoma of back [facility]) 2013   Dr Evorn Gong   Pneumonia    walking back in 2002   Pro[place] cancer ([place]) 2019   Skin cancer 06/14/2011   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   Past Surgical History:  Procedure Laterality Date   COLONOSCOPY     KNEE SURGERY  1969   cartilage removal right knee    melanoma back  09/2010   PROSTATE BIOPSY     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   Procedure: LEFT TOTAL HIP ARTHROPLASTY ANTERIOR APPROACH;  Surgeon: Mcarthur Rossetti, MD;  Location: [place];  Service: Orthopedics;  Laterality: Left;   Patient Active Problem List   Diagnosis Date Noted   Left foot drop 12/31/2021   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   Right knee pain 10/25/2019   History of prostate cancer 01/02/2018   Advance directive discussed with patient 09/26/2016   Unilateral primary osteoarthritis, left hip 09/20/2016   Status post left hip replacement 09/20/2016   Routine general medical examination at a health care facility 08/17/2011   History of melanoma    Hyperlipemia 05/20/2008   Benign essential hypertension 05/20/2008   Type 2 diabetes  mellitus with other circulatory complications ([place]) [id number]    PCP: Venia Carbon, MD  REFERRING PROVIDER: Mcarthur Rossetti, MD   REFERRING DIAG: Acquired varus deformity knee, 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  EVAL: Pt reports to PT with pain in bilat hips and R knee. He states he had a L hip replacement 6 years ago and has not had issues until recently. He notes pain in his L hip with bending and flexing to the L side. He has pin point tenderness to his L bursa today with increased pain in weight bearing. Pt with "severe OA in his R hip" and is in need of a replacement per MD. He also has varus deformity of L knee that has been present for years (since he was 72 years of age). He received a cortisone shot in  his R hip which has helped significantly. Pt with continued pain in his R knee. He plays golf a lot and does not have pain with the swinging motion, but notes pain with walking.   PERTINENT HISTORY: DM, HTN, Hx of prostate CA, Hx of skin CA.  PAIN:   P1: R hip P2: R knee P3: L hip  Are you having pain? Yes: NPRS scale: does not provide number today/10 Pain location: R hip, L hip and R knee.  Pain description: Tooth ache, intermittent sharp pain in L hip.  Aggravating factors: walking, squatting, bending, twisting and bending a certain way  Relieving factors: Rest   PRECAUTIONS: None  WEIGHT BEARING RESTRICTIONS: No  FALLS:  Has patient fallen in last 6 months? No  LIVING ENVIRONMENT: Lives with: lives with their spouse Lives in:  House/apartment Stairs: Yes: Internal: 15 steps; on right going up and External: 4 steps; on right going up Has following equipment at home: Single point cane  OCCUPATION: Retired from [employer]: Independent  PATIENT GOALS: Pt would like to strengthen his bilat LE's, minimize pain and prep for hip surgery. He would like to avoid knee surgery if possible.   OBJECTIVE:   DIAGNOSTIC FINDINGS: Pt reports severe OA in R hip and varus of R knee.   PATIENT SURVEYS:  FOTO 43.81%, 59% in 13 visits.   COGNITION: Overall cognitive status: Within functional limits for tasks assessed     SENSATION: WFL  MUSCLE LENGTH: Hamstrings: Right 80%  deg; Left WFL deg  POSTURE: No Significant postural limitations  PALPATION: L bursa in hip  LOWER EXTREMITY ROM:   ROM Right eval Left eval  Hip flexion [facility]  Hip extension    Hip abduction [facility]*  Hip internal rotation 50% 50%  Hip external rotation [facility]*  Knee flexion [facility]  Knee extension -8 (lacking) 0  Ankle dorsiflexion [facility] WFL  Ankle plantarflexion WFL WFL   (Blank rows = not tested)  LOWER EXTREMITY MMT:  MMT Right eval Left eval  Hip flexion 4- 3+  Hip extension 3 3  Hip abduction  Not tested due to pain.   Knee flexion 3+ 3  Knee extension 4- 4-   (Blank rows = not tested)  FUNCTIONAL TESTS:  5 times sit to stand: 14.22 sec  Timed up and go (TUG): 13.28 sec   GAIT: Distance walked: 16ft  Assistive device utilized: Single point cane Level of assistance: Complete Independence Comments: Pt walks with an antalgic gait pattern with decreased stride length due to limited knee extension on R knee and a hip shift due to pain on L side.    TODAY'S TREATMENT:        DATE:   08/29/22  TherEx  Nustep L5x6 minutes BLEs only  Shuttle LE press 87# x20 BLEs Shuttle LE press 62# 2 x12 single leg B Squats in front of chair 5# goblet hold x12 Forward step ups 6 inch box in // bars x15 B Lateral step ups 6  inch box in // bars x15 B Standing hip abd with red band X 15 bilat Standing hip ext with red band X 15 bilat Standing hip flexion with red band X 15 bilat Single leg bridge x10 B Walking bridges x10  Sidelying clams x15 B red TB  LAQs 4# 2x10 B with 3 second holds   08/26/22  TherEx  Nustep L5x6 minutes BLEs only  Shuttle LE press 87# x20 BLEs Shuttle LE press 62#  x12 single leg B Squats in front of chair 5# goblet hold x12 Forward step ups 6 inch box in // bars x15 B Lateral step ups 6 inch box in // bars x15 B Single leg bridge x10 B Walking bridges x10  Sidelying clams x15 B red TB  LAQs 3# 2x10 B with 3 second holds    08/19/2022: Nustep lvl 5, 5 min LAQ 2x10, bilat 1.5# Seated marching 2x 41min, 1.5# Squats to chair 2x15 Step up 4", bilat LE 1x15 Leg press 75#, bilat 1x20  SL press 50#, bilat 1x15  Standing hip abduction/ flexion x15, bilat                                                                                                                         DATE: Creating, reviewing, and completing below HEP'    PATIENT EDUCATION:  Education details: Educated pt on anatomy and physiology of current symptoms, FOTO, diagnosis, prognosis, HEP,  and POC. Person educated: Patient Education method: Customer service manager Education comprehension: verbalized understanding and returned demonstration  HOME EXERCISE PROGRAM: Access Code: [email or web address] URL: [email or web address] Date: 08/16/2022 Prepared by: Rudi Heap  Exercises - Seated Hamstring Stretch  - 2 x daily - 7 x weekly - 2 sets - 2 reps - 30 hold - Seated Long Arc Quad  - 2 x daily - 7 x weekly - 2 sets - 10 reps - Seated March  - 2 x daily - 7 x weekly - 2 sets - 10 reps   ASSESSMENT:  CLINICAL IMPRESSION: Progressed all activities as able with good overall tolerance. He is responding well to strength program and will continue to benefit from this.   OBJECTIVE IMPAIRMENTS: decreased  activity tolerance, difficulty walking, decreased balance, decreased endurance, decreased mobility, decreased ROM, decreased strength, impaired flexibility, impaired LE use, postural dysfunction, and pain.  ACTIVITY LIMITATIONS: bending, lifting, carry, locomotion, cleaning, community activity, driving, and or occupation  PERSONAL FACTORS: DM, HTN, Hx of prostate CA, Hx of skin CA are also affecting patient's functional outcome.  REHAB POTENTIAL: Good  CLINICAL DECISION MAKING: Evolving/moderate complexity  EVALUATION COMPLEXITY: Moderate    GOALS: Short term PT Goals Target date: 08/30/2022  Pt will be I and compliant with HEP. Baseline:  Goal status: New Pt will decrease L hip pain by 25% overall Baseline: Goal status: New  Long term PT goals Target date: 10/11/2022  Pt will improve hip/knee strength to at least 4+/5 MMT to improve functional strength Baseline: Goal status: New Pt will improve FOTO to at least 59% functional to show improved function Baseline: Goal status: New Pt will reduce pain L hip pain by overall 50% overall with usual activity Baseline: Goal status: New Pt will be able to ambulate community distances at least 1000 ft WNL gait pattern without complaints and appropriate AD Baseline: Goal status: New Pt will be able to bend and squat with 2-3/10 pain in his L hip  Baseline: Goal status: New Pt will improve  TUG by 3.4 seconds or Minimal clinically important difference.  Baseline: Goal status: New Pt will be prepared for R hip replacement and have a clear understanding of gait pattern/ stair negotiation following surgery.  Baseline: Goal status: New   PLAN: PT FREQUENCY: 1-2 times per week   PT DURATION: 12 weeks  PLANNED INTERVENTIONS (unless contraindicated): aquatic PT, Canalith repositioning, cryotherapy, Electrical stimulation, Iontophoresis with 4 mg/ml dexamethasome, Moist heat, traction, Ultrasound, gait training, Therapeutic exercise,  balance training, neuromuscular re-education, patient/family education, prosthetic training, manual techniques, passive ROM, dry needling, taping, vasopnuematic device, vestibular, spinal manipulations, joint manipulations  PLAN FOR NEXT SESSION: Assess HEP/update PRN, continue to progress functional mobility, strengthen anterior and posterior LE chain. Decrease patients pain and help minimize pain with ambulation in L hip. Consider HEP update next session  Elsie Ra, PT, DPT 08/29/22 8:33 AM

## 2022-08-31 ENCOUNTER — Ambulatory Visit (INDEPENDENT_AMBULATORY_CARE_PROVIDER_SITE_OTHER): Payer: Medicare Other | Admitting: Physical Therapy

## 2022-08-31 ENCOUNTER — Encounter: Payer: Self-pay | Admitting: Physical Therapy

## 2022-08-31 DIAGNOSIS — R2689 Other abnormalities of gait and mobility: Secondary | ICD-10-CM

## 2022-08-31 DIAGNOSIS — M6281 Muscle weakness (generalized): Secondary | ICD-10-CM

## 2022-08-31 DIAGNOSIS — M25551 Pain in right hip: Secondary | ICD-10-CM

## 2022-08-31 DIAGNOSIS — M25561 Pain in right knee: Secondary | ICD-10-CM

## 2022-08-31 DIAGNOSIS — M25552 Pain in left hip: Secondary | ICD-10-CM | POA: Diagnosis not present

## 2022-08-31 NOTE — Therapy (Signed)
OUTPATIENT PHYSICAL THERAPY LOWER EXTREMITY TREATMENT   Patient Name: Ricardo Reese MRN: [id number] DOB:[birth year], 72 y.o., male Today's Date: 08/31/2022  END OF SESSION:  PT End of Session - 08/31/22 0847     Visit Number 5    Number of Visits 24    Date for PT Re-Evaluation 11/15/22    Authorization Type Medicare    Progress Note Due on Visit 10    PT Start Time 0802    PT Stop Time [id number]    PT Time Calculation (min) 41 min    Activity Tolerance Patient tolerated treatment well    Behavior During Therapy [facility] for tasks assessed/performed                 Past Medical History:  Diagnosis Date   Diabetes mellitus without complication ([place])    History of kidney stones    has had twice   Hyperlipidemia    Hypertension    Impaired fasting glucose    Kidney stone 1983   Melanoma of back [facility]) 2013   Dr Evorn Gong   Pneumonia    walking back in 2002   Prostate cancer ([place]) 2019   Skin cancer 06/14/2011   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   Past Surgical History:  Procedure Laterality Date   COLONOSCOPY     KNEE SURGERY  1969   cartilage removal right knee    melanoma back  09/2010   PROSTATE BIOPSY     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   Procedure: LEFT TOTAL HIP ARTHROPLASTY ANTERIOR APPROACH;  Surgeon: Mcarthur Rossetti, MD;  Location: [place];  Service: Orthopedics;  Laterality: Left;   Patient Active Problem List   Diagnosis Date Noted   Left foot drop 12/31/2021   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   Right knee pain 10/25/2019   History of prostate cancer 01/02/2018   Advance directive discussed with patient 09/26/2016   Unilateral primary osteoarthritis, left hip 09/20/2016   Status post left hip replacement 09/20/2016   Routine general medical examination at a health care facility 08/17/2011   History of melanoma    Hyperlipemia 05/20/2008   Benign essential hypertension 05/20/2008   Type 2 diabetes  mellitus with other circulatory complications ([place]) [id number]    PCP: Venia Carbon, MD  REFERRING PROVIDER: Mcarthur Rossetti, MD   REFERRING DIAG: Acquired varus deformity knee, 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 He was able to golf 12 holes yesterday without complaints.   EVAL: Pt reports to PT with pain in bilat hips and R knee. He states he had a L hip replacement 6 years ago and has not had issues until recently. He notes pain in his L hip with bending and flexing to the L side. He has pin point tenderness to his L bursa today with increased pain in weight bearing. Pt with "severe OA in his R hip" and is in need of a replacement per MD. He also has varus deformity of L knee that has been present for years (since he was 72 years of age).  He received a cortisone shot in his R hip which has helped significantly. Pt with continued pain in his R knee. He plays golf a lot and does not have pain with the swinging motion, but notes pain with walking.   PERTINENT HISTORY: DM, HTN, Hx of prostate CA, Hx of skin CA.  PAIN:   P1: R hip P2: R knee P3: L hip  Are you having pain? Yes: NPRS scale: does not provide number today/10 Pain location: R hip, L hip and R knee.  Pain description: Tooth ache, intermittent sharp pain in L hip.  Aggravating factors: walking, squatting, bending, twisting and bending a certain way  Relieving factors: Rest   PRECAUTIONS: None  WEIGHT BEARING RESTRICTIONS: No  FALLS:  Has patient fallen in last 6 months? No  LIVING ENVIRONMENT: Lives  with: lives with their spouse Lives in: House/apartment Stairs: Yes: Internal: 15 steps; on right going up and External: 4 steps; on right going up Has following equipment at home: Single point cane  OCCUPATION: Retired from [employer]: Independent  PATIENT GOALS: Pt would like to strengthen his bilat LE's, minimize pain and prep for hip surgery. He would like to avoid knee surgery if possible.   OBJECTIVE:   DIAGNOSTIC FINDINGS: Pt reports severe OA in R hip and varus of R knee.   PATIENT SURVEYS:  FOTO 43.81%, 59% in 13 visits.   COGNITION: Overall cognitive status: Within functional limits for tasks assessed     SENSATION: WFL  MUSCLE LENGTH: Hamstrings: Right 80%  deg; Left WFL deg  POSTURE: No Significant postural limitations  PALPATION: L bursa in hip  LOWER EXTREMITY ROM:   ROM Right eval Left eval  Hip flexion [facility]  Hip extension    Hip abduction [facility]*  Hip internal rotation 50% 50%  Hip external rotation [facility]*  Knee flexion [facility]  Knee extension -8 (lacking) 0  Ankle dorsiflexion [facility] WFL  Ankle plantarflexion WFL WFL   (Blank rows = not tested)  LOWER EXTREMITY MMT:  MMT Right eval Left eval  Hip flexion 4- 3+  Hip extension 3 3  Hip abduction  Not tested due to pain.   Knee flexion 3+ 3  Knee extension 4- 4-   (Blank rows = not tested)  FUNCTIONAL TESTS:  5 times sit to stand: 14.22 sec  Timed up and go (TUG): 13.28 sec   GAIT: Distance walked: 41ft  Assistive device utilized: Single point cane Level of assistance: Complete Independence Comments: Pt walks with an antalgic gait pattern with decreased stride length due to limited knee extension on R knee and a hip shift due to pain on L side.    TODAY'S TREATMENT:        DATE:  08/31/22  TherEx  Recumbent bike L3 X 8 min Shuttle LE press 87# x20 BLEs Shuttle LE press 62# 2 x12 single leg B Squats in front of chair 5# goblet hold x12 Forward step ups 6 inch box in  // bars x15 B Lateral step ups 6 inch box in // bars x15 B Standing hip abd with red band X 15 bilat Standing hip ext with red band X 15 bilat Standing hip flexion with red band X 15 bilat Single leg bridge x10 B Walking bridges x10  Sidelying clams x15 B red TB    08/29/22  TherEx  Nustep L5x6 minutes BLEs only  Shuttle LE press 87# x20 BLEs Shuttle LE press 62# 2 x12 single  leg B Squats in front of chair 5# goblet hold x12 Forward step ups 6 inch box in // bars x15 B Lateral step ups 6 inch box in // bars x15 B Standing hip abd with red band X 15 bilat Standing hip ext with red band X 15 bilat Standing hip flexion with red band X 15 bilat Single leg bridge x10 B Walking bridges x10  Sidelying clams x15 B red TB  LAQs 4# 2x10 B with 3 second holds   08/26/22  TherEx  Nustep L5x6 minutes BLEs only  Shuttle LE press 87# x20 BLEs Shuttle LE press 62# x12 single leg B Squats in front of chair 5# goblet hold x12 Forward step ups 6 inch box in // bars x15 B Lateral step ups 6 inch box in // bars x15 B Single leg bridge x10 B Walking bridges x10  Sidelying clams x15 B red TB  LAQs 3# 2x10 B with 3 second holds    PATIENT EDUCATION:  Education details: Educated pt on anatomy and physiology of current symptoms, FOTO, diagnosis, prognosis, HEP,  and POC. Person educated: Patient Education method: Customer service manager Education comprehension: verbalized understanding and returned demonstration  HOME EXERCISE PROGRAM: Access Code: [email or web address] URL: [email or web address] Date: 08/16/2022 Prepared by: Rudi Heap  Exercises - Seated Hamstring Stretch  - 2 x daily - 7 x weekly - 2 sets - 2 reps - 30 hold - Seated Long Arc Quad  - 2 x daily - 7 x weekly - 2 sets - 10 reps - Seated March  - 2 x daily - 7 x weekly - 2 sets - 10 reps   ASSESSMENT:  CLINICAL IMPRESSION: He showed good tolerance to his exercise program today focused on hip/knee strength.  He was not having pain upon arrival and it is a good sign that he was able to play golf for 12 holes without complaints. He has now met his short term PT goals.   OBJECTIVE IMPAIRMENTS: decreased activity tolerance, difficulty walking, decreased balance, decreased endurance, decreased mobility, decreased ROM, decreased strength, impaired flexibility, impaired LE use, postural dysfunction, and pain.  ACTIVITY LIMITATIONS: bending, lifting, carry, locomotion, cleaning, community activity, driving, and or occupation  PERSONAL FACTORS: DM, HTN, Hx of prostate CA, Hx of skin CA are also affecting patient's functional outcome.  REHAB POTENTIAL: Good  CLINICAL DECISION MAKING: Evolving/moderate complexity  EVALUATION COMPLEXITY: Moderate    GOALS: Short term PT Goals Target date: 08/30/2022  Pt will be I and compliant with HEP. Baseline:  Goal status:MET 08/31/22 Pt will decrease L hip pain by 25% overall Baseline: Goal status: MET 08/31/22  Long term PT goals Target date: 10/11/2022  Pt will improve hip/knee strength to at least 4+/5 MMT to improve functional strength Baseline: Goal status: ongoing 08/31/22 Pt will improve FOTO to at least 59% functional to show improved function Baseline: Goal status: ongoing 08/31/22 Pt will reduce pain L hip pain by overall 50% overall with usual activity Baseline: Goal status: ongoing 08/31/22 Pt will be able to ambulate community distances at least 1000 ft WNL gait pattern without complaints and appropriate AD Baseline: Goal status: ongoing 08/31/22 Pt will be able to bend and squat with 2-3/10 pain in his L hip  Baseline: Goal status: ongoing 08/31/22 Pt will improve TUG by 3.4 seconds or Minimal clinically important difference.  Baseline: Goal status: ongoing 08/31/22 Pt will be prepared for R hip replacement and have a clear understanding of gait pattern/ stair negotiation following surgery.  Baseline:  Goal status: ongoing  08/31/22   PLAN: PT FREQUENCY: 1-2 times per week   PT DURATION: 12 weeks  PLANNED INTERVENTIONS (unless contraindicated): aquatic PT, Canalith repositioning, cryotherapy, Electrical stimulation, Iontophoresis with 4 mg/ml dexamethasome, Moist heat, traction, Ultrasound, gait training, Therapeutic exercise, balance training, neuromuscular re-education, patient/family education, prosthetic training, manual techniques, passive ROM, dry needling, taping, vasopnuematic device, vestibular, spinal manipulations, joint manipulations  PLAN FOR NEXT SESSION: Assess HEP/update PRN, continue to progress functional mobility, strengthen anterior and posterior LE chain. Decrease patients pain and help minimize pain with ambulation in L hip.   Elsie Ra, PT, DPT 08/31/22 8:47 AM

## 2022-09-05 ENCOUNTER — Ambulatory Visit (INDEPENDENT_AMBULATORY_CARE_PROVIDER_SITE_OTHER): Payer: Medicare Other | Admitting: Physical Therapy

## 2022-09-05 ENCOUNTER — Encounter: Payer: Self-pay | Admitting: Physical Therapy

## 2022-09-05 DIAGNOSIS — M25551 Pain in right hip: Secondary | ICD-10-CM

## 2022-09-05 DIAGNOSIS — M25552 Pain in left hip: Secondary | ICD-10-CM

## 2022-09-05 DIAGNOSIS — R2689 Other abnormalities of gait and mobility: Secondary | ICD-10-CM | POA: Diagnosis not present

## 2022-09-05 DIAGNOSIS — M25561 Pain in right knee: Secondary | ICD-10-CM

## 2022-09-05 DIAGNOSIS — M6281 Muscle weakness (generalized): Secondary | ICD-10-CM

## 2022-09-05 NOTE — Therapy (Addendum)
OUTPATIENT PHYSICAL THERAPY LOWER EXTREMITY TREATMENT   Patient Name: Ricardo Reese MRN: [id number] DOB:[birth year], 72 y.o., male Today's Date: 09/05/2022  END OF SESSION:  PT End of Session - 09/05/22 0940     Visit Number 6    Number of Visits 24    Date for PT Re-Evaluation 11/15/22    Authorization Type Medicare    Progress Note Due on Visit 10    PT Start Time 0803    PT Stop Time 0845    PT Time Calculation (min) 42 min    Activity Tolerance Patient tolerated treatment well    Behavior During Therapy [facility] for tasks assessed/performed                  Past Medical History:  Diagnosis Date   Diabetes mellitus without complication (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   Past Surgical History:  Procedure Laterality Date   COLONOSCOPY     KNEE SURGERY  1969   cartilage removal right knee    melanoma back  09/2010   PROSTATE BIOPSY     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   Procedure: LEFT TOTAL HIP ARTHROPLASTY ANTERIOR APPROACH;  Surgeon: Mcarthur Rossetti, MD;  Location: [place];  Service: Orthopedics;  Laterality: Left;   Patient Active Problem List   Diagnosis Date Noted   Left foot drop 12/31/2021   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   Right knee pain 10/25/2019   History of prostate cancer 01/02/2018   Advance directive discussed with patient 09/26/2016   Unilateral primary osteoarthritis, left hip 09/20/2016   Status post left hip replacement 09/20/2016   Routine general medical examination at a health care facility 08/17/2011   History of melanoma    Hyperlipemia 05/20/2008   Benign essential hypertension 05/20/2008   Type 2 diabetes  mellitus with other circulatory complications ([place]) [id number]    PCP: Venia Carbon, MD  REFERRING PROVIDER: Mcarthur Rossetti, MD   REFERRING DIAG: Acquired varus deformity knee, 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   EVAL: Pt reports to PT with pain in bilat hips and R knee. He states he had a L hip replacement 6 years ago and has not had issues until recently. He notes pain in his L hip with bending and flexing to the L side. He has pin point tenderness to his L bursa today with increased pain in weight bearing. Pt with "severe OA in his R hip" and is in need of a replacement per MD. He also has varus deformity of L knee that has been present for years (since he was 72 years of age). He received a cortisone shot in his R hip which  has helped significantly. Pt with continued pain in his R knee. He plays golf a lot and does not have pain with the swinging motion, but notes pain with walking.   PERTINENT HISTORY: DM, HTN, Hx of prostate CA, Hx of skin CA.  PAIN:   P1: R hip P2: R knee P3: L hip  Are you having pain? Yes: NPRS scale: 3/10 Pain location: R hip, L hip and R knee.  Pain description: Tooth ache, intermittent sharp pain in L hip.  Aggravating factors: walking, squatting, bending, twisting and bending a certain way  Relieving factors: Rest   PRECAUTIONS: None  WEIGHT BEARING RESTRICTIONS: No  FALLS:  Has patient fallen in last 6 months? No  LIVING ENVIRONMENT: Lives with: lives with their spouse Lives in: House/apartment Stairs: Yes: Internal: 15  steps; on right going up and External: 4 steps; on right going up Has following equipment at home: Single point cane  OCCUPATION: Retired from [employer]: Independent  PATIENT GOALS: Pt would like to strengthen his bilat LE's, minimize pain and prep for hip surgery. He would like to avoid knee surgery if possible.   OBJECTIVE:   DIAGNOSTIC FINDINGS: Pt reports severe OA in R hip and varus of R knee.   PATIENT SURVEYS:  FOTO 43.81%, 59% in 13 visits.   COGNITION: Overall cognitive status: Within functional limits for tasks assessed     SENSATION: WFL  MUSCLE LENGTH: Hamstrings: Right 80%  deg; Left WFL deg  POSTURE: No Significant postural limitations  PALPATION: L bursa in hip  LOWER EXTREMITY ROM:   ROM Right eval Left eval  Hip flexion [facility]  Hip extension    Hip abduction [facility]*  Hip internal rotation 50% 50%  Hip external rotation [facility]*  Knee flexion [facility]  Knee extension -8 (lacking) 0  Ankle dorsiflexion [facility] WFL  Ankle plantarflexion WFL WFL   (Blank rows = not tested)  LOWER EXTREMITY MMT:  MMT Right eval Left eval  Hip flexion 4- 3+  Hip extension 3 3  Hip abduction  Not tested due to pain.   Knee flexion 3+ 3  Knee extension 4- 4-   (Blank rows = not tested)  FUNCTIONAL TESTS:  5 times sit to stand: 14.22 sec  Timed up and go (TUG): 13.28 sec   09/05/22:  5 times sit to stand: 14.22 sec  Timed up and go (TUG): 13.28 sec  GAIT: Distance walked: 49ft  Assistive device utilized: Single point cane Level of assistance: Complete Independence Comments: Pt walks with an antalgic gait pattern with decreased stride length due to limited knee extension on R knee and a hip shift due to pain on L side.    TODAY'S TREATMENT:        DATE:  09/05/22 TherEx Recumbent bike L3 X 6 min Step ups 6 inch: x 15 each LE Lateral step ups 6 inch step: x 15 leading with each LE Standing hamstring curls: Level 3 band x 20  Standing hip abd:  Level 4 band x 20 Shuttle LE press 100# x20 BLEs Shuttle LE press 50# 2 x12 single leg B Bridges on heels x 15 holding 5 sec Bridges feet on red physio-ball x 15 holding 5 sec Hamstring stretch x 2 each LE holding 30 sec   08/31/22  TherEx  Recumbent bike L3 X 8 min Shuttle LE press 87# x20 BLEs Shuttle LE press 62# 2 x12 single leg B Squats in front of chair 5#  goblet hold x12 Forward step ups 6 inch box in // bars x15 B Lateral step ups 6 inch box in // bars x15 B Standing hip abd with red band X 15 bilat Standing hip ext with red band X 15 bilat Standing hip flexion with red band X 15 bilat Single leg bridge x10 B Walking bridges x10  Sidelying clams x15 B red TB    08/29/22  TherEx  Nustep L5x6 minutes BLEs only  Shuttle LE press 87# x20 BLEs Shuttle LE press 62# 2 x12 single leg B Squats in front of chair 5# goblet hold x12 Forward step ups 6 inch box in // bars x15 B Lateral step ups 6 inch box in // bars x15 B Standing hip abd with red band X 15 bilat Standing hip ext with red band X 15 bilat Standing hip flexion with red band X 15 bilat Single leg bridge x10 B Walking bridges x10  Sidelying clams x15 B red TB  LAQs 4# 2x10 B with 3 second holds     PATIENT EDUCATION:  Education details: Educated pt on anatomy and physiology of current symptoms, FOTO, diagnosis, prognosis, HEP,  and POC. Person educated: Patient Education method: Customer service manager Education comprehension: verbalized understanding and returned demonstration  HOME EXERCISE PROGRAM: Access Code: [email or web address] URL: [email or web address] Date: 08/16/2022 Prepared by: Rudi Heap  Exercises - Seated Hamstring Stretch  - 2 x daily - 7 x weekly - 2 sets - 2 reps - 30 hold - Seated Long Arc Quad  - 2 x daily - 7 x weekly - 2 sets - 10 reps - Seated March  - 2 x daily - 7 x weekly - 2 sets - 10 reps   ASSESSMENT:  CLINICAL IMPRESSION:  Pt tolerating exercises well  for strengthening and stretching. Pt reporting less stiffness at end of session. Continue skilled PT interventions to maximize pt's function  OBJECTIVE IMPAIRMENTS: decreased activity tolerance, difficulty walking, decreased balance, decreased endurance, decreased mobility, decreased ROM, decreased strength, impaired flexibility, impaired LE use, postural dysfunction, and pain.  ACTIVITY LIMITATIONS: bending, lifting, carry, locomotion, cleaning, community activity, driving, and or occupation  PERSONAL FACTORS: DM, HTN, Hx of prostate CA, Hx of skin CA are also affecting patient's functional outcome.  REHAB POTENTIAL: Good  CLINICAL DECISION MAKING: Evolving/moderate complexity  EVALUATION COMPLEXITY: Moderate    GOALS: Short term PT Goals Target date: 08/30/2022  Pt will be I and compliant with HEP. Baseline:  Goal status:MET 08/31/22 Pt will decrease L hip pain by 25% overall Baseline: Goal status: MET 08/31/22  Long term PT goals Target date: 10/11/2022  Pt will improve hip/knee strength to at least 4+/5 MMT to improve functional strength Baseline: Goal status: ongoing 08/31/22 Pt will improve FOTO to at least 59% functional to show improved function Baseline: Goal status: ongoing 08/31/22 Pt will reduce pain L hip pain by overall 50% overall with usual activity Baseline: Goal status: ongoing 08/31/22 Pt will be able to ambulate community distances at least 1000 ft WNL gait pattern without complaints and appropriate AD Baseline: Goal status: ongoing 08/31/22 Pt will be able to bend and squat with 2-3/10 pain in his L hip  Baseline: Goal status: ongoing 08/31/22 Pt will improve TUG by 3.4 seconds or Minimal clinically important difference.  Baseline: Goal status: ongoing 08/31/22 Pt will be prepared for R hip replacement and have a clear understanding of gait pattern/ stair negotiation following surgery.  Baseline: Goal status: ongoing 08/31/22   PLAN: PT FREQUENCY: 1-2  times per week   PT DURATION: 12 weeks  PLANNED INTERVENTIONS (unless contraindicated): aquatic PT, Canalith repositioning, cryotherapy, Electrical stimulation, Iontophoresis with 4 mg/ml dexamethasome, Moist heat, traction, Ultrasound, gait training, Therapeutic exercise, balance training, neuromuscular re-education, patient/family education, prosthetic training, manual techniques, passive ROM, dry needling, taping, vasopnuematic device, vestibular, spinal manipulations, joint manipulations  PLAN FOR NEXT SESSION: Assess HEP/update PRN, continue to progress functional mobility, strengthen anterior and posterior LE chain. Decrease patients pain and help minimize pain with ambulation in L hip.   Kearney Hard, PT, MPT 09/05/22 10:09 AM   09/05/22 10:09 AM

## 2022-09-07 ENCOUNTER — Encounter: Payer: Self-pay | Admitting: Physical Therapy

## 2022-09-07 ENCOUNTER — Ambulatory Visit (INDEPENDENT_AMBULATORY_CARE_PROVIDER_SITE_OTHER): Payer: Medicare Other | Admitting: Physical Therapy

## 2022-09-07 DIAGNOSIS — M6281 Muscle weakness (generalized): Secondary | ICD-10-CM | POA: Diagnosis not present

## 2022-09-07 DIAGNOSIS — M25552 Pain in left hip: Secondary | ICD-10-CM | POA: Diagnosis not present

## 2022-09-07 DIAGNOSIS — M25551 Pain in right hip: Secondary | ICD-10-CM

## 2022-09-07 DIAGNOSIS — M25561 Pain in right knee: Secondary | ICD-10-CM

## 2022-09-07 DIAGNOSIS — R2689 Other abnormalities of gait and mobility: Secondary | ICD-10-CM

## 2022-09-07 NOTE — Therapy (Signed)
OUTPATIENT PHYSICAL THERAPY LOWER EXTREMITY TREATMENT   Patient Name: Ricardo Reese MRN: [id number] DOB:1950/09/30, 72 y.o., male Today's Date: 09/07/2022  END OF SESSION:  PT End of Session - 09/07/22 0810     Visit Number 7    Number of Visits 24    Date for PT Re-Evaluation 11/15/22    Authorization Type Medicare    Progress Note Due on Visit 10    PT Start Time 0802    PT Stop Time 0845    PT Time Calculation (min) 43 min    Activity Tolerance Patient tolerated treatment well    Behavior During Therapy [facility] for tasks assessed/performed                  Past Medical History:  Diagnosis Date   Diabetes mellitus without complication ([place])    History of kidney stones    has had twice   Hyperlipidemia    Hypertension    Impaired fasting glucose    Kidney stone 1983   Melanoma of back [facility]) 2013   Dr Evorn Gong   Pneumonia    walking back in 2002   Prostate cancer ([place]) 2019   Skin cancer 06/14/2011   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   Past Surgical History:  Procedure Laterality Date   COLONOSCOPY     KNEE SURGERY  1969   cartilage removal right knee    melanoma back  09/2010   PROSTATE BIOPSY     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   Procedure: LEFT TOTAL HIP ARTHROPLASTY ANTERIOR APPROACH;  Surgeon: Mcarthur Rossetti, MD;  Location: [facility];  Service: Orthopedics;  Laterality: Left;   Patient Active Problem List   Diagnosis Date Noted   Left foot drop 12/31/2021   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   Right knee pain 10/25/2019   History of prostate cancer 01/02/2018   Advance directive discussed with patient 09/26/2016   Unilateral primary osteoarthritis, left hip 09/20/2016   Status post left hip replacement 09/20/2016   Routine general medical examination at a health care facility 08/17/2011   History of melanoma    Hyperlipemia 05/20/2008   Benign essential hypertension 05/20/2008   Type 2 diabetes  mellitus with other circulatory complications ([place]) A999333    PCP: Venia Carbon, MD  REFERRING PROVIDER: Mcarthur Rossetti, MD   REFERRING DIAG: Acquired varus deformity knee, 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 He states he had a L hip replacement 6 years ago and has not had issues until recently. He notes pain in his L hip with bending and flexing to the L side. He has pin point tenderness to his L bursa today with increased pain in weight bearing. Pt with "severe OA in his R hip" and is in need of a replacement per MD. He also has varus deformity of L knee that has been present for years (since he was 72 years of age). He received a cortisone shot  in his R hip which has helped significantly. Pt with continued pain in his R knee. He plays golf a lot and does not have pain with the swinging motion, but notes pain with walking.   PERTINENT HISTORY: DM, HTN, Hx of prostate CA, Hx of skin CA.  PAIN:   P1: R hip P2: R knee P3: L hip  Are you having pain? Yes: NPRS scale: 2/10 Pain location: R hip, L hip and R knee.  Pain description: Tooth ache, intermittent sharp pain in L hip.  Aggravating factors: walking, squatting, bending, twisting and bending a certain way  Relieving factors: Rest   PRECAUTIONS: None  WEIGHT BEARING RESTRICTIONS: No  FALLS:  Has patient fallen in last 6 months? No  LIVING ENVIRONMENT: Lives with: lives with their spouse Lives in: House/apartment Stairs: Yes:  Internal: 15 steps; on right going up and External: 4 steps; on right going up Has following equipment at home: Single point cane  OCCUPATION: Retired from [employer]: Independent  PATIENT GOALS: Pt would like to strengthen his bilat LE's, minimize pain and prep for hip surgery. He would like to avoid knee surgery if possible.   OBJECTIVE:   DIAGNOSTIC FINDINGS: Pt reports severe OA in R hip and varus of R knee.   PATIENT SURVEYS:  FOTO 43.81%, 59% in 13 visits.   COGNITION: Overall cognitive status: Within functional limits for tasks assessed     SENSATION: WFL  MUSCLE LENGTH: Hamstrings: Right 80%  deg; Left WFL deg  POSTURE: No Significant postural limitations  PALPATION: L bursa in hip  LOWER EXTREMITY ROM:   ROM Right eval Left eval  Hip flexion [facility]  Hip extension    Hip abduction [facility]*  Hip internal rotation 50% 50%  Hip external rotation [facility]*  Knee flexion [facility]  Knee extension -8 (lacking) 0  Ankle dorsiflexion [facility] WFL  Ankle plantarflexion WFL WFL   (Blank rows = not tested)  LOWER EXTREMITY MMT:  MMT Right eval Left eval  Hip flexion 4- 3+  Hip extension 3 3  Hip abduction  Not tested due to pain.   Knee flexion 3+ 3  Knee extension 4- 4-   (Blank rows = not tested)  FUNCTIONAL TESTS:  5 times sit to stand: 14.22 sec  Timed up and go (TUG): 13.28 sec   09/05/22:  5 times sit to stand: 14.22 sec  Timed up and go (TUG): 13.28 sec  GAIT: Distance walked: 39ft  Assistive device utilized: Single point cane Level of assistance: Complete Independence Comments: Pt walks with an antalgic gait pattern with decreased stride length due to limited knee extension on R knee and a hip shift due to pain on L side.    TODAY'S TREATMENT:        DATE:  09/07/22 TherEx Recumbent bike L3 X 6 min Step ups 6 inch: x 15 each LE Lateral step ups 6 inch step: x 15 leading with each LE Standing hip abd: Level 4 band x 20 Shuttle LE press  100# x20 BLEs Shuttle LE press 50# 2 x12 single leg B Bridges feet on red physio-ball x 15 holding 5 sec Hamstring stretch x 2 each LE holding 30 sec  09/05/22 TherEx Recumbent bike L3 X 6 min Step ups 6 inch: x 15 each LE Lateral step ups 6 inch step: x 15 leading with each LE Standing hamstring curls: Level 3 band x 20  Standing hip abd: Level 4 band x  20 Seated knee extension machine 15# DL 2X10 Seated hamstring curl 25# DL 2X12 Shuttle LE press 100# x20 BLEs Shuttle LE press 50# 2 x12 single leg B Bridges on heels x 15 holding 5 sec Bridges feet on red physio-ball x 15 holding 5 sec Hamstring stretch x 2 each LE holding 30 sec    PATIENT EDUCATION:  Education details: Educated pt on anatomy and physiology of current symptoms, FOTO, diagnosis, prognosis, HEP,  and POC. Person educated: Patient Education method: Customer service manager Education comprehension: verbalized understanding and returned demonstration  HOME EXERCISE PROGRAM: Access Code: [email or web address] URL: [email or web address] Date: 08/16/2022 Prepared by: Rudi Heap  Exercises - Seated Hamstring Stretch  - 2 x daily - 7 x weekly - 2 sets - 2 reps - 30 hold - Seated Long Arc Quad  - 2 x daily - 7 x weekly - 2 sets - 10 reps - Seated March  - 2 x daily - 7 x weekly - 2 sets - 10 reps   ASSESSMENT:  CLINICAL IMPRESSION:  We progressed his strengthening program for hip/knee and he had good tolerance to this without complaints. He has been able to golf more with less overall pain. Continue skilled PT interventions to maximize pt's function  OBJECTIVE IMPAIRMENTS: decreased activity tolerance, difficulty walking, decreased balance, decreased endurance, decreased mobility, decreased ROM, decreased strength, impaired flexibility, impaired LE use, postural dysfunction, and pain.  ACTIVITY LIMITATIONS: bending, lifting, carry, locomotion, cleaning, community activity, driving, and or  occupation  PERSONAL FACTORS: DM, HTN, Hx of prostate CA, Hx of skin CA are also affecting patient's functional outcome.  REHAB POTENTIAL: Good  CLINICAL DECISION MAKING: Evolving/moderate complexity  EVALUATION COMPLEXITY: Moderate    GOALS: Short term PT Goals Target date: 08/30/2022  Pt will be I and compliant with HEP. Baseline:  Goal status:MET 08/31/22 Pt will decrease L hip pain by 25% overall Baseline: Goal status: MET 08/31/22  Long term PT goals Target date: 10/11/2022  Pt will improve hip/knee strength to at least 4+/5 MMT to improve functional strength Baseline: Goal status: ongoing 08/31/22 Pt will improve FOTO to at least 59% functional to show improved function Baseline: Goal status: ongoing 08/31/22 Pt will reduce pain L hip pain by overall 50% overall with usual activity Baseline: Goal status: ongoing 08/31/22 Pt will be able to ambulate community distances at least 1000 ft WNL gait pattern without complaints and appropriate AD Baseline: Goal status: ongoing 08/31/22 Pt will be able to bend and squat with 2-3/10 pain in his L hip  Baseline: Goal status: ongoing 08/31/22 Pt will improve TUG by 3.4 seconds or Minimal clinically important difference.  Baseline: Goal status: ongoing 08/31/22 Pt will be prepared for R hip replacement and have a clear understanding of gait pattern/ stair negotiation following surgery.  Baseline: Goal status: ongoing 08/31/22   PLAN: PT FREQUENCY: 1-2 times per week   PT DURATION: 12 weeks  PLANNED INTERVENTIONS (unless contraindicated): aquatic PT, Canalith repositioning, cryotherapy, Electrical stimulation, Iontophoresis with 4 mg/ml dexamethasome, Moist heat, traction, Ultrasound, gait training, Therapeutic exercise, balance training, neuromuscular re-education, patient/family education, prosthetic training, manual techniques, passive ROM, dry needling, taping, vasopnuematic device, vestibular, spinal manipulations, joint  manipulations  PLAN FOR NEXT SESSION: Assess HEP/update PRN, continue to progress functional mobility, strengthen anterior and posterior LE chain. Decrease patients pain and help minimize pain with ambulation in L hip.   Elsie Ra, PT, DPT 09/07/22 8:11 AM

## 2022-09-12 ENCOUNTER — Encounter: Payer: Medicare Other | Admitting: Physical Therapy

## 2022-09-14 ENCOUNTER — Ambulatory Visit (INDEPENDENT_AMBULATORY_CARE_PROVIDER_SITE_OTHER): Payer: Medicare Other | Admitting: Orthopaedic Surgery

## 2022-09-14 ENCOUNTER — Encounter: Payer: Medicare Other | Admitting: Physical Therapy

## 2022-09-14 ENCOUNTER — Encounter: Payer: Self-pay | Admitting: Orthopaedic Surgery

## 2022-09-14 ENCOUNTER — Ambulatory Visit (INDEPENDENT_AMBULATORY_CARE_PROVIDER_SITE_OTHER): Payer: Medicare Other | Admitting: Physical Therapy

## 2022-09-14 DIAGNOSIS — M25552 Pain in left hip: Secondary | ICD-10-CM

## 2022-09-14 DIAGNOSIS — M25561 Pain in right knee: Secondary | ICD-10-CM

## 2022-09-14 DIAGNOSIS — M21161 Varus deformity, not elsewhere classified, right knee: Secondary | ICD-10-CM | POA: Diagnosis not present

## 2022-09-14 DIAGNOSIS — R2689 Other abnormalities of gait and mobility: Secondary | ICD-10-CM | POA: Diagnosis not present

## 2022-09-14 DIAGNOSIS — M25551 Pain in right hip: Secondary | ICD-10-CM | POA: Diagnosis not present

## 2022-09-14 DIAGNOSIS — M6281 Muscle weakness (generalized): Secondary | ICD-10-CM | POA: Diagnosis not present

## 2022-09-14 MED ORDER — METHYLPREDNISOLONE ACETATE 40 MG/ML IJ SUSP
40.0000 mg | INTRAMUSCULAR | Status: AC | PRN
  Administered 2022-09-14: 40 mg via INTRA_ARTICULAR

## 2022-09-14 MED ORDER — LIDOCAINE HCL 1 % IJ SOLN
3.0000 mL | INTRAMUSCULAR | Status: AC | PRN
  Administered 2022-09-14: 3 mL

## 2022-09-14 NOTE — Therapy (Signed)
OUTPATIENT PHYSICAL THERAPY LOWER EXTREMITY TREATMENT   Patient Name: Ricardo Reese MRN: [id number] DOB:Sep 11, 1950, 72 y.o., male Today's Date: 09/14/2022  END OF SESSION:  PT End of Session - 09/14/22 1516     Visit Number 8    Number of Visits 24    Date for PT Re-Evaluation 11/15/22    Authorization Type Medicare    Progress Note Due on Visit 10    PT Start Time 1500    PT Stop Time 1540    PT Time Calculation (min) 40 min    Activity Tolerance Patient tolerated treatment well    Behavior During Therapy [facility] for tasks assessed/performed                  Past Medical History:  Diagnosis Date   Diabetes mellitus without complication    History of kidney stones    has had twice   Hyperlipidemia    Hypertension    Impaired fasting glucose    Kidney stone 1983   Melanoma of back 2013   Dr Evorn Gong   Pneumonia    walking back in 2002   Prostate cancer 2019   Skin cancer 06/14/2011   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   Past Surgical History:  Procedure Laterality Date   COLONOSCOPY     KNEE SURGERY  1969   cartilage removal right knee    melanoma back  09/2010   PROSTATE BIOPSY     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   Procedure: LEFT TOTAL HIP ARTHROPLASTY ANTERIOR APPROACH;  Surgeon: Mcarthur Rossetti, MD;  Location: [place];  Service: Orthopedics;  Laterality: Left;   Patient Active Problem List   Diagnosis Date Noted   Left foot drop 12/31/2021   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   Right knee pain 10/25/2019   History of prostate cancer 01/02/2018   Advance directive discussed with patient 09/26/2016   Unilateral primary osteoarthritis, left hip 09/20/2016   Status post left hip replacement 09/20/2016   Routine general medical examination at a health care facility 08/17/2011   History of melanoma    Hyperlipemia 05/20/2008   Benign essential hypertension 05/20/2008   Type 2 diabetes mellitus with other  circulatory complications [id number]    PCP: Venia Carbon, MD  REFERRING PROVIDER: Mcarthur Rossetti, MD   REFERRING DIAG: Acquired varus deformity knee, 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 He states he had a L hip replacement 6 years ago and has not had issues until recently. He notes pain in his L hip with bending and flexing to the L side. He has pin point tenderness to his L bursa today with increased pain in weight bearing. Pt with "severe OA in his R hip" and is in need of a replacement per MD. He also has varus deformity of L knee that has been present for years (since he was 72 years of age). He received a cortisone shot in his R hip which  has helped significantly. Pt with continued pain in his R knee. He plays golf a lot and does not have pain with the swinging motion, but notes pain with walking.   PERTINENT HISTORY: DM, HTN, Hx of prostate CA, Hx of skin CA.  PAIN:   P1: R hip P2: R knee P3: L hip  Are you having pain? Yes: NPRS scale: 2/10 Pain location: R hip, L hip and R knee.  Pain description: Tooth ache, intermittent sharp pain in L hip.  Aggravating factors: walking, squatting, bending, twisting and bending a certain way  Relieving factors: Rest   PRECAUTIONS: None  WEIGHT BEARING RESTRICTIONS: No  FALLS:  Has patient fallen in last 6 months? No  LIVING ENVIRONMENT: Lives with: lives with their spouse Lives in: House/apartment Stairs: Yes: Internal: 15 steps; on right  going up and External: 4 steps; on right going up Has following equipment at home: Single point cane  OCCUPATION: Retired from [employer]: Independent  PATIENT GOALS: Pt would like to strengthen his bilat LE's, minimize pain and prep for hip surgery. He would like to avoid knee surgery if possible.   OBJECTIVE:   DIAGNOSTIC FINDINGS: Pt reports severe OA in R hip and varus of R knee.   PATIENT SURVEYS:  FOTO 43.81%, 59% in 13 visits.   COGNITION: Overall cognitive status: Within functional limits for tasks assessed     SENSATION: WFL  MUSCLE LENGTH: Hamstrings: Right 80%  deg; Left WFL deg  POSTURE: No Significant postural limitations  PALPATION: L bursa in hip  LOWER EXTREMITY ROM:   ROM Right eval Left eval  Hip flexion [facility]  Hip extension    Hip abduction [facility]*  Hip internal rotation 50% 50%  Hip external rotation [facility]*  Knee flexion [facility]  Knee extension -8 (lacking) 0  Ankle dorsiflexion [facility] WFL  Ankle plantarflexion WFL WFL   (Blank rows = not tested)  LOWER EXTREMITY MMT:  MMT Right eval Left eval  Hip flexion 4- 3+  Hip extension 3 3  Hip abduction  Not tested due to pain.   Knee flexion 3+ 3  Knee extension 4- 4-   (Blank rows = not tested)  FUNCTIONAL TESTS:  5 times sit to stand: 14.22 sec  Timed up and go (TUG): 13.28 sec   09/05/22:  5 times sit to stand: 14.22 sec  Timed up and go (TUG): 13.28 sec  GAIT: Distance walked: 69ft  Assistive device utilized: Single point cane Level of assistance: Complete Independence Comments: Pt walks with an antalgic gait pattern with decreased stride length due to limited knee extension on R knee and a hip shift due to pain on L side.    TODAY'S TREATMENT:        DATE:  09/14/22 TherEx Recumbent bike L3 X 8 min Step ups 6 inch: x 10 each LE Lateral step ups 6 inch step: x 10 leading with each LE Standing hip abd, marches and extensions: Level 4 band x 15 each Shuttle LE press  100# x30 BLEs Shuttle LE press 50# 2 x15 single leg B Bridges feet on red physio-ball x 15 holding 5 sec Hamstring stretch x 2 each LE holding 30 sec  09/07/22 TherEx Recumbent bike L3 X 6 min Step ups 6 inch: x 15 each LE Lateral step ups 6 inch step: x 15 leading with each LE Standing hip abd: Level 4 band x 20 Shuttle LE press 100# x20 BLEs Shuttle LE press  50# 2 x12 single leg B Bridges feet on red physio-ball x 15 holding 5 sec Hamstring stretch x 2 each LE holding 30 sec  09/05/22 TherEx Recumbent bike L3 X 6 min Step ups 6 inch: x 15 each LE Lateral step ups 6 inch step: x 15 leading with each LE Standing hamstring curls: Level 3 band x 20  Standing hip abd: Level 4 band x 20 Seated knee extension machine 15# DL 2X10 Seated hamstring curl 25# DL 2X12 Shuttle LE press 100# x20 BLEs Shuttle LE press 50# 2 x12 single leg B Bridges on heels x 15 holding 5 sec Bridges feet on red physio-ball x 15 holding 5 sec Hamstring stretch x 2 each LE holding 30 sec    PATIENT EDUCATION:  Education details: Educated pt on anatomy and physiology of current symptoms, FOTO, diagnosis, prognosis, HEP,  and POC. Person educated: Patient Education method: Customer service manager Education comprehension: verbalized understanding and returned demonstration  HOME EXERCISE PROGRAM: Access Code: [email or web address] URL: [email or web address] Date: 08/16/2022 Prepared by: Rudi Heap  Exercises - Seated Hamstring Stretch  - 2 x daily - 7 x weekly - 2 sets - 2 reps - 30 hold - Seated Long Arc Quad  - 2 x daily - 7 x weekly - 2 sets - 10 reps - Seated March  - 2 x daily - 7 x weekly - 2 sets - 10 reps   ASSESSMENT:  CLINICAL IMPRESSION:  He has one more visit scheduled and we will reassess next week. He has made progress but I would recommend he continue to work on strengthening with PT.  OBJECTIVE IMPAIRMENTS: decreased activity tolerance, difficulty walking, decreased  balance, decreased endurance, decreased mobility, decreased ROM, decreased strength, impaired flexibility, impaired LE use, postural dysfunction, and pain.  ACTIVITY LIMITATIONS: bending, lifting, carry, locomotion, cleaning, community activity, driving, and or occupation  PERSONAL FACTORS: DM, HTN, Hx of prostate CA, Hx of skin CA are also affecting patient's functional outcome.  REHAB POTENTIAL: Good  CLINICAL DECISION MAKING: Evolving/moderate complexity  EVALUATION COMPLEXITY: Moderate    GOALS: Short term PT Goals Target date: 08/30/2022  Pt will be I and compliant with HEP. Baseline:  Goal status:MET 08/31/22 Pt will decrease L hip pain by 25% overall Baseline: Goal status: MET 08/31/22  Long term PT goals Target date: 10/11/2022  Pt will improve hip/knee strength to at least 4+/5 MMT to improve functional strength Baseline: Goal status: ongoing 08/31/22 Pt will improve FOTO to at least 59% functional to show improved function Baseline: Goal status: ongoing 08/31/22 Pt will reduce pain L hip pain by overall 50% overall with usual activity Baseline: Goal status: ongoing 08/31/22 Pt will be able to ambulate community distances at least 1000 ft WNL gait pattern without complaints and appropriate AD Baseline: Goal status: ongoing 08/31/22 Pt will be able to bend and squat with 2-3/10 pain in his L hip  Baseline: Goal status: ongoing 08/31/22 Pt will improve TUG by 3.4 seconds or Minimal clinically important difference.  Baseline: Goal status: ongoing 08/31/22 Pt will be prepared for R hip replacement and have a clear understanding of gait pattern/ stair negotiation following surgery.  Baseline: Goal status: ongoing 08/31/22   PLAN: PT FREQUENCY: 1-2 times per week   PT DURATION: 12 weeks  PLANNED INTERVENTIONS (unless contraindicated): aquatic PT, Canalith repositioning, cryotherapy, Electrical stimulation, Iontophoresis with 4 mg/ml dexamethasome, Moist heat, traction,  Ultrasound, gait training, Therapeutic exercise, balance training, neuromuscular re-education, patient/family education, prosthetic training, manual techniques, passive ROM, dry  needling, taping, vasopnuematic device, vestibular, spinal manipulations, joint manipulations  PLAN FOR NEXT SESSION: reassessment   Elsie Ra, PT, DPT 09/14/22 3:17 PM

## 2022-09-14 NOTE — Progress Notes (Signed)
Office Visit Note   Patient: Ricardo Reese           Date of Birth: 1951/06/09           MRN: [id number] Visit Date: 09/14/2022              Requested by: Venia Carbon, MD [place],  [place] [address] PCP: Venia Carbon, MD   Assessment & Plan: Visit Diagnoses:  1. Acquired varus deformity knee, right   2. Pain of right hip     Plan:  He will continue therapy again to get home exercise program for his hip.  He was agreeable to cortisone injection right knee today.  In regards to any surgical intervention he will let us know if pain becomes such that he wants to undergo right total hip arthroplasty or right total knee arthroplasty.  Discussed reasons that he may consider surgical intervention.  Questions were encouraged and answered by Dr. Ninfa Linden and myself  Follow-Up Instructions: Return if symptoms worsen or fail to improve.   Orders:  No orders of the defined types were placed in this encounter.  No orders of the defined types were placed in this encounter.     Procedures: Large Joint Inj: R knee on 09/14/2022 8:46 AM Indications: pain Details: 22 G 1.5 in needle, anterolateral approach  Arthrogram: No  Medications: 3 mL lidocaine 1 %; 40 mg methylPREDNISolone acetate 40 MG/ML Outcome: tolerated well, no immediate complications Procedure, treatment alternatives, risks and benefits explained, specific risks discussed. Consent was given by the patient. Immediately prior to procedure a time out was called to verify the correct patient, procedure, equipment, support staff and site/side marked as required. Patient was prepped and draped in the usual sterile fashion.       Clinical Data: No additional findings.   Subjective: Chief Complaint  Patient presents with   Right Knee - Follow-up    HPI Mr. Godina returns today for follow-up of his right hip and right knee pain.  He had injection right hip on 07/18/2038 is also been to therapy for  his hip.  He feels these are very beneficial he is really having no pain in the hip at this point in time.  He continues to have pain in the right knee he ranks 7-8 out of 10 pain at worst.  Mechanical symptoms positive for greater painful popping.  Notes no other mechanical symptoms.  Notes any swelling in the.  Does note a clicking sensation at times and knee.  And he feels that it does affect his golf game.  Review of Systems See HPI  Objective: Vital Signs: There were no vitals taken for this visit.  Physical Exam Constitutional:      Appearance: He is not ill-appearing or diaphoretic.  Pulmonary:     Effort: Pulmonary effort is normal.  Neurological:     Mental Status: He is alert and oriented to person, place, and time.  Psychiatric:        Mood and Affect: Mood normal.     Ortho Exam Right hip excellent range of motion without pain. Right knee full extension full flexion.  No obvious varus deformity.  No instability valgus varus stressing.  No abnormal warmth erythema or effusion right knee. Specialty Comments:  No specialty comments available.  Imaging: No results found.   PMFS History: Patient Active Problem List   Diagnosis Date Noted   Left foot drop 12/31/2021   Aortic valve sclerosis  04/28/2020   Right knee pain 10/25/2019   History of prostate cancer 01/02/2018   Advance directive discussed with patient 09/26/2016   Unilateral primary osteoarthritis, left hip 09/20/2016   Status post left hip replacement 09/20/2016   Routine general medical examination at a health care facility 08/17/2011   History of melanoma    Hyperlipemia 05/20/2008   Benign essential hypertension 05/20/2008   Type 2 diabetes mellitus with other circulatory complications [id number]   Past Medical History:  Diagnosis Date   Diabetes mellitus without complication    History of kidney stones    has had twice   Hyperlipidemia    Hypertension    Impaired fasting glucose    Kidney  stone 1983   Melanoma of back 2013   Dr Evorn Gong   Pneumonia    walking back in 2002   Prostate cancer 2019   Skin cancer 06/14/2011   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    Family History  Problem Relation Age of Onset   Heart attack Mother    Stroke Father    Lung cancer Father    Prostate cancer Brother    Heart disease Brother    Diabetes Neg Hx    Colon cancer Neg Hx    Stomach cancer Neg Hx    Esophageal cancer Neg Hx    Rectal cancer Neg Hx    Colon polyps Neg Hx     Past Surgical History:  Procedure Laterality Date   COLONOSCOPY     KNEE SURGERY  1969   cartilage removal right knee    melanoma back  09/2010   PROSTATE BIOPSY     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   Procedure: LEFT TOTAL HIP ARTHROPLASTY ANTERIOR APPROACH;  Surgeon: Mcarthur Rossetti, MD;  Location: [facility];  Service: Orthopedics;  Laterality: Left;   Social History   Occupational History   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  Tobacco Use   Smoking status: Never    Passive exposure: Past   Smokeless tobacco: Never  Vaping Use   Vaping Use: Never used  Substance and Sexual Activity   Alcohol use: Yes    Alcohol/week: 3.0 - 4.0 standard drinks of alcohol    Types: 3 - 4 Cans of beer per week    Comment: occasional beer   Drug use: No   Sexual activity: Yes

## 2022-09-16 DIAGNOSIS — Z8546 Personal history of malignant neoplasm of prostate: Secondary | ICD-10-CM | POA: Diagnosis not present

## 2022-09-19 ENCOUNTER — Ambulatory Visit (INDEPENDENT_AMBULATORY_CARE_PROVIDER_SITE_OTHER): Payer: Medicare Other | Admitting: Physical Therapy

## 2022-09-19 ENCOUNTER — Encounter: Payer: Self-pay | Admitting: Physical Therapy

## 2022-09-19 DIAGNOSIS — M25551 Pain in right hip: Secondary | ICD-10-CM | POA: Diagnosis not present

## 2022-09-19 DIAGNOSIS — M25552 Pain in left hip: Secondary | ICD-10-CM

## 2022-09-19 DIAGNOSIS — M25561 Pain in right knee: Secondary | ICD-10-CM

## 2022-09-19 DIAGNOSIS — M6281 Muscle weakness (generalized): Secondary | ICD-10-CM

## 2022-09-19 DIAGNOSIS — R2689 Other abnormalities of gait and mobility: Secondary | ICD-10-CM

## 2022-09-19 NOTE — Therapy (Signed)
OUTPATIENT PHYSICAL THERAPY LOWER EXTREMITY TREATMENT Progress Note reporting period 08/16/22 to 09/19/22  See below for objective and subjective measurements relating to patients progress with PT.   Patient Name: Ricardo Reese MRN: [id number] DOB:[birth year], 72 y.o., male Today's Date: 09/19/2022  END OF SESSION:  PT End of Session - 09/19/22 0812     Visit Number 9    Number of Visits 24    Date for PT Re-Evaluation 11/15/22    Authorization Type Medicare    Progress Note Due on Visit 19    PT Start Time 0803    PT Stop Time 0848    PT Time Calculation (min) 45 min    Activity Tolerance Patient tolerated treatment well    Behavior During Therapy [facility] for tasks assessed/performed                  Past Medical History:  Diagnosis Date   Diabetes mellitus without complication    History of kidney stones    has had twice   Hyperlipidemia    Hypertension    Impaired fasting glucose    Kidney stone 1983   Melanoma of back 2013   Dr Adolphus Birchwood   Pneumonia    walking back in 2002   Prostate cancer 2019   Skin cancer 06/14/2011   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   Past Surgical History:  Procedure Laterality Date   COLONOSCOPY     KNEE SURGERY  1969   cartilage removal right knee    melanoma back  09/2010   PROSTATE BIOPSY     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   Procedure: LEFT TOTAL HIP ARTHROPLASTY ANTERIOR APPROACH;  Surgeon: Kathryne Hitch, MD;  Location: [facility];  Service: Orthopedics;  Laterality: Left;   Patient Active Problem List   Diagnosis Date Noted   Left foot drop 12/31/2021   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   Right knee pain 10/25/2019   History of prostate cancer 01/02/2018   Advance directive discussed with patient 09/26/2016   Unilateral primary osteoarthritis, left hip 09/20/2016   Status post left hip replacement 09/20/2016   Routine general medical examination at a health care facility  08/17/2011   History of melanoma    Hyperlipemia 05/20/2008   Benign essential hypertension 05/20/2008   Type 2 diabetes mellitus with other circulatory complications 05/20/2008    PCP: Karie Schwalbe, MD  REFERRING PROVIDER: Kathryne Hitch, MD   REFERRING DIAG: Acquired varus deformity knee, right [M21.161], Trochanteric bursitis, left hip [M70.62], Pain of right hip [M25.551]   THERAPY DIAG:  Muscle weakness (generalized)  Other abnormalities of gait and mobility  Pain in left hip  Pain in right hip  Right knee pain, unspecified chronicity  Rationale for Evaluation and Treatment: Rehabilitation  ONSET DATE: Ongoing   SUBJECTIVE:   SUBJECTIVE STATEMENT: Pt arriving today and he reporting he can tell PT is helping and would like to continue  PERTINENT HISTORY: DM, HTN, Hx of prostate CA, Hx of skin CA.  PAIN:   P1: R hip P2: R knee P3: L hip  Are you having pain? Yes: NPRS scale: 2/10 Pain location: R hip, L hip and R knee.  Pain description: Tooth ache, intermittent sharp pain in L hip.  Aggravating factors: walking, squatting, bending, twisting and bending a certain way  Relieving factors: Rest   PRECAUTIONS: None  WEIGHT BEARING RESTRICTIONS: No  FALLS:  Has patient fallen in last  6 months? 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   PLOF: Independent  PATIENT GOALS: Pt would like to strengthen his bilat LE's, minimize pain and prep for hip surgery. He would like to avoid knee surgery if possible.   OBJECTIVE:   DIAGNOSTIC FINDINGS: Pt reports severe OA in R hip and varus of R knee.   PATIENT SURVEYS:  FOTO 43.81%, 59% in 13 visits.   COGNITION: Overall cognitive status: Within functional limits for tasks  assessed     SENSATION: WFL  MUSCLE LENGTH: Hamstrings: Right 80%  deg; Left WFL deg  POSTURE: No Significant postural limitations  PALPATION: L bursa in hip  LOWER EXTREMITY ROM:   ROM Right eval Left eval Right/Left 09/19/22  Hip flexion [facility]   Hip extension     Hip abduction [facility]   Hip internal rotation 50% 50%   Hip external rotation [facility]   Knee flexion [facility]   Knee extension -8 (lacking) 0 10/10  Ankle dorsiflexion [facility]   Ankle plantarflexion WFL WFL    (Blank rows = not tested)  LOWER EXTREMITY MMT:  MMT Right eval Left eval Right/Left 09/19/22  Hip flexion 4- 3+ 4+/4+  Hip extension 3 3 4+/4+  Hip abduction  Not tested due to pain.  4+/4+  Knee flexion 3+ 3 4+/4+  Knee extension 4- 4- 5/5   (Blank rows = not tested)  FUNCTIONAL TESTS:  Eval 5 times sit to stand: 14.22 sec  Timed up and go (TUG): 13.28 sec   09/05/22:  5 times sit to stand: 14.22 sec  Timed up and go (TUG): 13.28 sec   09/19/22 5 times sit to stand: 10.3 seconds GAIT: Distance walked: 2950ft  Assistive device utilized: Single point cane Level of assistance: Complete Independence Comments: Pt walks with an antalgic gait pattern with decreased stride length due to limited knee extension on R knee and a hip shift due to pain on L side.    TODAY'S TREATMENT:        DATE:  09/19/22 TherEx Recumbent bike L3 X 10 min Updated measurments and goals for progress note Step ups 6 inch: x 15 each LE Lateral step ups 6 inch step: x 15 leading with each LE Standing hip abd, marches and extensions: Level 4 band x 15 each Shuttle LE press 106# x30 BLEs Shuttle LE press 56# 2 x15 single leg B Hamstring stretch x 2 each LE holding 30 sec Standing hip flexor stretch 30 sec X 2   09/14/22 TherEx Recumbent bike L3 X 8 min Step ups 6 inch: x 10 each LE Lateral step ups 6 inch step: x 10 leading with each LE Standing hip abd, marches and extensions: Level 4 band x 15 each Shuttle  LE press 100# x30 BLEs Shuttle LE press 50# 2 x15 single leg B Bridges feet on red physio-ball x 15 holding 5 sec Hamstring stretch x 2 each LE holding 30 sec    PATIENT EDUCATION:  Education details: Educated pt on anatomy and physiology of current symptoms, FOTO, diagnosis, prognosis, HEP,  and POC. Person educated: Patient Education method: Medical illustratorxplanation and Demonstration Education comprehension: verbalized understanding and returned demonstration  HOME EXERCISE PROGRAM: Access Code: [email or web address] URL: [email or web address] Date: 08/16/2022 Prepared by: Royal HawthornSimone Yuds  Exercises -  Seated Hamstring Stretch  - 2 x daily - 7 x weekly - 2 sets - 2 reps - 30 hold - Seated Long Arc Quad  - 2 x daily - 7 x weekly - 2 sets - 10 reps - Seated March  - 2 x daily - 7 x weekly - 2 sets - 10 reps   ASSESSMENT:  CLINICAL IMPRESSION:  Progress note reflects he has made good overall progress with PT in regards to his strength and ROM for LE. He does still have some pain at times and has not yet met all of his PT goals so PT recommending to continue with current plan of care.  OBJECTIVE IMPAIRMENTS: decreased activity tolerance, difficulty walking, decreased balance, decreased endurance, decreased mobility, decreased ROM, decreased strength, impaired flexibility, impaired LE use, postural dysfunction, and pain.  ACTIVITY LIMITATIONS: bending, lifting, carry, locomotion, cleaning, community activity, driving, and or occupation  PERSONAL FACTORS: DM, HTN, Hx of prostate CA, Hx of skin CA are also affecting patient's functional outcome.  REHAB POTENTIAL: Good  CLINICAL DECISION MAKING: Evolving/moderate complexity  EVALUATION COMPLEXITY: Moderate    GOALS: Short term PT Goals Target date: 08/30/2022  Pt will be I and compliant with HEP. Baseline:  Goal status:MET 08/31/22 Pt will decrease L hip pain by 25% overall Baseline: Goal status: MET 08/31/22  Long term PT goals Target  date: 10/11/2022  Pt will improve hip/knee strength to at least 4+/5 MMT to improve functional strength Baseline: Goal status: MET 09/19/22 Pt will improve FOTO to at least 59% functional to show improved function Baseline: Goal status: ongoing 08/31/22 Pt will reduce pain L hip pain by overall 50% overall with usual activity Baseline: Goal status: MET 09/19/22 Pt will be able to ambulate community distances at least 1000 ft WNL gait pattern without complaints and appropriate AD Baseline: Goal status: ongoing 4/180/24 Pt will be able to bend and squat with 2-3/10 pain in his L hip  Baseline: Goal status: ongoing 09/19/22 Pt will improve TUG by 3.4 seconds or Minimal clinically important difference.  Baseline: Goal status: MET 09/19/22 Pt will be prepared for R hip replacement and have a clear understanding of gait pattern/ stair negotiation following surgery.  Baseline: Goal status: ongoing 09/19/22   PLAN: PT FREQUENCY: 1-2 times per week   PT DURATION: 12 weeks  PLANNED INTERVENTIONS (unless contraindicated): aquatic PT, Canalith repositioning, cryotherapy, Electrical stimulation, Iontophoresis with 4 mg/ml dexamethasome, Moist heat, traction, Ultrasound, gait training, Therapeutic exercise, balance training, neuromuscular re-education, patient/family education, prosthetic training, manual techniques, passive ROM, dry needling, taping, vasopnuematic device, vestibular, spinal manipulations, joint manipulations  PLAN FOR NEXT SESSION: continue with hip/knee strength and ROM program. Add TRX squats  Ivery Quale, PT, DPT 09/19/22 8:47 AM

## 2022-09-23 DIAGNOSIS — Z8546 Personal history of malignant neoplasm of prostate: Secondary | ICD-10-CM | POA: Diagnosis not present

## 2022-09-23 DIAGNOSIS — E349 Endocrine disorder, unspecified: Secondary | ICD-10-CM | POA: Diagnosis not present

## 2022-09-23 DIAGNOSIS — N5201 Erectile dysfunction due to arterial insufficiency: Secondary | ICD-10-CM | POA: Diagnosis not present

## 2022-09-26 ENCOUNTER — Encounter: Payer: Medicare Other | Admitting: Physical Therapy

## 2022-09-26 DIAGNOSIS — E119 Type 2 diabetes mellitus without complications: Secondary | ICD-10-CM | POA: Diagnosis not present

## 2022-09-26 LAB — HM DIABETES EYE EXAM

## 2022-10-03 ENCOUNTER — Ambulatory Visit (INDEPENDENT_AMBULATORY_CARE_PROVIDER_SITE_OTHER): Payer: Medicare Other | Admitting: Physical Therapy

## 2022-10-03 ENCOUNTER — Encounter: Payer: Self-pay | Admitting: Physical Therapy

## 2022-10-03 DIAGNOSIS — M25552 Pain in left hip: Secondary | ICD-10-CM | POA: Diagnosis not present

## 2022-10-03 DIAGNOSIS — R2689 Other abnormalities of gait and mobility: Secondary | ICD-10-CM | POA: Diagnosis not present

## 2022-10-03 DIAGNOSIS — M6281 Muscle weakness (generalized): Secondary | ICD-10-CM

## 2022-10-03 DIAGNOSIS — M25561 Pain in right knee: Secondary | ICD-10-CM

## 2022-10-03 DIAGNOSIS — M25551 Pain in right hip: Secondary | ICD-10-CM

## 2022-10-03 NOTE — Therapy (Signed)
OUTPATIENT PHYSICAL THERAPY LOWER EXTREMITY TREATMENT    Patient Name: Ricardo Reese MRN: [id number] DOB:November 14, 1950, 72 y.o., male Today's Date: 10/03/2022  END OF SESSION:  PT End of Session - 10/03/22 0809     Visit Number 10    Number of Visits 24    Date for PT Re-Evaluation 11/15/22    Authorization Type Medicare    Progress Note Due on Visit 19    PT Start Time 0800    PT Stop Time 0845    PT Time Calculation (min) 45 min    Activity Tolerance Patient tolerated treatment well    Behavior During Therapy [facility] for tasks assessed/performed                  Past Medical History:  Diagnosis Date   Diabetes mellitus without complication    History of kidney stones    has had twice   Hyperlipidemia    Hypertension    Impaired fasting glucose    Kidney stone 1983   Melanoma of back 2013   Dr Adolphus Birchwood   Pneumonia    walking back in 2002   Prostate cancer 2019   Skin cancer 06/14/2011   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   Past Surgical History:  Procedure Laterality Date   COLONOSCOPY     KNEE SURGERY  1969   cartilage removal right knee    melanoma back  09/2010   PROSTATE BIOPSY     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   Procedure: LEFT TOTAL HIP ARTHROPLASTY ANTERIOR APPROACH;  Surgeon: Kathryne Hitch, MD;  Location: [facility] OR;  Service: Orthopedics;  Laterality: Left;   Patient Active Problem List   Diagnosis Date Noted   Left foot drop 12/31/2021   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   Right knee pain 10/25/2019   History of prostate cancer 01/02/2018   Advance directive discussed with patient 09/26/2016   Unilateral primary osteoarthritis, left hip 09/20/2016   Status post left hip replacement 09/20/2016   Routine general medical examination at a health care facility 08/17/2011   History of melanoma    Hyperlipemia 05/20/2008   Benign essential hypertension 05/20/2008   Type 2 diabetes mellitus with  other circulatory complications 05/20/2008    PCP: Karie Schwalbe, MD  REFERRING PROVIDER: Kathryne Hitch, MD   REFERRING DIAG: Acquired varus deformity knee, right [M21.161], Trochanteric bursitis, left hip [M70.62], Pain of right hip [M25.551]   THERAPY DIAG:  Muscle weakness (generalized)  Other abnormalities of gait and mobility  Pain in left hip  Pain in right hip  Right knee pain, unspecified chronicity  Rationale for Evaluation and Treatment: Rehabilitation  ONSET DATE: Ongoing   SUBJECTIVE:   SUBJECTIVE STATEMENT: Pt states he twisted his knee playing golf but its not bad and his hip/knee does not hurt much today.  PERTINENT HISTORY: DM, HTN, Hx of prostate CA, Hx of skin CA.  PAIN:   P1: R hip P2: R knee P3: L hip  Are you having pain? Yes: NPRS scale: 2/10 Pain location: R hip, L hip and R knee.  Pain description: Tooth ache, intermittent sharp pain in L hip.  Aggravating factors: walking, squatting, bending, twisting and bending a certain way  Relieving factors: Rest   PRECAUTIONS: None  WEIGHT BEARING RESTRICTIONS: No  FALLS:  Has patient fallen in last 6 months? 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   PLOF: Independent  PATIENT GOALS: Pt would like to strengthen his bilat LE's, minimize pain and prep for hip surgery. He would like to avoid knee surgery if possible.   OBJECTIVE:   DIAGNOSTIC FINDINGS: Pt reports severe OA in R hip and varus of R knee.   PATIENT SURVEYS:  FOTO 43.81%, 59% in 13 visits.   COGNITION: Overall cognitive status: Within functional limits for tasks assessed     SENSATION: WFL  MUSCLE LENGTH: Hamstrings: Right 80%  deg; Left WFL deg  POSTURE: No Significant postural  limitations  PALPATION: L bursa in hip  LOWER EXTREMITY ROM:   ROM Right eval Left eval Right/Left 09/19/22  Hip flexion [facility]   Hip extension     Hip abduction [facility]*   Hip internal rotation 50% 50%   Hip external rotation [facility]*   Knee flexion [facility]   Knee extension -8 (lacking) 0 10/10  Ankle dorsiflexion [facility]   Ankle plantarflexion WFL WFL    (Blank rows = not tested)  LOWER EXTREMITY MMT:  MMT Right eval Left eval Right/Left 09/19/22  Hip flexion 4- 3+ 4+/4+  Hip extension 3 3 4+/4+  Hip abduction  Not tested due to pain.  4+/4+  Knee flexion 3+ 3 4+/4+  Knee extension 4- 4- 5/5   (Blank rows = not tested)  FUNCTIONAL TESTS:  Eval 5 times sit to stand: 14.22 sec  Timed up and go (TUG): 13.28 sec   09/05/22:  5 times sit to stand: 14.22 sec  Timed up and go (TUG): 13.28 sec   09/19/22 5 times sit to stand: 10.3 seconds GAIT: Distance walked: 68ft  Assistive device utilized: Single point cane Level of assistance: Complete Independence Comments: Pt walks with an antalgic gait pattern with decreased stride length due to limited knee extension on R knee and a hip shift due to pain on L side.    TODAY'S TREATMENT:        DATE:  10/03/22 TherEx Nu step L6 X 8 min UE/LE seat # 11 Step ups 6 inch: x 15 each LE Lateral step ups 6 inch step: x 15 leading with each LE Standing hip abd, marches and extensions: Level 4 band x 20 each Shuttle LE press DL 161# [id number] Shuttle LE press 62# 2 x15 single leg B Seated knee extension machine DL 45# [id number] Standing hip flexor stretch 30 sec X 2  09/19/22 TherEx Recumbent bike L3 X 10 min Updated measurments and goals for progress note Step ups 6 inch: x 15 each LE Lateral step ups 6 inch step: x 15 leading with each LE Standing hip abd, marches and extensions: Level 4 band x 15 each Shuttle LE press 106# x30 BLEs Shuttle LE press 56# 2 x15 single leg B Hamstring stretch x 2 each LE holding 30 sec Standing hip  flexor stretch 30 sec X 2   09/14/22 TherEx Recumbent bike L3 X 8 min Step ups 6 inch: x 10 each LE Lateral step ups 6 inch step: x 10 leading with each LE Standing hip abd, marches and extensions: Level 4 band x 15 each Shuttle LE press 100# x30 BLEs Shuttle LE press 50# 2 x15 single leg B Bridges feet on red physio-ball x 15 holding 5 sec Hamstring stretch x 2 each LE holding  30 sec    PATIENT EDUCATION:  Education details: Educated pt on anatomy and physiology of current symptoms, FOTO, diagnosis, prognosis, HEP,  and POC. Person educated: Patient Education method: Medical illustrator Education comprehension: verbalized understanding and returned demonstration  HOME EXERCISE PROGRAM: Access Code: [email or web address] URL: [email or web address] Date: 08/16/2022 Prepared by: Royal Hawthorn  Exercises - Seated Hamstring Stretch  - 2 x daily - 7 x weekly - 2 sets - 2 reps - 30 hold - Seated Long Arc Quad  - 2 x daily - 7 x weekly - 2 sets - 10 reps - Seated March  - 2 x daily - 7 x weekly - 2 sets - 10 reps   ASSESSMENT:  CLINICAL IMPRESSION:  We continued to focus on strengthening of his hip/knee within his pain tolerance and he will continue to benefit from this to improve his functional abilities.   OBJECTIVE IMPAIRMENTS: decreased activity tolerance, difficulty walking, decreased balance, decreased endurance, decreased mobility, decreased ROM, decreased strength, impaired flexibility, impaired LE use, postural dysfunction, and pain.  ACTIVITY LIMITATIONS: bending, lifting, carry, locomotion, cleaning, community activity, driving, and or occupation  PERSONAL FACTORS: DM, HTN, Hx of prostate CA, Hx of skin CA are also affecting patient's functional outcome.  REHAB POTENTIAL: Good  CLINICAL DECISION MAKING: Evolving/moderate complexity  EVALUATION COMPLEXITY: Moderate    GOALS: Short term PT Goals Target date: 08/30/2022  Pt will be I and compliant with  HEP. Baseline:  Goal status:MET 08/31/22 Pt will decrease L hip pain by 25% overall Baseline: Goal status: MET 08/31/22  Long term PT goals Target date: 10/11/2022  Pt will improve hip/knee strength to at least 4+/5 MMT to improve functional strength Baseline: Goal status: MET 09/19/22 Pt will improve FOTO to at least 59% functional to show improved function Baseline: Goal status: ongoing 08/31/22 Pt will reduce pain L hip pain by overall 50% overall with usual activity Baseline: Goal status: MET 09/19/22 Pt will be able to ambulate community distances at least 1000 ft WNL gait pattern without complaints and appropriate AD Baseline: Goal status: ongoing 4/180/24 Pt will be able to bend and squat with 2-3/10 pain in his L hip  Baseline: Goal status: ongoing 09/19/22 Pt will improve TUG by 3.4 seconds or Minimal clinically important difference.  Baseline: Goal status: MET 09/19/22 Pt will be prepared for R hip replacement and have a clear understanding of gait pattern/ stair negotiation following surgery.  Baseline: Goal status: ongoing 09/19/22   PLAN: PT FREQUENCY: 1-2 times per week   PT DURATION: 12 weeks  PLANNED INTERVENTIONS (unless contraindicated): aquatic PT, Canalith repositioning, cryotherapy, Electrical stimulation, Iontophoresis with 4 mg/ml dexamethasome, Moist heat, traction, Ultrasound, gait training, Therapeutic exercise, balance training, neuromuscular re-education, patient/family education, prosthetic training, manual techniques, passive ROM, dry needling, taping, vasopnuematic device, vestibular, spinal manipulations, joint manipulations  PLAN FOR NEXT SESSION: continue with hip/knee strength and ROM program. Add TRX squats  Ivery Quale, PT, DPT 10/03/22 8:13 AM

## 2022-10-05 ENCOUNTER — Ambulatory Visit (INDEPENDENT_AMBULATORY_CARE_PROVIDER_SITE_OTHER): Payer: Medicare Other | Admitting: Physical Therapy

## 2022-10-05 ENCOUNTER — Encounter: Payer: Self-pay | Admitting: Physical Therapy

## 2022-10-05 DIAGNOSIS — M6281 Muscle weakness (generalized): Secondary | ICD-10-CM

## 2022-10-05 DIAGNOSIS — M25551 Pain in right hip: Secondary | ICD-10-CM | POA: Diagnosis not present

## 2022-10-05 DIAGNOSIS — R2689 Other abnormalities of gait and mobility: Secondary | ICD-10-CM

## 2022-10-05 DIAGNOSIS — M25552 Pain in left hip: Secondary | ICD-10-CM

## 2022-10-05 NOTE — Therapy (Signed)
OUTPATIENT PHYSICAL THERAPY LOWER EXTREMITY TREATMENT    Patient Name: Ricardo Reese MRN: [id number] DOB:April 19, 1951, 72 y.o., male Today's Date: 10/05/2022  END OF SESSION:  PT End of Session - 10/05/22 0928     Visit Number 11    Number of Visits 24    Date for PT Re-Evaluation 11/15/22    Authorization Type Medicare    Progress Note Due on Visit 19    PT Start Time 0845    PT Stop Time 0930    PT Time Calculation (min) 45 min    Activity Tolerance Patient tolerated treatment well    Behavior During Therapy [facility] for tasks assessed/performed                   Past Medical History:  Diagnosis Date   Diabetes mellitus without complication    History of kidney stones    has had twice   Hyperlipidemia    Hypertension    Impaired fasting glucose    Kidney stone 1983   Melanoma of back 2013   Dr Adolphus Birchwood   Pneumonia    walking back in 2002   Prostate cancer 2019   Skin cancer 06/14/2011   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   Past Surgical History:  Procedure Laterality Date   COLONOSCOPY     KNEE SURGERY  1969   cartilage removal right knee    melanoma back  09/2010   PROSTATE BIOPSY     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   Procedure: LEFT TOTAL HIP ARTHROPLASTY ANTERIOR APPROACH;  Surgeon: Kathryne Hitch, MD;  Location: [facility] OR;  Service: Orthopedics;  Laterality: Left;   Patient Active Problem List   Diagnosis Date Noted   Left foot drop 12/31/2021   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   Right knee pain 10/25/2019   History of prostate cancer 01/02/2018   Advance directive discussed with patient 09/26/2016   Unilateral primary osteoarthritis, left hip 09/20/2016   Status post left hip replacement 09/20/2016   Routine general medical examination at a health care facility 08/17/2011   History of melanoma    Hyperlipemia 05/20/2008   Benign essential hypertension 05/20/2008   Type 2 diabetes mellitus with  other circulatory complications 05/20/2008    PCP: Karie Schwalbe, MD  REFERRING PROVIDER: Kathryne Hitch, MD   REFERRING DIAG: Acquired varus deformity knee, 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   PERTINENT HISTORY: DM, HTN, Hx of prostate CA, Hx of skin CA.  PAIN:   Are you having pain? Yes: NPRS scale: 0 upon arrival today/10 Pain location: R hip, L hip and R knee.  Pain description: Tooth ache, intermittent sharp pain in L hip.  Aggravating factors: walking, squatting, bending, twisting and bending a certain way  Relieving factors: Rest   PRECAUTIONS: None  WEIGHT BEARING RESTRICTIONS: No  FALLS:  Has patient fallen in last 6 months? 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   PLOF: Independent  PATIENT GOALS: Pt would like to strengthen his bilat LE's, minimize pain and prep for hip surgery. He would like to avoid knee surgery if possible.   OBJECTIVE:   DIAGNOSTIC FINDINGS: Pt reports severe OA in R hip and varus of R knee.   PATIENT SURVEYS:  FOTO 43.81%, 59% in 13 visits.   COGNITION: Overall cognitive status: Within functional limits for tasks assessed     SENSATION: WFL  MUSCLE LENGTH: Hamstrings: Right 80%  deg; Left WFL deg  POSTURE: No Significant postural limitations  PALPATION: L bursa  in hip  LOWER EXTREMITY ROM:   ROM Right eval Left eval Right/Left 09/19/22  Hip flexion [facility]   Hip extension     Hip abduction [facility]*   Hip internal rotation 50% 50%   Hip external rotation [facility]*   Knee flexion [facility]   Knee extension -8 (lacking) 0 10/10  Ankle dorsiflexion [facility]   Ankle plantarflexion WFL WFL    (Blank rows = not tested)  LOWER EXTREMITY MMT:  MMT Right eval Left eval Right/Left 09/19/22  Hip flexion 4- 3+ 4+/4+  Hip extension 3 3 4+/4+  Hip abduction  Not tested due to pain.  4+/4+  Knee flexion 3+ 3 4+/4+  Knee extension 4- 4- 5/5   (Blank rows = not tested)  FUNCTIONAL TESTS:  Eval 5 times sit to stand: 14.22 sec  Timed up and go (TUG): 13.28 sec   09/05/22:  5 times sit to stand: 14.22 sec  Timed up and go (TUG): 13.28 sec   09/19/22 5 times sit to stand: 10.3 seconds GAIT: Distance walked: 56ft  Assistive device utilized: Single point cane Level of assistance: Complete Independence Comments: Pt walks with an antalgic gait pattern with decreased stride length due to limited knee extension on R knee and a hip shift due to pain on L side.    TODAY'S TREATMENT:        DATE:  10/05/22 TherEx Bike L3 X 8 min Step ups 6 inch: x 15 each LE Lateral step ups 6 inch step: x 15 leading with each LE Standing hip abd, marches and extensions: Level 3 band x 15 each Shuttle LE press DL 161# 0R60 Shuttle LE press 68# 2 x15 single leg B Seated knee extension machine DL 45# [id number] Seated hamstring machine 35# 2X13 TRX squats 2X10    10/03/22 TherEx Nu step L6 X 8 min UE/LE seat # 11 Step ups 6 inch: x 15 each LE Lateral step ups 6 inch step: x 15 leading with each LE Standing hip abd, marches and extensions: Level 4 band x 20 each Shuttle LE press DL 119# [id number] Shuttle LE press 62# 2 x15 single leg B Seated knee extension machine DL 29# [id number] Standing hip flexor stretch 30 sec X 2    PATIENT EDUCATION:  Education details: Educated  pt on anatomy and physiology of current symptoms, FOTO, diagnosis, prognosis, HEP,  and POC. Person educated: Patient Education method: Medical illustrator Education comprehension: verbalized understanding and returned demonstration  HOME EXERCISE PROGRAM: Access Code: [email or web address] URL: [email or web address] Date: 08/16/2022 Prepared by: Royal Hawthorn  Exercises - Seated Hamstring Stretch  - 2 x daily - 7 x weekly - 2 sets - 2 reps - 30 hold - Seated Long Arc Quad  - 2 x daily -  7 x weekly - 2 sets - 10 reps - Seated March  - 2 x daily - 7 x weekly - 2 sets - 10 reps   ASSESSMENT:  CLINICAL IMPRESSION:  He showed good tolerance to strength progression and overall is able to perform more activity with less pain. PT recommending to continue with current plan.   OBJECTIVE IMPAIRMENTS: decreased activity tolerance, difficulty walking, decreased balance, decreased endurance, decreased mobility, decreased ROM, decreased strength, impaired flexibility, impaired LE use, postural dysfunction, and pain.  ACTIVITY LIMITATIONS: bending, lifting, carry, locomotion, cleaning, community activity, driving, and or occupation  PERSONAL FACTORS: DM, HTN, Hx of prostate CA, Hx of skin CA are also affecting patient's functional outcome.  REHAB POTENTIAL: Good  CLINICAL DECISION MAKING: Evolving/moderate complexity  EVALUATION COMPLEXITY: Moderate    GOALS: Short term PT Goals Target date: 08/30/2022  Pt will be I and compliant with HEP. Baseline:  Goal status:MET 08/31/22 Pt will decrease L hip pain by 25% overall Baseline: Goal status: MET 08/31/22  Long term PT goals Target date: 10/11/2022  Pt will improve hip/knee strength to at least 4+/5 MMT to improve functional strength Baseline: Goal status: MET 09/19/22 Pt will improve FOTO to at least 59% functional to show improved function Baseline: Goal status: ongoing 08/31/22 Pt will reduce pain L hip pain by overall 50%  overall with usual activity Baseline: Goal status: MET 09/19/22 Pt will be able to ambulate community distances at least 1000 ft WNL gait pattern without complaints and appropriate AD Baseline: Goal status: ongoing 4/180/24 Pt will be able to bend and squat with 2-3/10 pain in his L hip  Baseline: Goal status: ongoing 09/19/22 Pt will improve TUG by 3.4 seconds or Minimal clinically important difference.  Baseline: Goal status: MET 09/19/22 Pt will be prepared for R hip replacement and have a clear understanding of gait pattern/ stair negotiation following surgery.  Baseline: Goal status: ongoing 09/19/22   PLAN: PT FREQUENCY: 1-2 times per week   PT DURATION: 12 weeks  PLANNED INTERVENTIONS (unless contraindicated): aquatic PT, Canalith repositioning, cryotherapy, Electrical stimulation, Iontophoresis with 4 mg/ml dexamethasome, Moist heat, traction, Ultrasound, gait training, Therapeutic exercise, balance training, neuromuscular re-education, patient/family education, prosthetic training, manual techniques, passive ROM, dry needling, taping, vasopnuematic device, vestibular, spinal manipulations, joint manipulations  PLAN FOR NEXT SESSION: continue with hip/knee strength and ROM program.  Ivery Quale, PT, DPT 10/05/22 9:29 AM

## 2022-10-10 ENCOUNTER — Ambulatory Visit (INDEPENDENT_AMBULATORY_CARE_PROVIDER_SITE_OTHER): Payer: Medicare Other | Admitting: Physical Therapy

## 2022-10-10 ENCOUNTER — Encounter: Payer: Self-pay | Admitting: Physical Therapy

## 2022-10-10 DIAGNOSIS — M25561 Pain in right knee: Secondary | ICD-10-CM

## 2022-10-10 DIAGNOSIS — M25552 Pain in left hip: Secondary | ICD-10-CM

## 2022-10-10 DIAGNOSIS — M6281 Muscle weakness (generalized): Secondary | ICD-10-CM | POA: Diagnosis not present

## 2022-10-10 DIAGNOSIS — M25551 Pain in right hip: Secondary | ICD-10-CM | POA: Diagnosis not present

## 2022-10-10 DIAGNOSIS — R2689 Other abnormalities of gait and mobility: Secondary | ICD-10-CM

## 2022-10-10 NOTE — Therapy (Signed)
OUTPATIENT PHYSICAL THERAPY LOWER EXTREMITY TREATMENT    Patient Name: Ricardo Reese MRN: [id number] DOB:September 24, 1950, 72 y.o., male Today's Date: 10/10/2022  END OF SESSION:  PT End of Session - 10/10/22 0835     Visit Number 12    Number of Visits 24    Date for PT Re-Evaluation 11/15/22    Authorization Type Medicare    Progress Note Due on Visit 19    PT Start Time 0800    PT Stop Time 0843    PT Time Calculation (min) 43 min    Activity Tolerance Patient tolerated treatment well    Behavior During Therapy [facility] for tasks assessed/performed                    Past Medical History:  Diagnosis Date   Diabetes mellitus without complication (HCC)    History of kidney stones    has had twice   Hyperlipidemia    Hypertension    Impaired fasting glucose    Kidney stone 1983   Melanoma of back [facility]) 2013   Dr Adolphus Birchwood   Pneumonia    walking back in 2002   Prostate cancer (HCC) 2019   Skin cancer 06/14/2011   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   Past Surgical History:  Procedure Laterality Date   COLONOSCOPY     KNEE SURGERY  1969   cartilage removal right knee    melanoma back  09/2010   PROSTATE BIOPSY     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   Procedure: LEFT TOTAL HIP ARTHROPLASTY ANTERIOR APPROACH;  Surgeon: Kathryne Hitch, MD;  Location: [facility] OR;  Service: Orthopedics;  Laterality: Left;   Patient Active Problem List   Diagnosis Date Noted   Left foot drop 12/31/2021   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   Right knee pain 10/25/2019   History of prostate cancer 01/02/2018   Advance directive discussed with patient 09/26/2016   Unilateral primary osteoarthritis, left hip 09/20/2016   Status post left hip replacement 09/20/2016   Routine general medical examination at a health care facility 08/17/2011   History of melanoma    Hyperlipemia 05/20/2008   Benign essential hypertension 05/20/2008   Type 2  diabetes mellitus with other circulatory complications (HCC) 05/20/2008    PCP: Karie Schwalbe, MD  REFERRING PROVIDER: Kathryne Hitch, MD   REFERRING DIAG: Acquired varus deformity knee, 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  PAIN:   Are you having pain? Yes: NPRS scale: 1 upon arrival today/10 Pain location:  R knee.  Pain description: Tooth ache, intermittent sharp pain in L hip.  Aggravating factors: walking, squatting, bending, twisting and bending a certain way  Relieving factors: Rest   PRECAUTIONS: None  WEIGHT BEARING RESTRICTIONS: No  FALLS:  Has patient fallen in last 6 months? No  LIVING ENVIRONMENT: Lives with: lives with their spouse Lives in: House/apartment Stairs: Yes: Internal: 15 steps; on right going up and External: 4 steps; on  right going up Has following equipment at home: Single point cane  OCCUPATION: Retired from Nurse, children's.   PLOF: Independent  PATIENT GOALS: Pt would like to strengthen his bilat LE's, minimize pain and prep for hip surgery. He would like to avoid knee surgery if possible.   OBJECTIVE:   DIAGNOSTIC FINDINGS: Pt reports severe OA in R hip and varus of R knee.   PATIENT SURVEYS:  FOTO 43.81%, 59% in 13 visits.   COGNITION: Overall cognitive status: Within functional limits for tasks assessed     SENSATION: WFL  MUSCLE LENGTH: Hamstrings: Right 80%  deg; Left WFL deg  POSTURE: No Significant postural limitations  PALPATION: L bursa in  hip  LOWER EXTREMITY ROM:   ROM Right eval Left eval Right/Left 09/19/22  Hip flexion [facility]   Hip extension     Hip abduction [facility]*   Hip internal rotation 50% 50%   Hip external rotation [facility]*   Knee flexion [facility]   Knee extension -8 (lacking) 0 10/10  Ankle dorsiflexion [facility]   Ankle plantarflexion WFL WFL    (Blank rows = not tested)  LOWER EXTREMITY MMT:  MMT Right eval Left eval Right/Left 09/19/22  Hip flexion 4- 3+ 4+/4+  Hip extension 3 3 4+/4+  Hip abduction  Not tested due to pain.  4+/4+  Knee flexion 3+ 3 4+/4+  Knee extension 4- 4- 5/5   (Blank rows = not tested)  FUNCTIONAL TESTS:  Eval 5 times sit to stand: 14.22 sec  Timed up and go (TUG): 13.28 sec   09/05/22:  5 times sit to stand: 14.22 sec  Timed up and go (TUG): 13.28 sec   09/19/22 5 times sit to stand: 10.3 seconds GAIT: Distance walked: 59ft  Assistive device utilized: Single point cane Level of assistance: Complete Independence Comments: Pt walks with an antalgic gait pattern with decreased stride length due to limited knee extension on R knee and a hip shift due to pain on L side.    TODAY'S TREATMENT:        DATE:  10/10/22 TherEx Sci fit bike L7 X 8 min Step ups 6 inch: x 15 each LE Lateral step ups 6 inch step: x 15 leading with each LE Standing hip abd, marches and extensions: Level 3 band x 15 each Shuttle LE press DL 161# [id number] Shuttle LE press 75# 2 x15 single leg B Seated knee extension machine DL 45# [id number] Seated hamstring machine 35# 2X15 TRX squats 2X10  10/05/22 TherEx Bike L3 X 8 min Step ups 6 inch: x 15 each LE Lateral step ups 6 inch step: x 15 leading with each LE Standing hip abd, marches and extensions: Level 3 band x 15 each Shuttle LE press DL 119# [id number] Shuttle LE press 68# 2 x15 single leg B Seated knee extension machine DL 29# [id number] Seated hamstring machine 35# 2X13 TRX squats 2X10    10/03/22 TherEx Nu step L6 X 8 min UE/LE seat #  11 Step ups 6 inch: x 15 each LE Lateral step ups 6 inch step: x 15 leading with each LE Standing hip abd, marches and extensions: Level 4 band x 20 each Shuttle LE press DL 308# [id number] Shuttle LE press 62# 2 x15 single leg B Seated knee extension machine DL 46# [id number] Standing hip flexor stretch 30 sec X 2    PATIENT EDUCATION:  Education details: Educated pt on anatomy and physiology of current symptoms, FOTO, diagnosis, prognosis, HEP,  and POC. Person educated: Patient Education method: Medical illustrator Education comprehension: verbalized understanding and returned demonstration  HOME EXERCISE PROGRAM: Access Code: [email or web address] URL: [email or web address] Date: 08/16/2022 Prepared by: Royal Hawthorn  Exercises - Seated Hamstring Stretch  - 2 x daily - 7 x weekly - 2 sets - 2 reps - 30 hold - Seated Long Arc Quad  - 2 x daily - 7 x weekly - 2 sets - 10 reps - Seated March  - 2 x daily - 7 x weekly - 2 sets - 10 reps   ASSESSMENT:  CLINICAL IMPRESSION:  He is making good functional progress with strength and able to do more activity such as golf with less overall pain. Continue current plan.  OBJECTIVE IMPAIRMENTS: decreased activity tolerance, difficulty walking, decreased balance, decreased endurance, decreased mobility, decreased ROM, decreased strength, impaired flexibility, impaired LE use, postural dysfunction, and pain.  ACTIVITY LIMITATIONS: bending, lifting, carry, locomotion, cleaning, community activity, driving, and or occupation  PERSONAL FACTORS: DM, HTN, Hx of prostate CA, Hx of skin CA are also affecting patient's functional outcome.  REHAB POTENTIAL: Good  CLINICAL DECISION MAKING: Evolving/moderate complexity  EVALUATION COMPLEXITY: Moderate    GOALS: Short term PT Goals Target date: 08/30/2022  Pt will be I and compliant with HEP. Baseline:  Goal status:MET 08/31/22 Pt will decrease L hip pain by 25% overall Baseline: Goal  status: MET 08/31/22  Long term PT goals Target date: 10/11/2022  Pt will improve hip/knee strength to at least 4+/5 MMT to improve functional strength Baseline: Goal status: MET 09/19/22 Pt will improve FOTO to at least 59% functional to show improved function Baseline: Goal status: ongoing 08/31/22 Pt will reduce pain L hip pain by overall 50% overall with usual activity Baseline: Goal status: MET 09/19/22 Pt will be able to ambulate community distances at least 1000 ft WNL gait pattern without complaints and appropriate AD Baseline: Goal status: ongoing 4/180/24 Pt will be able to bend and squat with 2-3/10 pain in his L hip  Baseline: Goal status: ongoing 09/19/22 Pt will improve TUG by 3.4 seconds or Minimal clinically important difference.  Baseline: Goal status: MET 09/19/22 Pt will be prepared for R hip replacement and have a clear understanding of gait pattern/ stair negotiation following surgery.  Baseline: Goal status: ongoing 09/19/22   PLAN: PT FREQUENCY: 1-2 times per week   PT DURATION: 12 weeks  PLANNED INTERVENTIONS (unless contraindicated): aquatic PT, Canalith repositioning, cryotherapy, Electrical stimulation, Iontophoresis with 4 mg/ml dexamethasome, Moist heat, traction, Ultrasound, gait training, Therapeutic exercise, balance training, neuromuscular re-education, patient/family education, prosthetic training, manual techniques, passive ROM, dry needling, taping, vasopnuematic device, vestibular, spinal manipulations, joint manipulations  PLAN FOR NEXT SESSION: continue with hip/knee strength and ROM program.  Ivery Quale, PT, DPT 10/10/22 8:35 AM

## 2022-10-12 ENCOUNTER — Encounter: Payer: Medicare Other | Admitting: Physical Therapy

## 2022-10-17 ENCOUNTER — Encounter: Payer: Self-pay | Admitting: Physical Therapy

## 2022-10-17 ENCOUNTER — Ambulatory Visit (INDEPENDENT_AMBULATORY_CARE_PROVIDER_SITE_OTHER): Payer: Medicare Other | Admitting: Physical Therapy

## 2022-10-17 DIAGNOSIS — M25552 Pain in left hip: Secondary | ICD-10-CM

## 2022-10-17 DIAGNOSIS — M25561 Pain in right knee: Secondary | ICD-10-CM | POA: Diagnosis not present

## 2022-10-17 DIAGNOSIS — R2689 Other abnormalities of gait and mobility: Secondary | ICD-10-CM | POA: Diagnosis not present

## 2022-10-17 DIAGNOSIS — M6281 Muscle weakness (generalized): Secondary | ICD-10-CM | POA: Diagnosis not present

## 2022-10-17 DIAGNOSIS — M25551 Pain in right hip: Secondary | ICD-10-CM | POA: Diagnosis not present

## 2022-10-17 NOTE — Therapy (Signed)
OUTPATIENT PHYSICAL THERAPY LOWER EXTREMITY TREATMENT    Patient Name: Ricardo Reese MRN: [id number] DOB:29-Mar-1951, 72 y.o., male Today's Date: 10/17/2022  END OF SESSION:  PT End of Session - 10/17/22 0809     Visit Number 13    Number of Visits 24    Date for PT Re-Evaluation 11/15/22    Authorization Type Medicare    Progress Note Due on Visit 19    PT Start Time 0800    PT Stop Time 0844    PT Time Calculation (min) 44 min    Activity Tolerance Patient tolerated treatment well    Behavior During Therapy [facility] for tasks assessed/performed                    Past Medical History:  Diagnosis Date   Diabetes mellitus without complication (HCC)    History of kidney stones    has had twice   Hyperlipidemia    Hypertension    Impaired fasting glucose    Kidney stone 1983   Melanoma of back [facility]) 2013   Dr Adolphus Birchwood   Pneumonia    walking back in 2002   Prostate cancer (HCC) 2019   Skin cancer 06/14/2011   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   Past Surgical History:  Procedure Laterality Date   COLONOSCOPY     KNEE SURGERY  1969   cartilage removal right knee    melanoma back  09/2010   PROSTATE BIOPSY     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   Procedure: LEFT TOTAL HIP ARTHROPLASTY ANTERIOR APPROACH;  Surgeon: Kathryne Hitch, MD;  Location: [facility] OR;  Service: Orthopedics;  Laterality: Left;   Patient Active Problem List   Diagnosis Date Noted   Left foot drop 12/31/2021   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   Right knee pain 10/25/2019   History of prostate cancer 01/02/2018   Advance directive discussed with patient 09/26/2016   Unilateral primary osteoarthritis, left hip 09/20/2016   Status post left hip replacement 09/20/2016   Routine general medical examination at a health care facility 08/17/2011   History of melanoma    Hyperlipemia 05/20/2008   Benign essential hypertension 05/20/2008   Type 2  diabetes mellitus with other circulatory complications (HCC) 05/20/2008    PCP: Karie Schwalbe, MD  REFERRING PROVIDER: Kathryne Hitch, MD   REFERRING DIAG: Acquired varus deformity knee, 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  PAIN:   Are you having pain? Yes: NPRS scale: 1 upon arrival today/10 Pain location:  R knee.  Pain description: Tooth ache, intermittent sharp pain in L hip.  Aggravating factors: walking, squatting, bending, twisting and bending a certain way  Relieving factors: Rest   PRECAUTIONS: None  WEIGHT BEARING RESTRICTIONS: No  FALLS:  Has patient fallen in last 6 months? 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   PLOF: Independent  PATIENT GOALS: Pt would like to strengthen his bilat LE's, minimize pain and prep for hip surgery. He would like to avoid knee surgery if possible.   OBJECTIVE:   DIAGNOSTIC FINDINGS: Pt reports severe OA in R hip and varus of R knee.   PATIENT SURVEYS:  FOTO 43.81%, 59% in 13 visits.   COGNITION: Overall cognitive status: Within functional limits for tasks assessed     SENSATION: WFL  MUSCLE LENGTH: Hamstrings: Right 80%  deg; Left WFL deg  POSTURE: No Significant postural limitations  PALPATION: L bursa in hip  LOWER EXTREMITY ROM:   ROM Right eval  Left eval Right/Left 09/19/22  Hip flexion [facility]   Hip extension     Hip abduction [facility]*   Hip internal rotation 50% 50%   Hip external rotation [facility]*   Knee flexion [facility]   Knee extension -8 (lacking) 0 10/10  Ankle dorsiflexion [facility]   Ankle plantarflexion WFL WFL    (Blank rows = not tested)  LOWER EXTREMITY MMT:  MMT Right eval Left eval Right/Left 09/19/22  Hip flexion 4- 3+ 4+/4+  Hip extension 3 3 4+/4+  Hip abduction  Not tested due to pain.  4+/4+  Knee flexion 3+ 3 4+/4+  Knee extension 4- 4- 5/5   (Blank rows = not tested)  FUNCTIONAL TESTS:  Eval 5 times sit to stand: 14.22 sec  Timed up and go (TUG): 13.28 sec   09/05/22:  5 times sit to stand: 14.22 sec  Timed up and go (TUG): 13.28 sec   09/19/22 5 times sit to stand: 10.3 seconds GAIT: Distance walked: 74ft  Assistive device utilized: Single point cane Level of assistance: Complete Independence Comments: Pt walks with an antalgic gait pattern with decreased stride length due to limited knee extension on R knee and a hip shift due to pain on L side.    TODAY'S TREATMENT:        DATE:  10/17/22 TherEx Nu step L6 X 10 min UE/LE Step ups 6 inch: x 15 each LE Lateral step ups and forward step ups 6 inch step: x 15 leading with each LE Standing hip abd, marches and extensions: Level 3 band x 20 each Shuttle LE press DL 086# [id number] Shuttle LE press 75# 2 x15 single leg B Seated knee extension machine DL 69# [id number] Seated hamstring machine 35# 2X15 TRX squats 2X10  10/10/22 TherEx Sci fit bike L7 X 8 min Step ups 6 inch: x 15 each LE Lateral step ups 6 inch step: x 15 leading with each LE Standing hip abd, marches and extensions: Level 3 band x 15 each Shuttle LE press DL 284# [id number] Shuttle LE press 75# 2 x15 single leg B Seated knee extension machine DL 40# [id number] Seated hamstring machine 35# 2X15 TRX squats 2X10    PATIENT EDUCATION:  Education details: Educated pt on anatomy and  physiology of current symptoms, FOTO, diagnosis, prognosis, HEP,  and POC. Person educated: Patient Education method: Medical illustrator Education comprehension: verbalized understanding and returned demonstration  HOME EXERCISE PROGRAM: Access Code: [email or web address] URL: [email or web address] Date: 08/16/2022 Prepared by: Royal Hawthorn  Exercises - Seated Hamstring Stretch  - 2 x daily - 7 x weekly - 2 sets - 2 reps - 30 hold - Seated Long Arc Quad  - 2 x daily - 7 x weekly - 2 sets - 10 reps - Seated March  -  2 x daily - 7 x weekly - 2 sets - 10 reps   ASSESSMENT:  CLINICAL IMPRESSION:  He has done well with PT, he has one more visit left so we will reassess his measurements and goals to see if he is ready to transition to independent program.  OBJECTIVE IMPAIRMENTS: decreased activity tolerance, difficulty walking, decreased balance, decreased endurance, decreased mobility, decreased ROM, decreased strength, impaired flexibility, impaired LE use, postural dysfunction, and pain.  ACTIVITY LIMITATIONS: bending, lifting, carry, locomotion, cleaning, community activity, driving, and or occupation  PERSONAL FACTORS: DM, HTN, Hx of prostate CA, Hx of skin CA are also affecting patient's functional outcome.  REHAB POTENTIAL: Good  CLINICAL DECISION MAKING: Evolving/moderate complexity  EVALUATION COMPLEXITY: Moderate    GOALS: Short term PT Goals Target date: 08/30/2022  Pt will be I and compliant with HEP. Baseline:  Goal status:MET 08/31/22 Pt will decrease L hip pain by 25% overall Baseline: Goal status: MET 08/31/22  Long term PT goals Target date: 10/11/2022  Pt will improve hip/knee strength to at least 4+/5 MMT to improve functional strength Baseline: Goal status: MET 09/19/22 Pt will improve FOTO to at least 59% functional to show improved function Baseline: Goal status: ongoing 08/31/22 Pt will reduce pain L hip pain by overall 50% overall with usual  activity Baseline: Goal status: MET 09/19/22 Pt will be able to ambulate community distances at least 1000 ft WNL gait pattern without complaints and appropriate AD Baseline: Goal status: ongoing 4/180/24 Pt will be able to bend and squat with 2-3/10 pain in his L hip  Baseline: Goal status: ongoing 09/19/22 Pt will improve TUG by 3.4 seconds or Minimal clinically important difference.  Baseline: Goal status: MET 09/19/22 Pt will be prepared for R hip replacement and have a clear understanding of gait pattern/ stair negotiation following surgery.  Baseline: Goal status: ongoing 09/19/22   PLAN: PT FREQUENCY: 1-2 times per week   PT DURATION: 12 weeks  PLANNED INTERVENTIONS (unless contraindicated): aquatic PT, Canalith repositioning, cryotherapy, Electrical stimulation, Iontophoresis with 4 mg/ml dexamethasome, Moist heat, traction, Ultrasound, gait training, Therapeutic exercise, balance training, neuromuscular re-education, patient/family education, prosthetic training, manual techniques, passive ROM, dry needling, taping, vasopnuematic device, vestibular, spinal manipulations, joint manipulations  PLAN FOR NEXT SESSION: He has one more visit scheduled so reassess FOTO and goals  Ivery Quale, PT, DPT 10/17/22 8:30 AM

## 2022-10-19 ENCOUNTER — Encounter: Payer: Self-pay | Admitting: Physical Therapy

## 2022-10-19 ENCOUNTER — Ambulatory Visit (INDEPENDENT_AMBULATORY_CARE_PROVIDER_SITE_OTHER): Payer: Medicare Other | Admitting: Physical Therapy

## 2022-10-19 DIAGNOSIS — M25552 Pain in left hip: Secondary | ICD-10-CM | POA: Diagnosis not present

## 2022-10-19 DIAGNOSIS — R2689 Other abnormalities of gait and mobility: Secondary | ICD-10-CM

## 2022-10-19 DIAGNOSIS — M25551 Pain in right hip: Secondary | ICD-10-CM

## 2022-10-19 DIAGNOSIS — M6281 Muscle weakness (generalized): Secondary | ICD-10-CM | POA: Diagnosis not present

## 2022-10-19 DIAGNOSIS — M25561 Pain in right knee: Secondary | ICD-10-CM

## 2022-10-19 NOTE — Therapy (Signed)
OUTPATIENT PHYSICAL THERAPY LOWER EXTREMITY TREATMENT/Discharge PHYSICAL THERAPY DISCHARGE SUMMARY  Visits from Start of Care: 14  Current functional level related to goals / functional outcomes: See below   Remaining deficits: See below   Education / Equipment: HEP  Plan:  Patient goals were met. Patient is being discharged due to meeting the established PT goals.        Patient Name: Ricardo Reese MRN: [id number] DOB:Oct 28, 1950, 72 y.o., male Today's Date: 10/19/2022  END OF SESSION:  PT End of Session - 10/19/22 0842     Visit Number 14    Number of Visits 24    Date for PT Re-Evaluation 11/15/22    Authorization Type Medicare    Progress Note Due on Visit 19    PT Start Time 0800    PT Stop Time 0840    PT Time Calculation (min) 40 min    Activity Tolerance Patient tolerated treatment well    Behavior During Therapy [facility] for tasks assessed/performed                     Past Medical History:  Diagnosis Date   Diabetes mellitus without complication (HCC)    History of kidney stones    has had twice   Hyperlipidemia    Hypertension    Impaired fasting glucose    Kidney stone 1983   Melanoma of back [facility]) 2013   Dr Adolphus Birchwood   Pneumonia    walking back in 2002   Prostate cancer (HCC) 2019   Skin cancer 06/14/2011   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   Past Surgical History:  Procedure Laterality Date   COLONOSCOPY     KNEE SURGERY  1969   cartilage removal right knee    melanoma back  09/2010   PROSTATE BIOPSY     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   Procedure: LEFT TOTAL HIP ARTHROPLASTY ANTERIOR APPROACH;  Surgeon: Kathryne Hitch, MD;  Location: [facility] OR;  Service: Orthopedics;  Laterality: Left;   Patient Active Problem List   Diagnosis Date Noted   Left foot drop 12/31/2021   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   Right knee pain 10/25/2019   History of prostate cancer 01/02/2018   Advance  directive discussed with patient 09/26/2016   Unilateral primary osteoarthritis, left hip 09/20/2016   Status post left hip replacement 09/20/2016   Routine general medical examination at a health care facility 08/17/2011   History of melanoma    Hyperlipemia 05/20/2008   Benign essential hypertension 05/20/2008   Type 2 diabetes mellitus with other circulatory complications (HCC) 05/20/2008    PCP: Karie Schwalbe, MD  REFERRING PROVIDER: Kathryne Hitch, MD   REFERRING DIAG: Acquired varus deformity knee, 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  PAIN:   Are you having pain? Yes: NPRS scale: 1 upon arrival today/10 Pain location:  R knee.  Pain description: Tooth ache, intermittent sharp pain in L hip.  Aggravating factors: walking, squatting, bending, twisting and bending a certain way  Relieving factors: Rest   PRECAUTIONS: None  WEIGHT BEARING RESTRICTIONS: No  FALLS:  Has patient fallen in last 6 months? No  LIVING ENVIRONMENT: Lives with: lives with their spouse Lives in: House/apartment Stairs: Yes: Internal: 15 steps; on right going up and External: 4 steps; on right going up Has following equipment at home: Single point cane  OCCUPATION: Retired from Nurse, children's.   PLOF: Independent  PATIENT GOALS: Pt would like to strengthen his bilat LE's, minimize pain and prep for hip surgery. He would like to avoid knee surgery if possible.   OBJECTIVE:   DIAGNOSTIC FINDINGS: Pt reports severe OA in R hip and varus of R knee.   PATIENT  SURVEYS:  FOTO 43.81%, 59% in 13 visits.   COGNITION: Overall cognitive status: Within functional limits for tasks assessed     SENSATION: WFL  MUSCLE LENGTH: Hamstrings: Right 80%  deg; Left WFL deg  POSTURE: No Significant postural limitations  PALPATION: L bursa in hip  LOWER EXTREMITY ROM:   ROM Right eval Left eval Right/Left 09/19/22 Right/Left 10/19/22  Hip flexion [facility]  WFL  Hip extension      Hip abduction [facility] WFL*  WFL  Hip internal rotation 50% 50%  WFL  Hip external rotation [facility]*    Knee flexion [facility]    Knee extension -8 (lacking) 0 10/10 [facility]  Ankle dorsiflexion [facility]    Ankle plantarflexion WFL WFL     (Blank rows = not tested)  LOWER EXTREMITY MMT:  MMT Right eval Left eval Right/Left 09/19/22 Right/Left 10/19/22  Hip flexion 4- 3+ 4+/4+ 5/5  Hip extension 3 3 4+/4+ 5/5  Hip abduction  Not tested due to pain.  4+/4+ 5/5  Knee flexion 3+ 3 4+/4+ 5/5  Knee extension 4- 4- 5/5 5/5d   (Blank rows = not tested)  FUNCTIONAL TESTS:  Eval 5 times sit to stand: 14.22 sec  Timed up and go (TUG): 13.28 sec   09/05/22:  5 times sit to stand: 14.22 sec  Timed up and go (TUG): 13.28 sec   09/19/22 5 times sit to stand: 10.3 seconds  10/19/22 Timed up and go (TUG) 6.7 seconds GAIT: Distance walked: 73ft  Assistive device utilized: Single point cane Level of assistance: Complete Independence Comments: Pt walks with an antalgic gait pattern with decreased stride length due to limited knee extension on R knee and a hip shift due to pain on L side.    TODAY'S TREATMENT:        DATE:  10/19/22 TherEx Recumbent bike L4 X 10 min Lateral stepping with green band with squat X 10 Standing hip abd, marches and extensions: Level 3 band x 20 each Seated knee extension machine DL 16# [id number] Seated hamstring machine 35# [id number] Updated measurements, goals, FOTO     PATIENT EDUCATION:  Education details: Educated pt on anatomy and physiology of current  symptoms, FOTO, diagnosis, prognosis, HEP,  and POC. Person educated: Patient Education method: Medical illustrator Education comprehension: verbalized understanding and returned demonstration  HOME EXERCISE PROGRAM: Access Code: [email or web address] URL: [email or web address] Date: 10/19/2022 Prepared by: Ivery Quale  Exercises - Seated Hamstring Stretch  - 1 x daily - 3 x weekly - 2 sets - 2 reps - 30 hold - Standing Hip Flexion with Resistance Loop  - 1 x daily - 3 x weekly - 2-3 sets - 15-20 reps - Hip Abduction with Resistance Loop  - 1 x daily - 3 x weekly - 1-2 sets -  15-20 reps - Standing Hip Extension Kicks  - 1 x daily - 3 x weekly - 3 sets - 15-20 reps - Squat with Chair Touch  - 1 x daily - 3 x weekly - 2 sets - 10 reps - Side Stepping with Resistance at Thighs  - 1 x daily - 3 x weekly - 2 sets - 10 reps   ASSESSMENT:  CLINICAL IMPRESSION:  He has met all PT goals and will be discharged to independent program which was updated and reviewed with him. He made excellent overall progress with PT.  OBJECTIVE IMPAIRMENTS: decreased activity tolerance, difficulty walking, decreased balance, decreased endurance, decreased mobility, decreased ROM, decreased strength, impaired flexibility, impaired LE use, postural dysfunction, and pain.  ACTIVITY LIMITATIONS: bending, lifting, carry, locomotion, cleaning, community activity, driving, and or occupation  PERSONAL FACTORS: DM, HTN, Hx of prostate CA, Hx of skin CA are also affecting patient's functional outcome.  REHAB POTENTIAL: Good  CLINICAL DECISION MAKING: Evolving/moderate complexity  EVALUATION COMPLEXITY: Moderate    GOALS: Short term PT Goals Target date: 08/30/2022  Pt will be I and compliant with HEP. Baseline:  Goal status:MET 08/31/22 Pt will decrease L hip pain by 25% overall Baseline: Goal status: MET 08/31/22  Long term PT goals Target date: 10/11/2022  Pt will improve hip/knee strength to  at least 4+/5 MMT to improve functional strength Baseline: Goal status: MET 09/19/22 Pt will improve FOTO to at least 59% functional to show improved function Baseline: Goal status: MET 10/19/22 Pt will reduce pain L hip pain by overall 50% overall with usual activity Baseline: Goal status: MET 09/19/22 Pt will be able to ambulate community distances at least 1000 ft WNL gait pattern without complaints and appropriate AD Baseline: Goal status: MET 10/19/22 Pt will be able to bend and squat with 2-3/10 pain in his L hip  Baseline: Goal status: MET 10/19/22 Pt will improve TUG by 3.4 seconds or Minimal clinically important difference.  Baseline: Goal status: MET 09/19/22 Pt will be prepared for R hip replacement and have a clear understanding of gait pattern/ stair negotiation following surgery.  Baseline: Goal status: MET 10/19/22   PLAN: PT FREQUENCY: 1-2 times per week   PT DURATION: 12 weeks  PLANNED INTERVENTIONS (unless contraindicated): aquatic PT, Canalith repositioning, cryotherapy, Electrical stimulation, Iontophoresis with 4 mg/ml dexamethasome, Moist heat, traction, Ultrasound, gait training, Therapeutic exercise, balance training, neuromuscular re-education, patient/family education, prosthetic training, manual techniques, passive ROM, dry needling, taping, vasopnuematic device, vestibular, spinal manipulations, joint manipulations  PLAN FOR NEXT SESSION: DC today Ivery Quale, PT, DPT 10/19/22 8:42 AM

## 2022-12-17 ENCOUNTER — Other Ambulatory Visit: Payer: Self-pay | Admitting: Internal Medicine

## 2022-12-21 DIAGNOSIS — Z85820 Personal history of malignant melanoma of skin: Secondary | ICD-10-CM | POA: Diagnosis not present

## 2022-12-21 DIAGNOSIS — D225 Melanocytic nevi of trunk: Secondary | ICD-10-CM | POA: Diagnosis not present

## 2022-12-21 DIAGNOSIS — D0471 Carcinoma in situ of skin of right lower limb, including hip: Secondary | ICD-10-CM | POA: Diagnosis not present

## 2022-12-21 DIAGNOSIS — D485 Neoplasm of uncertain behavior of skin: Secondary | ICD-10-CM | POA: Diagnosis not present

## 2022-12-21 DIAGNOSIS — D2261 Melanocytic nevi of right upper limb, including shoulder: Secondary | ICD-10-CM | POA: Diagnosis not present

## 2022-12-21 DIAGNOSIS — D2262 Melanocytic nevi of left upper limb, including shoulder: Secondary | ICD-10-CM | POA: Diagnosis not present

## 2022-12-21 DIAGNOSIS — D2272 Melanocytic nevi of left lower limb, including hip: Secondary | ICD-10-CM | POA: Diagnosis not present

## 2022-12-21 DIAGNOSIS — L570 Actinic keratosis: Secondary | ICD-10-CM | POA: Diagnosis not present

## 2023-01-05 ENCOUNTER — Encounter: Payer: Medicare Other | Admitting: Internal Medicine

## 2023-01-11 ENCOUNTER — Encounter (INDEPENDENT_AMBULATORY_CARE_PROVIDER_SITE_OTHER): Payer: Self-pay

## 2023-01-17 ENCOUNTER — Other Ambulatory Visit: Payer: Self-pay | Admitting: Internal Medicine

## 2023-01-23 ENCOUNTER — Other Ambulatory Visit: Payer: Self-pay | Admitting: Internal Medicine

## 2023-01-24 ENCOUNTER — Ambulatory Visit (INDEPENDENT_AMBULATORY_CARE_PROVIDER_SITE_OTHER): Payer: Medicare Other | Admitting: Internal Medicine

## 2023-01-24 ENCOUNTER — Encounter: Payer: Self-pay | Admitting: Internal Medicine

## 2023-01-24 VITALS — BP 122/60 | HR 48 | Temp 97.9°F | Ht 69.25 in | Wt 199.0 lb

## 2023-01-24 DIAGNOSIS — Z0000 Encounter for general adult medical examination without abnormal findings: Secondary | ICD-10-CM

## 2023-01-24 DIAGNOSIS — E1159 Type 2 diabetes mellitus with other circulatory complications: Secondary | ICD-10-CM

## 2023-01-24 DIAGNOSIS — M25561 Pain in right knee: Secondary | ICD-10-CM

## 2023-01-24 DIAGNOSIS — R4189 Other symptoms and signs involving cognitive functions and awareness: Secondary | ICD-10-CM | POA: Diagnosis not present

## 2023-01-24 DIAGNOSIS — I10 Essential (primary) hypertension: Secondary | ICD-10-CM

## 2023-01-24 DIAGNOSIS — Z7984 Long term (current) use of oral hypoglycemic drugs: Secondary | ICD-10-CM

## 2023-01-24 DIAGNOSIS — G8929 Other chronic pain: Secondary | ICD-10-CM

## 2023-01-24 LAB — HEPATIC FUNCTION PANEL
ALT: 18 U/L (ref 0–53)
AST: 20 U/L (ref 0–37)
Albumin: 4.6 g/dL (ref 3.5–5.2)
Alkaline Phosphatase: 59 U/L (ref 39–117)
Bilirubin, Direct: 0.1 mg/dL (ref 0.0–0.3)
Total Bilirubin: 0.6 mg/dL (ref 0.2–1.2)
Total Protein: 7 g/dL (ref 6.0–8.3)

## 2023-01-24 LAB — RENAL FUNCTION PANEL
Albumin: 4.6 g/dL (ref 3.5–5.2)
BUN: 25 mg/dL — ABNORMAL HIGH (ref 6–23)
CO2: 27 mEq/L (ref 19–32)
Calcium: 10.2 mg/dL (ref 8.4–10.5)
Chloride: 100 mEq/L (ref 96–112)
Creatinine, Ser: 0.96 mg/dL (ref 0.40–1.50)
GFR: 79.47 mL/min (ref >60.00)
Glucose, Bld: 126 mg/dL — ABNORMAL HIGH (ref 70–99)
Phosphorus: 3.7 mg/dL (ref 2.3–4.6)
Potassium: 5 mEq/L (ref 3.5–5.1)
Sodium: 136 mEq/L (ref 135–145)

## 2023-01-24 LAB — LIPID PANEL
Cholesterol: 177 mg/dL (ref 0–200)
HDL: 59.6 mg/dL (ref >39.00)
LDL Cholesterol: 97 mg/dL (ref 0–99)
NonHDL: 117.07
Total CHOL/HDL Ratio: 3
Triglycerides: 100 mg/dL (ref 0.0–149.0)
VLDL: 20 mg/dL (ref 0.0–40.0)

## 2023-01-24 LAB — MICROALBUMIN / CREATININE URINE RATIO
Creatinine,U: 115.6 mg/dL
Microalb Creat Ratio: 2.1 mg/g (ref 0.0–30.0)
Microalb, Ur: 2.4 mg/dL — ABNORMAL HIGH (ref 0.0–1.9)

## 2023-01-24 LAB — VITAMIN B12: Vitamin B-12: 602 pg/mL (ref 211–911)

## 2023-01-24 LAB — CBC
HCT: 42.5 % (ref 39.0–52.0)
Hemoglobin: 13.7 g/dL (ref 13.0–17.0)
MCHC: 32.3 g/dL (ref 30.0–36.0)
MCV: 90 fl (ref 78.0–100.0)
Platelets: 285 10*3/uL (ref 150.0–400.0)
RBC: 4.72 Mil/uL (ref 4.22–5.81)
RDW: 14.4 % (ref 11.5–15.5)
WBC: 4.2 10*3/uL (ref 4.0–10.5)

## 2023-01-24 LAB — HEMOGLOBIN A1C: Hgb A1c MFr Bld: 6.2 % (ref 4.6–6.5)

## 2023-01-24 LAB — HM DIABETES FOOT EXAM

## 2023-01-24 LAB — TSH: TSH: 0.97 u[IU]/mL (ref 0.35–5.50)

## 2023-01-24 MED ORDER — SILDENAFIL CITRATE 20 MG PO TABS: 60.0000 mg | ORAL_TABLET | Freq: Every day | ORAL | 11 refills | Status: DC | PRN

## 2023-01-24 NOTE — Assessment & Plan Note (Signed)
BP Readings from Last 3 Encounters:  01/24/23 122/60  07/05/22 138/78  06/30/22 137/73   Good control on amlodipine 5mg  and valsartan/HCT 320/25

## 2023-01-24 NOTE — Assessment & Plan Note (Signed)
Will need TKR eventually Uses analgesics prn

## 2023-01-24 NOTE — Assessment & Plan Note (Signed)
I have personally reviewed the Medicare Annual Wellness questionnaire and have noted 1. The patient's medical and social history 2. Their use of alcohol, tobacco or illicit drugs 3. Their current medications and supplements 4. The patient's functional ability including ADL's, fall risks, home safety risks and hearing or visual             impairment. 5. Diet and physical activities 6. Evidence for depression or mood disorders  The patients weight, height, BMI and visual acuity have been recorded in the chart I have made referrals, counseling and provided education to the patient based review of the above and I have provided the pt with a written personalized care plan for preventive services.  I have provided you with a copy of your personalized plan for preventive services. Please take the time to review along with your updated medication list.  Done with cancer screening Discussed exercise--especially quad strengthening Can consider shingrix at pharmacy Updated COVID/flu vaccines in the fall--and one time RSV

## 2023-01-24 NOTE — Assessment & Plan Note (Signed)
Mildly concerning Will check labs Consider MRI if worsens

## 2023-01-24 NOTE — Progress Notes (Signed)
Hearing Screening - Comments:: Passed whisper test Vision Screening - Comments:: February 2024  

## 2023-01-24 NOTE — Progress Notes (Signed)
Subjective:    Patient ID: Ricardo Reese, male    DOB: 17-Jan-1951, 72 y.o.   MRN: [id number]  HPI Here for Medicare wellness visit and follow up of chronic health conditions--with wife Reviewed advanced directives Reviewed other doctors---[facility] vision, Dr Eskridge-urology, Dr Lanny Cramp, Dr Brennan Bailey, Dr Patel--neurology, Dr Zada Girt , Dr Benjamin Stain No hospitalizations or surgery in the past year--does have upcoming SCC excision Vision is okay Hearing is fine Some beer No tobacco  Not really exercising--does play golf (rides)---trouble with knee pain No falls No depression or anhedonia Independent with instrumental ADLs  Wife has some concerns about his memory About 2 years ago--he couldn't find his way to bathroom in daughter's house Now has it straight Did get messed up in other daughter's house--but other times hasn't Wife notes that he leaves doors open--car, cabinets, etc Once left his golf clubs out when unpacking from trip (with trunk and car door open) Wife noted him losing spot of parked car once as well--while wife visiting with her mom in SNF--she had to call him while watching him from the window No problems at work. Wife pays the bills  Wife concerned he skips meals frequently--not new Will get moody sometimes after that  Has been in PT --dropped foot and then for arthritis Will eventually need right TKR/THR Got one injection Rarely uses analgesics  Not checking sugars No apparent problems  No chest pain or SOB No dizziness or syncope No palpitations No edema  Sees Eskridge yearly PSA has been okay Had RT Urinary stream okay--no incontinence  Current Outpatient Medications on File Prior to Visit  Medication Sig Dispense Refill   amLODipine (NORVASC) 5 MG tablet TAKE 1 TABLET BY MOUTH EVERY DAY 90 tablet 3   ammonium lactate (AMLACTIN) 12 % lotion Apply 1 application topically as needed for dry skin. 400 g 1   B Complex-C (SUPER B  COMPLEX PO) Take 1 tablet by mouth daily.      fish oil-omega-3 fatty acids 1000 MG capsule Take 1 g by mouth daily.     metFORMIN (GLUCOPHAGE) 500 MG tablet TAKE 1 TABLET BY MOUTH TWICE A DAY WITH FOOD 180 tablet 0   pravastatin (PRAVACHOL) 40 MG tablet TAKE 1 TABLET BY MOUTH EVERY DAY 90 tablet 3   sildenafil (REVATIO) 20 MG tablet Take 3-5 tablets (60-100 mg total) by mouth daily as needed. 50 tablet 11   valsartan-hydrochlorothiazide (DIOVAN-HCT) 320-25 MG tablet TAKE 1 TABLET BY MOUTH EVERY DAY 90 tablet 3   No current facility-administered medications on file prior to visit.    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    Past Surgical History:  Procedure Laterality Date   COLONOSCOPY     KNEE SURGERY  1969   cartilage removal right knee    melanoma back  09/2010   PROSTATE BIOPSY     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   Procedure: LEFT TOTAL HIP  ARTHROPLASTY ANTERIOR APPROACH;  Surgeon: Kathryne Hitch, MD;  Location: [facility] OR;  Service: Orthopedics;  Laterality: Left;    Family History  Problem Relation Age of Onset   Heart attack Mother    Stroke Father    Lung cancer Father    Prostate cancer Brother    Heart disease Brother    Diabetes Neg Hx    Colon cancer Neg Hx    Stomach cancer Neg Hx    Esophageal cancer Neg Hx    Rectal cancer Neg Hx    Colon polyps Neg Hx     Social History   Socioeconomic History   Marital status: Married    Spouse name: [name]   Number of children: 2   Years of education: Not on file    Highest education level: Not on file  Occupational History   Occupation: Company secretary: [employer]    Comment: Retired  Tobacco Use   Smoking status: Never    Passive exposure: Past   Smokeless tobacco: Never  Vaping Use   Vaping status: Never Used  Substance and Sexual Activity   Alcohol use: Yes    Alcohol/week: 3.0 - 4.0 standard drinks of alcohol    Types: 3 - 4 Cans of beer per week    Comment: occasional beer   Drug use: No   Sexual activity: Yes  Other Topics Concern   Not on file  Social History Narrative   Has living will   Wife is health care POA--- daughters next   Would accept resuscitation   No tube feeds if cognitively unaware   Lives with wife   retired      Right Handed    Lives in a two story home    Social Determinants of Health   Financial Resource Strain: Low Risk  (02/03/2021)   Overall Financial Resource Strain (CARDIA)    Difficulty of Paying Living Expenses: Not hard at all  Food Insecurity: Not on file  Transportation Needs: No Transportation Needs (07/18/2018)   PRAPARE - Administrator, Civil Service (Medical): No    Lack of Transportation (Non-Medical): No  Physical Activity: Not on file  Stress: Not on file  Social Connections: Not on file  Intimate Partner Violence: Not on file   Review of Systems Appetite is fine Has lost some weight Sleeps okay Wears seat belt Teeth are fine--keeps up with dentist No heartburn or dysphagia Bowels move fine--no blood No new skin concerns     Objective:   Physical Exam Constitutional:      Appearance: Normal appearance.  HENT:     Mouth/Throat:     Pharynx: No oropharyngeal exudate or posterior oropharyngeal erythema.  Eyes:     Conjunctiva/sclera: Conjunctivae normal.     Pupils: Pupils are equal, round, and reactive to light.  Cardiovascular:     Rate and Rhythm: Normal rate and regular rhythm.     Pulses: Normal pulses.     Heart sounds:     No gallop.      Comments: Soft aortic systolic murmur Pulmonary:     Effort: Pulmonary effort is normal.     Breath sounds: Normal breath sounds. No wheezing or rales.  Abdominal:     Palpations: Abdomen is soft.     Tenderness: There is no abdominal tenderness.  Musculoskeletal:     Cervical back: Neck supple.     Right lower leg: No edema.     Left lower  leg: No edema.  Lymphadenopathy:     Cervical: No cervical adenopathy.  Skin:    Findings: No lesion or rash.  Neurological:     General: No focal deficit present.     Mental Status: He is alert and oriented to person, place, and time.     Comments: Word naming--14/1 minute Recall-- 2/3 Fairly normal sensation in feet  Psychiatric:        Mood and Affect: Mood normal.        Behavior: Behavior normal.            Assessment & Plan:

## 2023-01-24 NOTE — Assessment & Plan Note (Signed)
Seems to have good control On metformin 500 bid

## 2023-02-15 DIAGNOSIS — D0471 Carcinoma in situ of skin of right lower limb, including hip: Secondary | ICD-10-CM | POA: Diagnosis not present

## 2023-05-29 ENCOUNTER — Ambulatory Visit (INDEPENDENT_AMBULATORY_CARE_PROVIDER_SITE_OTHER): Payer: Medicare Other | Admitting: Orthopaedic Surgery

## 2023-05-29 VITALS — Ht 70.08 in | Wt 206.2 lb

## 2023-05-29 DIAGNOSIS — M25561 Pain in right knee: Secondary | ICD-10-CM | POA: Diagnosis not present

## 2023-05-29 DIAGNOSIS — G8929 Other chronic pain: Secondary | ICD-10-CM

## 2023-05-29 DIAGNOSIS — M1711 Unilateral primary osteoarthritis, right knee: Secondary | ICD-10-CM | POA: Insufficient documentation

## 2023-05-29 NOTE — Progress Notes (Signed)
The patient is a 72 year old gentleman we have seen for a long period of time.  We have actually replaced his left hip.  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  He had cartilage type of surgery back in 1969.  His knee is slowly worsening since then in terms of right knee pain that is detrimentally affecting his mobility, his quality of life and his activities day living.  We have injected his knee with steroid and hyaluronic acid.  He gets a sharp pain and a clicking sensation with his right knee.  He is interested in knee replacement surgery at this standpoint.  He is a diabetic but that is under good control.  He is not obese.  He is not a smoker.  I was able to review all of his past medical history and medications within epic and again we have replaced his left hip before.  Examination of his right knee shows significant varus malalignment.  There is no effusion today.  There is a well-healed medial incision.  He has significant patellofemoral crepitation throughout arc of motion of that knee and the varus malalignment is not fully correctable.  There is a slight flexion contracture as well.  Previous x-rays of the right knee show severe end-stage arthritis with bone-on-bone wear of the medial compartment and patellofemoral joint.  There are osteophytes in all 3 compartments.  At this point given the failure of conservative treatment for over a year combined with his x-ray findings and continued pain and signs and symptoms as well as clinical exam findings, we are recommending a knee replacement.  We described in detail what the surgery involves.  We discussed the risks and benefits of the surgery and what to expect from an intraoperative and postoperative standpoint.  All questions and concerns were addressed and answered.  We will work on getting this scheduled.

## 2023-06-22 NOTE — Progress Notes (Signed)
 Surgical Instructions   Your procedure is scheduled on Tuesday, January 14th, 2025. Report to [facility] Main Entrance A at 6:45 A.M., then check in with the Admitting office. Any questions or running late day of surgery: call [phone]  Questions prior to your surgery date: call [phone], Monday-Friday, 8am-4pm. If you experience any cold or flu symptoms such as cough, fever, chills, shortness of breath, etc. between now and your scheduled surgery, please notify us  at the above number.     Remember:  Do not eat after midnight the night before your surgery  You may drink clear liquids until 5:45 the morning of your surgery.   Clear liquids allowed are: Water, Non-Citrus Juices (without pulp), Carbonated Beverages, Clear Tea (no milk, honey, etc.), Black Coffee Only (NO MILK, CREAM OR POWDERED CREAMER of any kind), and Gatorade.  Patient Instructions  The night before surgery:  No food after midnight. ONLY clear liquids after midnight   The day of surgery (if you have diabetes): Drink ONE (1) 12 oz G2 given to you in your pre admission testing appointment by 5:45 the morning of surgery. Drink in one sitting. Do not sip.  This drink was given to you during your hospital  pre-op appointment visit.  Nothing else to drink after completing the  12 oz bottle of G2.         If you have questions, please contact your surgeon's office.     Take these medicines the morning of surgery with A SIP OF WATER: None.     May take these medicines IF NEEDED: None.   WHAT DO I DO ABOUT MY DIABETES MEDICATION?   Do not take oral diabetes medicines (pills) the morning of surgery.  Do NOT take Metformin  the morning of surgery.      HOW TO MANAGE YOUR DIABETES BEFORE AND AFTER SURGERY  Why is it important to control my blood sugar before and after surgery? Improving blood sugar levels before and after surgery helps healing and can limit problems. A way of improving blood sugar  control is eating a healthy diet by:  Eating less sugar and carbohydrates  Increasing activity/exercise  Talking with your doctor about reaching your blood sugar goals High blood sugars (greater than 180 mg/dL) can raise your risk of infections and slow your recovery, so you will need to focus on controlling your diabetes during the weeks before surgery. Make sure that the doctor who takes care of your diabetes knows about your planned surgery including the date and location.  How do I manage my blood sugar before surgery? Check your blood sugar at least 4 times a day, starting 2 days before surgery, to make sure that the level is not too high or low.  Check your blood sugar the morning of your surgery when you wake up and every 2 hours until you get to the Short Stay unit.  If your blood sugar is less than 70 mg/dL, you will need to treat for low blood sugar: Do not take insulin . Treat a low blood sugar (less than 70 mg/dL) with  cup of clear juice (cranberry or apple), 4 glucose tablets, OR glucose gel. Recheck blood sugar in 15 minutes after treatment (to make sure it is greater than 70 mg/dL). If your blood sugar is not greater than 70 mg/dL on recheck, call [phone] for further instructions. Report your blood sugar to the short stay nurse when you get to Short Stay.  If you are admitted to the hospital  after surgery: Your blood sugar will be checked by the staff and you will probably be given insulin  after surgery (instead of oral diabetes medicines) to make sure you have good blood sugar levels. The goal for blood sugar control after surgery is 80-180 mg/dL.     One week prior to surgery, STOP taking any Aspirin  (unless otherwise instructed by your surgeon) Aleve, Naproxen, Ibuprofen, Motrin, Advil, Goody's, BC's, all herbal medications, fish oil, and non-prescription vitamins.                     Do NOT Smoke (Tobacco/Vaping) for 24 hours prior to your procedure.  If you use  a CPAP at night, you may bring your mask/headgear for your overnight stay.   You will be asked to remove any contacts, glasses, piercing's, hearing aid's, dentures/partials prior to surgery. Please bring cases for these items if needed.    Patients discharged the day of surgery will not be allowed to drive home, and someone needs to stay with them for 24 hours.  SURGICAL WAITING ROOM VISITATION Patients may have no more than 2 support people in the waiting area - these visitors may rotate.   Pre-op nurse will coordinate an appropriate time for 1 ADULT support person, who may not rotate, to accompany patient in pre-op.  Children under the age of 47 must have an adult with them who is not the patient and must remain in the main waiting area with an adult.  If the patient needs to stay at the hospital during part of their recovery, the visitor guidelines for inpatient rooms apply.  Please refer to the [facility] website for the visitor guidelines for any additional information.   If you received a COVID test during your pre-op visit  it is requested that you wear a mask when out in public, stay away from anyone that may not be feeling well and notify your surgeon if you develop symptoms. If you have been in contact with anyone that has tested positive in the last 10 days please notify you surgeon.      Pre-operative 5 CHG Bathing Instructions   You can play a key role in reducing the risk of infection after surgery. Your skin needs to be as free of germs as possible. You can reduce the number of germs on your skin by washing with CHG (chlorhexidine  gluconate) soap before surgery. CHG is an antiseptic soap that kills germs and continues to kill germs even after washing.   DO NOT use if you have an allergy to chlorhexidine /CHG or antibacterial soaps. If your skin becomes reddened or irritated, stop using the CHG and notify one of our RNs at [phone].   Please shower with the CHG soap  starting 4 days before surgery using the following schedule:     Please keep in mind the following:  DO NOT shave, including legs and underarms, starting the day of your first shower.   You may shave your face at any point before/day of surgery.  Place clean sheets on your bed the day you start using CHG soap. Use a clean washcloth (not used since being washed) for each shower. DO NOT sleep with pets once you start using the CHG.   CHG Shower Instructions:  Wash your face and private area with normal soap. If you choose to wash your hair, wash first with your normal shampoo.  After you use shampoo/soap, rinse your hair and body thoroughly to remove shampoo/soap residue.  Turn the water OFF and apply about 3 tablespoons (45 ml) of CHG soap to a CLEAN washcloth.  Apply CHG soap ONLY FROM YOUR NECK DOWN TO YOUR TOES (washing for 3-5 minutes)  DO NOT use CHG soap on face, private areas, open wounds, or sores.  Pay special attention to the area where your surgery is being performed.  If you are having back surgery, having someone wash your back for you may be helpful. Wait 2 minutes after CHG soap is applied, then you may rinse off the CHG soap.  Pat dry with a clean towel  Put on clean clothes/pajamas   If you choose to wear lotion, please use ONLY the CHG-compatible lotions that are listed below.  Additional instructions for the day of surgery: DO NOT APPLY any lotions, deodorants, cologne, or perfumes.   Do not bring valuables to the hospital. [facility] is not responsible for any belongings/valuables. Do not wear nail polish, gel polish, artificial nails, or any other type of covering on natural nails (fingers and toes) Do not wear jewelry or makeup Put on clean/comfortable clothes.  Please brush your teeth.  Ask your nurse before applying any prescription medications to the skin.     CHG Compatible Lotions   Aveeno Moisturizing lotion  Cetaphil Moisturizing Cream  Cetaphil  Moisturizing Lotion  Clairol Herbal Essence Moisturizing Lotion, Dry Skin  Clairol Herbal Essence Moisturizing Lotion, Extra Dry Skin  Clairol Herbal Essence Moisturizing Lotion, Normal Skin  Curel Age Defying Therapeutic Moisturizing Lotion with Alpha Hydroxy  Curel Extreme Care Body Lotion  Curel Soothing Hands Moisturizing Hand Lotion  Curel Therapeutic Moisturizing Cream, Fragrance-Free  Curel Therapeutic Moisturizing Lotion, Fragrance-Free  Curel Therapeutic Moisturizing Lotion, Original Formula  Eucerin Daily Replenishing Lotion  Eucerin Dry Skin Therapy Plus Alpha Hydroxy Crme  Eucerin Dry Skin Therapy Plus Alpha Hydroxy Lotion  Eucerin Original Crme  Eucerin Original Lotion  Eucerin Plus Crme Eucerin Plus Lotion  Eucerin TriLipid Replenishing Lotion  Keri Anti-Bacterial Hand Lotion  Keri Deep Conditioning Original Lotion Dry Skin Formula Softly Scented  Keri Deep Conditioning Original Lotion, Fragrance Free Sensitive Skin Formula  Keri Lotion Fast Absorbing Fragrance Free Sensitive Skin Formula  Keri Lotion Fast Absorbing Softly Scented Dry Skin Formula  Keri Original Lotion  Keri Skin Renewal Lotion Keri Silky Smooth Lotion  Keri Silky Smooth Sensitive Skin Lotion  Nivea Body Creamy Conditioning Oil  Nivea Body Extra Enriched Lotion  Nivea Body Original Lotion  Nivea Body Sheer Moisturizing Lotion Nivea Crme  Nivea Skin Firming Lotion  NutraDerm 30 Skin Lotion  NutraDerm Skin Lotion  NutraDerm Therapeutic Skin Cream  NutraDerm Therapeutic Skin Lotion  ProShield Protective Hand Cream  Provon moisturizing lotion  Please read over the following fact sheets that you were given.

## 2023-06-23 ENCOUNTER — Encounter (HOSPITAL_COMMUNITY): Payer: Self-pay

## 2023-06-23 ENCOUNTER — Other Ambulatory Visit: Payer: Self-pay

## 2023-06-23 ENCOUNTER — Encounter (HOSPITAL_COMMUNITY)
Admission: RE | Admit: 2023-06-23 | Discharge: 2023-06-23 | Disposition: A | Discharge status: Home / Self Care | Payer: Medicare Other | Source: Ambulatory Visit | Attending: Orthopaedic Surgery | Admitting: Orthopaedic Surgery

## 2023-06-23 VITALS — BP 148/57 | HR 51 | Temp 97.7°F | Resp 18 | Ht 70.0 in | Wt 204.4 lb

## 2023-06-23 DIAGNOSIS — R001 Bradycardia, unspecified: Secondary | ICD-10-CM | POA: Insufficient documentation

## 2023-06-23 DIAGNOSIS — Z01818 Encounter for other preprocedural examination: Secondary | ICD-10-CM | POA: Diagnosis not present

## 2023-06-23 DIAGNOSIS — I498 Other specified cardiac arrhythmias: Secondary | ICD-10-CM | POA: Insufficient documentation

## 2023-06-23 DIAGNOSIS — E1159 Type 2 diabetes mellitus with other circulatory complications: Secondary | ICD-10-CM | POA: Diagnosis not present

## 2023-06-23 DIAGNOSIS — M1711 Unilateral primary osteoarthritis, right knee: Secondary | ICD-10-CM | POA: Insufficient documentation

## 2023-06-23 LAB — CBC
HCT: 41.5 % (ref 39.0–52.0)
Hemoglobin: 13.9 g/dL (ref 13.0–17.0)
MCH: 29.4 pg (ref 26.0–34.0)
MCHC: 33.5 g/dL (ref 30.0–36.0)
MCV: 87.7 fL (ref 80.0–100.0)
Platelets: 344 10*3/uL (ref 150–400)
RBC: 4.73 MIL/uL (ref 4.22–5.81)
RDW: 13.2 % (ref 11.5–15.5)
WBC: 5.9 10*3/uL (ref 4.0–10.5)
nRBC: 0 % (ref 0.0–0.2)

## 2023-06-23 LAB — BASIC METABOLIC PANEL
Anion gap: 9 (ref 5–15)
BUN: 16 mg/dL (ref 8–23)
CO2: 25 mmol/L (ref 22–32)
Calcium: 9.3 mg/dL (ref 8.9–10.3)
Chloride: 103 mmol/L (ref 98–111)
Creatinine, Ser: 1.02 mg/dL (ref 0.61–1.24)
GFR, Estimated: >60 mL/min (ref >60)
Glucose, Bld: 113 mg/dL — ABNORMAL HIGH (ref 70–99)
Potassium: 4.6 mmol/L (ref 3.5–5.1)
Sodium: 137 mmol/L (ref 135–145)

## 2023-06-23 LAB — GLUCOSE, CAPILLARY: Glucose-Capillary: 97 mg/dL (ref 70–99)

## 2023-06-23 LAB — HEMOGLOBIN A1C
Hgb A1c MFr Bld: 6.4 % — ABNORMAL HIGH (ref 4.8–5.6)
Mean Plasma Glucose: 136.98 mg/dL

## 2023-06-23 LAB — SURGICAL PCR SCREEN
MRSA, PCR: NEGATIVE
Staphylococcus aureus: NEGATIVE

## 2023-06-23 NOTE — Progress Notes (Signed)
 PCP - Charlie Denise Cardiologist - denies  PPM/ICD - denies Device Orders - n/a Rep Notified - n/a  Chest x-ray - denies EKG - 06/23/23 Stress Test - denies ECHO - denies Cardiac Cath - denies  Sleep Study - denies CPAP - n/a  Fasting Blood Sugar - unknown - patient does not check blood sugar at home  Last dose of GLP1 agonist- n/a   GLP1 instructions: n/a  Blood Thinner Instructions: n/a Aspirin  Instructions: n/a  ERAS Protcol - clears until 0545 G2- as ordered to complete by 0545  COVID TEST- n/a   Anesthesia review:  no  Patient denies shortness of breath, fever, cough and chest pain at PAT appointment   All instructions explained to the patient, with a verbal understanding of the material. Patient agrees to go over the instructions while at home for a better understanding. Patient also instructed to self quarantine after being tested for COVID-19. The opportunity to ask questions was provided.

## 2023-06-26 ENCOUNTER — Encounter (HOSPITAL_COMMUNITY): Payer: Self-pay | Admitting: Certified Registered"

## 2023-06-27 ENCOUNTER — Encounter: Admission: RE | Payer: Self-pay | Source: Home / Self Care

## 2023-06-27 ENCOUNTER — Ambulatory Visit (HOSPITAL_COMMUNITY): Admission: RE | Admit: 2023-06-27 | Payer: Medicare Other | Source: Home / Self Care | Admitting: Orthopaedic Surgery

## 2023-06-27 DIAGNOSIS — M1711 Unilateral primary osteoarthritis, right knee: Secondary | ICD-10-CM

## 2023-06-27 SURGERY — TOTAL KNEE ARTHROPLASTY
Anesthesia: Spinal | Site: Knee | Laterality: Right

## 2023-07-05 DIAGNOSIS — L570 Actinic keratosis: Secondary | ICD-10-CM | POA: Diagnosis not present

## 2023-07-05 DIAGNOSIS — D225 Melanocytic nevi of trunk: Secondary | ICD-10-CM | POA: Diagnosis not present

## 2023-07-05 DIAGNOSIS — D2272 Melanocytic nevi of left lower limb, including hip: Secondary | ICD-10-CM | POA: Diagnosis not present

## 2023-07-05 DIAGNOSIS — Z85828 Personal history of other malignant neoplasm of skin: Secondary | ICD-10-CM | POA: Diagnosis not present

## 2023-07-05 DIAGNOSIS — D2261 Melanocytic nevi of right upper limb, including shoulder: Secondary | ICD-10-CM | POA: Diagnosis not present

## 2023-07-05 DIAGNOSIS — D2271 Melanocytic nevi of right lower limb, including hip: Secondary | ICD-10-CM | POA: Diagnosis not present

## 2023-07-05 DIAGNOSIS — D2262 Melanocytic nevi of left upper limb, including shoulder: Secondary | ICD-10-CM | POA: Diagnosis not present

## 2023-07-05 DIAGNOSIS — L821 Other seborrheic keratosis: Secondary | ICD-10-CM | POA: Diagnosis not present

## 2023-07-05 DIAGNOSIS — Z85820 Personal history of malignant melanoma of skin: Secondary | ICD-10-CM | POA: Diagnosis not present

## 2023-07-10 ENCOUNTER — Encounter: Payer: Medicare Other | Admitting: Physician Assistant

## 2023-07-24 ENCOUNTER — Other Ambulatory Visit: Payer: Self-pay

## 2023-07-24 ENCOUNTER — Encounter (HOSPITAL_COMMUNITY): Payer: Self-pay | Admitting: Orthopaedic Surgery

## 2023-07-24 NOTE — H&P (Signed)
 TOTAL KNEE ADMISSION H&P  Patient is being admitted for right total knee arthroplasty.  Subjective:  Chief Complaint:right knee pain.  HPI: Ricardo Reese, 73 y.o. male, has a history of pain and functional disability in the right knee due to arthritis and has failed non-surgical conservative treatments for greater than 12 weeks to includeNSAID's and/or analgesics, corticosteriod injections, flexibility and strengthening excercises, weight reduction as appropriate, and activity modification.  Onset of symptoms was gradual, starting >10 years ago with gradually worsening course since that time. The patient noted prior procedures on the knee to include  open "cartilage surgery" in 1969  on the right knee(s).  Patient currently rates pain in the right knee(s) at 10 out of 10 with activity. Patient has night pain, worsening of pain with activity and weight bearing, pain that interferes with activities of daily living, pain with passive range of motion, crepitus, and joint swelling.  Patient has evidence of subchondral sclerosis, periarticular osteophytes, and joint space narrowing by imaging studies. There is no active infection.  Patient Active Problem List   Diagnosis Date Noted   Unilateral primary osteoarthritis, right knee 05/29/2023   Cognitive changes 01/24/2023   Left foot drop 12/31/2021   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   Right knee pain 10/25/2019   History of prostate cancer 01/02/2018   Advance directive discussed with patient 09/26/2016   Status post left hip replacement 09/20/2016   Routine general medical examination at a health care facility 08/17/2011   History of melanoma    Hyperlipemia 05/20/2008   Benign essential hypertension 05/20/2008   Type 2 diabetes mellitus with other circulatory complications (HCC) 05/20/2008   Past Medical History:  Diagnosis Date   Diabetes mellitus without complication (HCC)    History of kidney stones    has had twice   Hyperlipidemia     Hypertension    Impaired fasting glucose    Kidney stone 1983   Melanoma of back [facility]) 2013   Dr Tresa Frohlich   Pneumonia    walking back in 2002   Prostate cancer (HCC) 2019   Skin cancer 06/14/2011   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   Current Outpatient Medications  Medication Sig Dispense Refill Last Dose/Taking   amLODipine  (NORVASC ) 5 MG tablet TAKE 1 TABLET BY MOUTH EVERY DAY (Patient taking differently: Take 5 mg by mouth every evening.) 90 tablet 3 Taking Differently   ammonium lactate  (AMLACTIN) 12 % lotion Apply 1 application topically as needed for dry skin. 400 g 1 Taking As Needed   B Complex-C (SUPER B COMPLEX PO) Take 1 tablet by mouth in the morning.   Taking   fish oil-omega-3 fatty acids 1000 MG capsule Take 1 g by mouth in the morning.   Taking   metFORMIN  (GLUCOPHAGE ) 500 MG tablet TAKE 1 TABLET BY MOUTH TWICE A DAY WITH FOOD 180 tablet 0 Taking   Multiple Vitamin (MULTIVITAMIN WITH MINERALS) TABS tablet Take 1 tablet by mouth in the morning.  Taking   pravastatin  (PRAVACHOL ) 40 MG tablet TAKE 1 TABLET BY MOUTH EVERY DAY (Patient taking differently: Take 40 mg by mouth every evening.) 90 tablet 3 Taking Differently   sildenafil  (REVATIO ) 20 MG tablet Take 3-5 tablets (60-100 mg total) by mouth daily as needed. 50 tablet 11 Taking As Needed   valsartan -hydrochlorothiazide  (DIOVAN -HCT) 320-25 MG tablet TAKE 1 TABLET BY MOUTH EVERY DAY 90 tablet 3 Taking   No Known Allergies  Social History    Tobacco Use   Smoking status: Never    Passive exposure: Past   Smokeless tobacco: Never  Substance Use Topics   Alcohol use: Yes    Alcohol/week: 3.0 - 4.0 standard drinks of alcohol    Types: 3 - 4 Cans of beer per week    Comment: occasional beer    Family History  Problem Relation Age of Onset   Heart attack Mother    Stroke Father    Lung cancer Father    Prostate cancer Brother    Heart disease Brother    Diabetes Neg Hx    Colon cancer Neg Hx    Stomach cancer Neg Hx    Esophageal cancer Neg Hx    Rectal cancer Neg Hx    Colon polyps Neg Hx      Review of Systems  Objective:  Physical Exam Vitals reviewed.  Constitutional:      Appearance: Normal appearance.  HENT:     Head: Normocephalic and atraumatic.  Eyes:     Extraocular Movements: Extraocular movements intact.     Pupils: Pupils are equal, round, and reactive to light.  Cardiovascular:     Rate and Rhythm: Normal rate and regular rhythm.  Pulmonary:     Effort: Pulmonary effort is normal.     Breath sounds: Normal breath sounds.  Abdominal:     Palpations: Abdomen is soft.  Musculoskeletal:     Cervical back: Normal range of motion and neck supple.     Right knee: Effusion, bony tenderness and crepitus present. Decreased range of motion. Tenderness present over the medial joint line and lateral joint line. Abnormal alignment and abnormal meniscus.  Neurological:     Mental Status: He is alert and oriented to person, place, and time.  Psychiatric:        Behavior: Behavior normal.     Vital signs in last 24 hours:    Labs:   Estimated body mass index is 29.33 kg/m as calculated from the following:   Height as of 06/23/23: 5\' 10"  (1.778 m).   Weight as of 06/23/23: 92.7 kg.   Imaging Review Plain radiographs demonstrate severe degenerative joint disease of the right knee(s). The overall alignment ismild varus. The bone quality appears to be excellent for age and reported activity  level.      Assessment/Plan:  End stage arthritis, right knee   The patient history, physical examination, clinical judgment of the provider and imaging studies are consistent with end stage degenerative joint disease of the right knee(s) and total knee arthroplasty is deemed medically necessary. The treatment options including medical management, injection therapy arthroscopy and arthroplasty were discussed at length. The risks and benefits of total knee arthroplasty were presented and reviewed. The risks due to aseptic loosening, infection, stiffness, patella tracking problems, thromboembolic complications and other imponderables were discussed. The patient acknowledged the explanation, agreed to proceed with the plan and consent was signed. Patient is being admitted for inpatient treatment for surgery, pain control, PT, OT, prophylactic  antibiotics, VTE prophylaxis, progressive ambulation and ADL's and discharge planning. The patient is planning to be discharged home with home health services

## 2023-07-24 NOTE — Anesthesia Preprocedure Evaluation (Addendum)
Anesthesia Evaluation  Patient identified by MRN, date of birth, ID band Patient awake    Reviewed: Allergy & Precautions, NPO status , Patient's Chart, lab work & pertinent test results  History of Anesthesia Complications Negative for: history of anesthetic complications  Airway Mallampati: II  TM Distance: >3 FB Neck ROM: Full    Dental  (+) Dental Advisory Given   Pulmonary neg pulmonary ROS   breath sounds clear to auscultation       Cardiovascular hypertension, Pt. on medications (-) angina  Rhythm:Regular Rate:Normal  '21 ECHO: EF 60-65%, normal LVF, normal RVF, no significant valvular abnormalities   Neuro/Psych negative neurological ROS     GI/Hepatic negative GI ROS, Neg liver ROS,,,  Endo/Other  diabetes (glu 93), Oral Hypoglycemic Agents    Renal/GU negative Renal ROSH/o stones     Musculoskeletal  (+) Arthritis ,    Abdominal   Peds  Hematology negative hematology ROS (+)   Anesthesia Other Findings H/o prostate cancer  Reproductive/Obstetrics                             Anesthesia Physical Anesthesia Plan  ASA: 3  Anesthesia Plan: Spinal   Post-op Pain Management: Tylenol PO (pre-op)* and Regional block*   Induction:   PONV Risk Score and Plan: 1 and Treatment may vary due to age or medical condition  Airway Management Planned: Natural Airway and Simple Face Mask  Additional Equipment: None  Intra-op Plan:   Post-operative Plan:   Informed Consent: I have reviewed the patients History and Physical, chart, labs and discussed the procedure including the risks, benefits and alternatives for the proposed anesthesia with the patient or authorized representative who has indicated his/her understanding and acceptance.     Dental advisory given  Plan Discussed with: CRNA and Surgeon  Anesthesia Plan Comments: (Plan routine monitors, SAB with adductor canal block  for post op analgesia)        Anesthesia Quick Evaluation

## 2023-07-24 NOTE — Progress Notes (Signed)
 PCP - Curt Dover Cardiologist - denies   PPM/ICD - denies Device Orders - n/a Rep Notified - n/a   Chest x-ray - denies EKG - 06/23/23 Stress Test - denies ECHO - denies Cardiac Cath - denies   Sleep Study - denies CPAP - n/a   Fasting Blood Sugar - unknown - patient does not check blood sugar at home   Last dose of GLP1 agonist- n/a   GLP1 instructions: n/a   Blood Thinner Instructions: n/a Aspirin  Instructions: n/a   ERAS Protcol - clears until 0430 G2- as ordered to complete by 0430   COVID TEST- n/a     Anesthesia review:  no   Patient denies shortness of breath, fever, cough and chest pain at PAT appointment     All instructions explained to the patient, with a verbal understanding of the material. Patient agrees to go over the instructions while at home for a better understanding. Patient also instructed to self quarantine after being tested for COVID-19. The opportunity to ask questions was provided.  Patient's originally scheduled surgery was canceled due to a death in the family.  Patient reports no changes since he was seen in PAT.  Patient states that he is still clear on instructions.

## 2023-07-25 ENCOUNTER — Observation Stay (HOSPITAL_COMMUNITY): Payer: Self-pay | Admitting: Anesthesiology

## 2023-07-25 ENCOUNTER — Encounter (HOSPITAL_COMMUNITY): Payer: Self-pay | Admitting: Orthopaedic Surgery

## 2023-07-25 ENCOUNTER — Other Ambulatory Visit: Payer: Self-pay

## 2023-07-25 ENCOUNTER — Observation Stay (HOSPITAL_COMMUNITY)
Admission: RE | Admit: 2023-07-25 | Discharge: 2023-07-26 | Disposition: A | Discharge status: Home Health | Payer: Medicare Other | Attending: Orthopaedic Surgery | Admitting: Orthopaedic Surgery

## 2023-07-25 ENCOUNTER — Observation Stay (HOSPITAL_COMMUNITY): Payer: Medicare Other

## 2023-07-25 ENCOUNTER — Encounter (HOSPITAL_COMMUNITY)
Admission: RE | Disposition: A | Discharge status: Home Health | Payer: Self-pay | Source: Home / Self Care | Attending: Orthopaedic Surgery

## 2023-07-25 DIAGNOSIS — Z471 Aftercare following joint replacement surgery: Secondary | ICD-10-CM | POA: Diagnosis not present

## 2023-07-25 DIAGNOSIS — Z7984 Long term (current) use of oral hypoglycemic drugs: Secondary | ICD-10-CM | POA: Diagnosis not present

## 2023-07-25 DIAGNOSIS — G8918 Other acute postprocedural pain: Secondary | ICD-10-CM | POA: Diagnosis not present

## 2023-07-25 DIAGNOSIS — Z96642 Presence of left artificial hip joint: Secondary | ICD-10-CM | POA: Insufficient documentation

## 2023-07-25 DIAGNOSIS — R609 Edema, unspecified: Secondary | ICD-10-CM | POA: Diagnosis not present

## 2023-07-25 DIAGNOSIS — Z85828 Personal history of other malignant neoplasm of skin: Secondary | ICD-10-CM | POA: Insufficient documentation

## 2023-07-25 DIAGNOSIS — M179 Osteoarthritis of knee, unspecified: Secondary | ICD-10-CM | POA: Diagnosis present

## 2023-07-25 DIAGNOSIS — M1711 Unilateral primary osteoarthritis, right knee: Secondary | ICD-10-CM

## 2023-07-25 DIAGNOSIS — I10 Essential (primary) hypertension: Secondary | ICD-10-CM | POA: Insufficient documentation

## 2023-07-25 DIAGNOSIS — Z8546 Personal history of malignant neoplasm of prostate: Secondary | ICD-10-CM | POA: Diagnosis not present

## 2023-07-25 DIAGNOSIS — E119 Type 2 diabetes mellitus without complications: Secondary | ICD-10-CM

## 2023-07-25 DIAGNOSIS — Z96651 Presence of right artificial knee joint: Secondary | ICD-10-CM | POA: Diagnosis not present

## 2023-07-25 DIAGNOSIS — Z79899 Other long term (current) drug therapy: Secondary | ICD-10-CM | POA: Insufficient documentation

## 2023-07-25 HISTORY — PX: TOTAL KNEE ARTHROPLASTY: SHX125

## 2023-07-25 LAB — CBC
HCT: 42.7 % (ref 39.0–52.0)
Hemoglobin: 14.1 g/dL (ref 13.0–17.0)
MCH: 29.6 pg (ref 26.0–34.0)
MCHC: 33 g/dL (ref 30.0–36.0)
MCV: 89.5 fL (ref 80.0–100.0)
Platelets: 318 10*3/uL (ref 150–400)
RBC: 4.77 MIL/uL (ref 4.22–5.81)
RDW: 13.9 % (ref 11.5–15.5)
WBC: 6.3 10*3/uL (ref 4.0–10.5)
nRBC: 0 % (ref 0.0–0.2)

## 2023-07-25 LAB — GLUCOSE, CAPILLARY
Glucose-Capillary: 93 mg/dL (ref 70–99)
Glucose-Capillary: 105 mg/dL — ABNORMAL HIGH (ref 70–99)
Glucose-Capillary: 142 mg/dL — ABNORMAL HIGH (ref 70–99)
Glucose-Capillary: 144 mg/dL — ABNORMAL HIGH (ref 70–99)

## 2023-07-25 LAB — SURGICAL PCR SCREEN
MRSA, PCR: NEGATIVE
Staphylococcus aureus: NEGATIVE

## 2023-07-25 LAB — BASIC METABOLIC PANEL
Anion gap: 9 (ref 5–15)
BUN: 23 mg/dL (ref 8–23)
CO2: 25 mmol/L (ref 22–32)
Calcium: 9.6 mg/dL (ref 8.9–10.3)
Chloride: 103 mmol/L (ref 98–111)
Creatinine, Ser: 1.05 mg/dL (ref 0.61–1.24)
GFR, Estimated: >60 mL/min (ref >60)
Glucose, Bld: 92 mg/dL (ref 70–99)
Potassium: 4.8 mmol/L (ref 3.5–5.1)
Sodium: 137 mmol/L (ref 135–145)

## 2023-07-25 SURGERY — ARTHROPLASTY, KNEE, TOTAL
Anesthesia: Spinal | Site: Knee | Laterality: Right

## 2023-07-25 MED ORDER — CHLORHEXIDINE GLUCONATE 0.12 % MT SOLN
15.0000 mL | Freq: Once | OROMUCOSAL | Status: AC
  Administered 2023-07-25: 15 mL via OROMUCOSAL
  Filled 2023-07-25: qty 15

## 2023-07-25 MED ORDER — POLYETHYLENE GLYCOL 3350 17 G PO PACK: 17.0000 g | PACK | Freq: Every day | ORAL | Status: DC | PRN

## 2023-07-25 MED ORDER — LIDOCAINE 2% (20 MG/ML) 5 ML SYRINGE
INTRAMUSCULAR | Status: AC
  Filled 2023-07-25: qty 5

## 2023-07-25 MED ORDER — ONDANSETRON HCL 4 MG PO TABS: 4.0000 mg | ORAL_TABLET | Freq: Four times a day (QID) | ORAL | Status: DC | PRN

## 2023-07-25 MED ORDER — MIDAZOLAM HCL 2 MG/2ML IJ SOLN
INTRAMUSCULAR | Status: DC | PRN
  Administered 2023-07-25 (×2): 1 mg via INTRAVENOUS

## 2023-07-25 MED ORDER — METHOCARBAMOL 500 MG PO TABS
500.0000 mg | ORAL_TABLET | Freq: Four times a day (QID) | ORAL | Status: DC | PRN
  Administered 2023-07-25 – 2023-07-26 (×3): 500 mg via ORAL
  Filled 2023-07-25 (×3): qty 1

## 2023-07-25 MED ORDER — DIPHENHYDRAMINE HCL 12.5 MG/5ML PO ELIX: 12.5000 mg | ORAL_SOLUTION | ORAL | Status: DC | PRN

## 2023-07-25 MED ORDER — AMLODIPINE BESYLATE 5 MG PO TABS
5.0000 mg | ORAL_TABLET | Freq: Every evening | ORAL | Status: DC
  Administered 2023-07-25: 5 mg via ORAL
  Filled 2023-07-25: qty 1

## 2023-07-25 MED ORDER — ONDANSETRON HCL 4 MG/2ML IJ SOLN
INTRAMUSCULAR | Status: DC | PRN
  Administered 2023-07-25: 4 mg via INTRAVENOUS

## 2023-07-25 MED ORDER — ALBUMIN HUMAN 5 % IV SOLN
INTRAVENOUS | Status: AC
  Filled 2023-07-25: qty 250

## 2023-07-25 MED ORDER — OXYCODONE HCL 5 MG PO TABS
5.0000 mg | ORAL_TABLET | ORAL | Status: DC | PRN
  Administered 2023-07-25 – 2023-07-26 (×6): 10 mg via ORAL
  Filled 2023-07-25 (×6): qty 2

## 2023-07-25 MED ORDER — DOCUSATE SODIUM 100 MG PO CAPS
100.0000 mg | ORAL_CAPSULE | Freq: Two times a day (BID) | ORAL | Status: DC
  Administered 2023-07-25 – 2023-07-26 (×3): 100 mg via ORAL
  Filled 2023-07-25 (×3): qty 1

## 2023-07-25 MED ORDER — BUPIVACAINE IN DEXTROSE 0.75-8.25 % IT SOLN
INTRATHECAL | Status: DC | PRN
  Administered 2023-07-25: 12 mg via INTRATHECAL

## 2023-07-25 MED ORDER — METOCLOPRAMIDE HCL 5 MG PO TABS: 5.0000 mg | ORAL_TABLET | Freq: Three times a day (TID) | ORAL | Status: DC | PRN

## 2023-07-25 MED ORDER — ROPIVACAINE HCL 7.5 MG/ML IJ SOLN
INTRAMUSCULAR | Status: DC | PRN
  Administered 2023-07-25: 20 mL via PERINEURAL

## 2023-07-25 MED ORDER — OXYCODONE HCL 5 MG PO TABS: 5.0000 mg | ORAL_TABLET | Freq: Once | ORAL | Status: DC | PRN

## 2023-07-25 MED ORDER — METOCLOPRAMIDE HCL 5 MG/ML IJ SOLN: 5.0000 mg | Freq: Three times a day (TID) | INTRAMUSCULAR | Status: DC | PRN

## 2023-07-25 MED ORDER — METHOCARBAMOL 1000 MG/10ML IJ SOLN: 500.0000 mg | Freq: Four times a day (QID) | INTRAMUSCULAR | Status: DC | PRN

## 2023-07-25 MED ORDER — ACETAMINOPHEN 325 MG PO TABS: 325.0000 mg | ORAL_TABLET | Freq: Four times a day (QID) | ORAL | Status: DC | PRN

## 2023-07-25 MED ORDER — ALBUMIN HUMAN 5 % IV SOLN
12.5000 g | Freq: Once | INTRAVENOUS | Status: AC
  Administered 2023-07-25: 12.5 g via INTRAVENOUS

## 2023-07-25 MED ORDER — FENTANYL CITRATE (PF) 100 MCG/2ML IJ SOLN
INTRAMUSCULAR | Status: AC
  Filled 2023-07-25: qty 2

## 2023-07-25 MED ORDER — CEFAZOLIN SODIUM-DEXTROSE 2-4 GM/100ML-% IV SOLN
2.0000 g | INTRAVENOUS | Status: AC
  Administered 2023-07-25: 2 g via INTRAVENOUS
  Filled 2023-07-25: qty 100

## 2023-07-25 MED ORDER — HYDROCHLOROTHIAZIDE 25 MG PO TABS
25.0000 mg | ORAL_TABLET | Freq: Every day | ORAL | Status: DC
  Administered 2023-07-25 – 2023-07-26 (×2): 25 mg via ORAL
  Filled 2023-07-25 (×2): qty 1

## 2023-07-25 MED ORDER — POVIDONE-IODINE 10 % EX SWAB
2.0000 | Freq: Once | CUTANEOUS | Status: AC
  Administered 2023-07-25: 2 via TOPICAL

## 2023-07-25 MED ORDER — ONDANSETRON HCL 4 MG/2ML IJ SOLN: 4.0000 mg | Freq: Four times a day (QID) | INTRAMUSCULAR | Status: DC | PRN

## 2023-07-25 MED ORDER — INSULIN ASPART 100 UNIT/ML IJ SOLN: 0.0000 [IU] | INTRAMUSCULAR | Status: DC | PRN

## 2023-07-25 MED ORDER — TRANEXAMIC ACID-NACL 1000-0.7 MG/100ML-% IV SOLN
1000.0000 mg | INTRAVENOUS | Status: AC
  Administered 2023-07-25: 1000 mg via INTRAVENOUS
  Filled 2023-07-25: qty 100

## 2023-07-25 MED ORDER — FENTANYL CITRATE (PF) 100 MCG/2ML IJ SOLN
INTRAMUSCULAR | Status: DC | PRN
  Administered 2023-07-25 (×2): 50 ug via INTRAVENOUS

## 2023-07-25 MED ORDER — 0.9 % SODIUM CHLORIDE (POUR BTL) OPTIME
TOPICAL | Status: DC | PRN
  Administered 2023-07-25: 1000 mL

## 2023-07-25 MED ORDER — MEPERIDINE HCL 25 MG/ML IJ SOLN: 6.2500 mg | INTRAMUSCULAR | Status: DC | PRN

## 2023-07-25 MED ORDER — ONDANSETRON HCL 4 MG/2ML IJ SOLN
INTRAMUSCULAR | Status: AC
  Filled 2023-07-25: qty 2

## 2023-07-25 MED ORDER — PHENYLEPHRINE HCL-NACL 20-0.9 MG/250ML-% IV SOLN
INTRAVENOUS | Status: DC | PRN
  Administered 2023-07-25: 30 ug/min via INTRAVENOUS

## 2023-07-25 MED ORDER — ASPIRIN 81 MG PO CHEW
81.0000 mg | CHEWABLE_TABLET | Freq: Two times a day (BID) | ORAL | Status: DC
  Administered 2023-07-25 – 2023-07-26 (×2): 81 mg via ORAL
  Filled 2023-07-25 (×2): qty 1

## 2023-07-25 MED ORDER — KETOROLAC TROMETHAMINE 15 MG/ML IJ SOLN
7.5000 mg | Freq: Four times a day (QID) | INTRAMUSCULAR | Status: AC
  Administered 2023-07-25 (×3): 7.5 mg via INTRAVENOUS
  Filled 2023-07-25 (×3): qty 1

## 2023-07-25 MED ORDER — MENTHOL 3 MG MT LOZG: 1.0000 | LOZENGE | OROMUCOSAL | Status: DC | PRN

## 2023-07-25 MED ORDER — OXYCODONE HCL 5 MG/5ML PO SOLN: 5.0000 mg | Freq: Once | ORAL | Status: DC | PRN

## 2023-07-25 MED ORDER — METFORMIN HCL 500 MG PO TABS
500.0000 mg | ORAL_TABLET | Freq: Two times a day (BID) | ORAL | Status: DC
  Administered 2023-07-25 – 2023-07-26 (×2): 500 mg via ORAL
  Filled 2023-07-25 (×2): qty 1

## 2023-07-25 MED ORDER — CEFAZOLIN SODIUM-DEXTROSE 2-4 GM/100ML-% IV SOLN
2.0000 g | Freq: Four times a day (QID) | INTRAVENOUS | Status: AC
  Administered 2023-07-25 (×2): 2 g via INTRAVENOUS
  Filled 2023-07-25 (×2): qty 100

## 2023-07-25 MED ORDER — PROPOFOL 10 MG/ML IV BOLUS
INTRAVENOUS | Status: AC
  Filled 2023-07-25: qty 20

## 2023-07-25 MED ORDER — IRBESARTAN 150 MG PO TABS
300.0000 mg | ORAL_TABLET | Freq: Every day | ORAL | Status: DC
  Administered 2023-07-25 – 2023-07-26 (×2): 300 mg via ORAL
  Filled 2023-07-25 (×2): qty 2

## 2023-07-25 MED ORDER — SODIUM CHLORIDE 0.9 % IR SOLN
Status: DC | PRN
  Administered 2023-07-25: 1000 mL

## 2023-07-25 MED ORDER — PHENOL 1.4 % MT LIQD: 1.0000 | OROMUCOSAL | Status: DC | PRN

## 2023-07-25 MED ORDER — PROPOFOL 500 MG/50ML IV EMUL
INTRAVENOUS | Status: DC | PRN
  Administered 2023-07-25: 75 ug/kg/min via INTRAVENOUS

## 2023-07-25 MED ORDER — LACTATED RINGERS IV SOLN: INTRAVENOUS | Status: DC

## 2023-07-25 MED ORDER — MIDAZOLAM HCL 2 MG/2ML IJ SOLN: 0.5000 mg | Freq: Once | INTRAMUSCULAR | Status: DC | PRN

## 2023-07-25 MED ORDER — BUPIVACAINE-EPINEPHRINE (PF) 0.25% -1:200000 IJ SOLN
INTRAMUSCULAR | Status: DC | PRN
  Administered 2023-07-25: 30 mL

## 2023-07-25 MED ORDER — ORAL CARE MOUTH RINSE: 15.0000 mL | Freq: Once | OROMUCOSAL | Status: AC

## 2023-07-25 MED ORDER — ACETAMINOPHEN 500 MG PO TABS
1000.0000 mg | ORAL_TABLET | Freq: Once | ORAL | Status: AC
  Administered 2023-07-25: 1000 mg via ORAL
  Filled 2023-07-25: qty 2

## 2023-07-25 MED ORDER — HYDROMORPHONE HCL 1 MG/ML IJ SOLN: 0.2500 mg | INTRAMUSCULAR | Status: DC | PRN

## 2023-07-25 MED ORDER — EPHEDRINE SULFATE-NACL 50-0.9 MG/10ML-% IV SOSY
PREFILLED_SYRINGE | INTRAVENOUS | Status: DC | PRN
Start: 2023-07-25 — End: 2023-07-25
  Administered 2023-07-25: 10 mg via INTRAVENOUS

## 2023-07-25 MED ORDER — HYDROMORPHONE HCL 1 MG/ML IJ SOLN
0.5000 mg | INTRAMUSCULAR | Status: DC | PRN
  Administered 2023-07-25: 1 mg via INTRAVENOUS
  Filled 2023-07-25: qty 1

## 2023-07-25 MED ORDER — PANTOPRAZOLE SODIUM 40 MG PO TBEC
40.0000 mg | DELAYED_RELEASE_TABLET | Freq: Every day | ORAL | Status: DC
  Administered 2023-07-25 – 2023-07-26 (×2): 40 mg via ORAL
  Filled 2023-07-25 (×2): qty 1

## 2023-07-25 MED ORDER — MIDAZOLAM HCL 2 MG/2ML IJ SOLN
INTRAMUSCULAR | Status: AC
  Filled 2023-07-25: qty 2

## 2023-07-25 MED ORDER — BUPIVACAINE-EPINEPHRINE (PF) 0.25% -1:200000 IJ SOLN
INTRAMUSCULAR | Status: AC
  Filled 2023-07-25: qty 30

## 2023-07-25 MED ORDER — SODIUM CHLORIDE 0.9 % IV SOLN: INTRAVENOUS | Status: AC

## 2023-07-25 MED ORDER — VALSARTAN-HYDROCHLOROTHIAZIDE 320-25 MG PO TABS: 1.0000 | ORAL_TABLET | Freq: Every day | ORAL | Status: DC

## 2023-07-25 MED ORDER — ALUM & MAG HYDROXIDE-SIMETH 200-200-20 MG/5ML PO SUSP: 30.0000 mL | ORAL | Status: DC | PRN

## 2023-07-25 MED ORDER — OXYCODONE HCL 5 MG PO TABS: 10.0000 mg | ORAL_TABLET | ORAL | Status: DC | PRN

## 2023-07-25 SURGICAL SUPPLY — 60 items
BAG COUNTER SPONGE SURGICOUNT (BAG) ×1 IMPLANT
BANDAGE ESMARK 6X9 LF (GAUZE/BANDAGES/DRESSINGS) ×1 IMPLANT
BLADE SAG 18X100X1.27 (BLADE) ×1 IMPLANT
BLADE SAW SGTL 11.0X1.19X90.0M (BLADE) IMPLANT
BNDG ELASTIC 6X5.8 VLCR STR LF (GAUZE/BANDAGES/DRESSINGS) ×2 IMPLANT
BNDG ESMARK 6X9 LF (GAUZE/BANDAGES/DRESSINGS) ×1 IMPLANT
BOWL SMART MIX CTS (DISPOSABLE) IMPLANT
COMP FEM PS STD KNEE 10 RT (Joint) IMPLANT
COMP PATELLA PEG 3 32 (Joint) ×1 IMPLANT
COMPONENT PATELLA PEG 3 32 (Joint) IMPLANT
COOLER ICEMAN CLASSIC (MISCELLANEOUS) ×1 IMPLANT
COVER SURGICAL LIGHT HANDLE (MISCELLANEOUS) ×1 IMPLANT
CUFF TOURN SGL QUICK 42 (TOURNIQUET CUFF) IMPLANT
CUFF TRNQT CYL 34X4.125X (TOURNIQUET CUFF) ×1 IMPLANT
DRAPE EXTREMITY T 121X128X90 (DISPOSABLE) ×1 IMPLANT
DRAPE HALF SHEET 40X57 (DRAPES) ×1 IMPLANT
DRAPE U-SHAPE 47X51 STRL (DRAPES) ×1 IMPLANT
DRSG XEROFORM 1X8 (GAUZE/BANDAGES/DRESSINGS) IMPLANT
DURAPREP 26ML APPLICATOR (WOUND CARE) ×1 IMPLANT
ELECT CAUTERY BLADE 6.4 (BLADE) ×1 IMPLANT
ELECT REM PT RETURN 9FT ADLT (ELECTROSURGICAL) ×1 IMPLANT
ELECTRODE REM PT RTRN 9FT ADLT (ELECTROSURGICAL) ×1 IMPLANT
FACESHIELD WRAPAROUND (MASK) ×2 IMPLANT
FACESHIELD WRAPAROUND OR TEAM (MASK) ×2 IMPLANT
GAUZE PAD ABD 8X10 STRL (GAUZE/BANDAGES/DRESSINGS) ×1 IMPLANT
GAUZE SPONGE 4X4 12PLY STRL (GAUZE/BANDAGES/DRESSINGS) ×1 IMPLANT
GAUZE XEROFORM 1X8 LF (GAUZE/BANDAGES/DRESSINGS) ×1 IMPLANT
GLOVE BIOGEL PI IND STRL 8 (GLOVE) ×2 IMPLANT
GLOVE ORTHO TXT STRL SZ7.5 (GLOVE) ×1 IMPLANT
GLOVE SURG ORTHO 8.0 STRL STRW (GLOVE) ×1 IMPLANT
GOWN STRL REUS W/ TWL LRG LVL3 (GOWN DISPOSABLE) IMPLANT
GOWN STRL REUS W/ TWL XL LVL3 (GOWN DISPOSABLE) ×2 IMPLANT
IMMOBILIZER KNEE 22 UNIV (SOFTGOODS) ×1 IMPLANT
INSERT TIBIAL VE R EF10-11X12 (Insert) IMPLANT
IV NS 1000ML BAXH (IV SOLUTION) ×1 IMPLANT
KIT BASIN OR (CUSTOM PROCEDURE TRAY) ×1 IMPLANT
KIT TURNOVER KIT B (KITS) ×1 IMPLANT
MANIFOLD NEPTUNE II (INSTRUMENTS) ×1 IMPLANT
NDL 18GX1X1/2 (RX/OR ONLY) (NEEDLE) IMPLANT
NEEDLE 18GX1X1/2 (RX/OR ONLY) (NEEDLE) IMPLANT
NS IRRIG 1000ML POUR BTL (IV SOLUTION) ×1 IMPLANT
PACK TOTAL JOINT (CUSTOM PROCEDURE TRAY) ×1 IMPLANT
PAD ARMBOARD 7.5X6 YLW CONV (MISCELLANEOUS) ×1 IMPLANT
PAD COLD SHLDR WRAP-ON (PAD) ×1 IMPLANT
PADDING CAST COTTON 6X4 STRL (CAST SUPPLIES) ×1 IMPLANT
PIN DRILL HDLS TROCAR 75 4PK (PIN) IMPLANT
PROS TIB KNEE PS 0D F RT (Joint) ×1 IMPLANT
PROSTHESIS TIB KNEE PS 0D F RT (Joint) IMPLANT
SCREW FEMALE HEX FIX 25X2.5 (ORTHOPEDIC DISPOSABLE SUPPLIES) IMPLANT
SET HNDPC FAN SPRY TIP SCT (DISPOSABLE) ×1 IMPLANT
SET PAD KNEE POSITIONER (MISCELLANEOUS) ×1 IMPLANT
STAPLER VISISTAT 35W (STAPLE) ×1 IMPLANT
SUCTION TUBE FRAZIER 10FR DISP (SUCTIONS) ×1 IMPLANT
SUT VIC AB 0 CT1 27XBRD ANBCTR (SUTURE) ×1 IMPLANT
SUT VIC AB 1 CT1 27XBRD ANBCTR (SUTURE) ×2 IMPLANT
SUT VIC AB 2-0 CT1 TAPERPNT 27 (SUTURE) ×2 IMPLANT
SYR 50ML LL SCALE MARK (SYRINGE) IMPLANT
TOWEL GREEN STERILE (TOWEL DISPOSABLE) ×1 IMPLANT
TOWEL GREEN STERILE FF (TOWEL DISPOSABLE) ×1 IMPLANT
TRAY CATH INTERMITTENT SS 16FR (CATHETERS) IMPLANT

## 2023-07-25 NOTE — Interval H&P Note (Signed)
History and Physical Interval Note: The patient understands that he is here today for a right total knee replacement to treat his significant right knee pain and arthritis.  There has been no acute or interval change in his medical status.  The risks and benefits of surgery have been discussed in detail and informed consent has been obtained.  The right operative knee has been marked.  07/25/2023 7:03 AM  Ricardo Reese Pain  has presented today for surgery, with the diagnosis of osteoarthritis right knee.  The various methods of treatment have been discussed with the patient and family. After consideration of risks, benefits and other options for treatment, the patient has consented to  Procedure(s): RIGHT TOTAL KNEE ARTHROPLASTY (Right) as a surgical intervention.  The patient's history has been reviewed, patient examined, no change in status, stable for surgery.  I have reviewed the patient's chart and labs.  Questions were answered to the patient's satisfaction.     Kathryne Hitch

## 2023-07-25 NOTE — Evaluation (Signed)
Physical Therapy Evaluation Patient Details Name: Ricardo Reese MRN: [id number] DOB: [birth year] Today's Date: 07/25/2023  History of Present Illness  73 y.o. male presents to [facility] hospital on 07/25/2023 for elective R TKA. PMH includes HLD, DMII, L THA, prostate CA.  Clinical Impression  Pt presents to PT with deficits in functional mobility, gait, balance, strength, power, ROM. Pt is mobilizing well, ambulating with RW for household distances s/p TKA. PT will follow up tomorrow for further mobility, gait, and stair training.         If plan is discharge home, recommend the following: A little help with bathing/dressing/bathroom;Assistance with cooking/housework;Help with stairs or ramp for entrance;Assist for transportation   Can travel by private vehicle        Equipment Recommendations Rolling walker (2 wheels);BSC/3in1  Recommendations for Other Services       Functional Status Assessment Patient has had a recent decline in their functional status and demonstrates the ability to make significant improvements in function in a reasonable and predictable amount of time.     Precautions / Restrictions Precautions Precautions: Fall;Knee Precaution Booklet Issued: Yes (comment) Recall of Precautions/Restrictions: Intact Restrictions Weight Bearing Restrictions Per Provider Order: Yes RLE Weight Bearing Per Provider Order: Weight bearing as tolerated      Mobility  Bed Mobility Overal bed mobility: Needs Assistance Bed Mobility: Supine to Sit, Sit to Supine     Supine to sit: Supervision Sit to supine: Supervision        Transfers Overall transfer level: Needs assistance Equipment used: Rolling walker (2 wheels) Transfers: Sit to/from Stand Sit to Stand: Contact guard assist                Ambulation/Gait Ambulation/Gait assistance: Contact guard assist Gait Distance (Feet): 100 Feet Assistive device: Rolling walker (2 wheels) Gait Pattern/deviations: Step-to  pattern, Step-through pattern Gait velocity: reduced Gait velocity interpretation: <1.8 ft/sec, indicate of risk for recurrent falls   General Gait Details: step-to gait initially progressing to step-through with PT verbal cues  Stairs            Wheelchair Mobility     Tilt Bed    Modified Rankin (Stroke Patients Only)       Balance Overall balance assessment: Needs assistance Sitting-balance support: No upper extremity supported, Feet supported Sitting balance-Leahy Scale: Good     Standing balance support: Single extremity supported, Reliant on assistive device for balance Standing balance-Leahy Scale: Poor                               Pertinent Vitals/Pain Pain Assessment Pain Assessment: 0-10 Pain Score: 4  Pain Location: R knee Pain Descriptors / Indicators: Sore Pain Intervention(s): Monitored during session    Home Living Family/patient expects to be discharged to:: Private residence Living Arrangements: Spouse/significant other Available Help at Discharge: Family;Available 24 hours/day Type of Home: House Home Access: Stairs to enter Entrance Stairs-Rails: Right Entrance Stairs-Number of Steps: 3 Alternate Level Stairs-Number of Steps: 15 Home Layout: Two level Home Equipment: Cane - single point      Prior Function Prior Level of Function : Independent/Modified Independent             Mobility Comments: ambulatory without DME       Extremity/Trunk Assessment   Upper Extremity Assessment Upper Extremity Assessment: Overall WFL for tasks assessed    Lower Extremity Assessment Lower Extremity Assessment: RLE deficits/detail RLE Deficits / Details: post-op knee  ROM and strength deficits as anticipated on POD 0 s/p TKA    Cervical / Trunk Assessment Cervical / Trunk Assessment: Normal  Communication   Communication Communication: No apparent difficulties    Cognition Arousal: Alert Behavior During Therapy: WFL for  tasks assessed/performed   PT - Cognitive impairments: No apparent impairments                         Following commands: Intact       Cueing Cueing Techniques: Verbal cues     General Comments General comments (skin integrity, edema, etc.): VSS on RA    Exercises Other Exercises Other Exercises: PT provides education on TKR exercise packet   Assessment/Plan    PT Assessment Patient needs continued PT services  PT Problem List Decreased strength;Decreased range of motion;Decreased activity tolerance;Decreased balance;Decreased mobility;Decreased knowledge of use of DME;Pain       PT Treatment Interventions DME instruction;Gait training;Stair training;Functional mobility training;Therapeutic activities;Therapeutic exercise;Balance training;Neuromuscular re-education;Patient/family education    PT Goals (Current goals can be found in the Care Plan section)  Acute Rehab PT Goals Patient Stated Goal: to return to independence PT Goal Formulation: With patient Time For Goal Achievement: 07/29/23 Potential to Achieve Goals: Good    Frequency 7X/week     Co-evaluation               AM-PAC PT "6 Clicks" Mobility  Outcome Measure Help needed turning from your back to your side while in a flat bed without using bedrails?: A Little Help needed moving from lying on your back to sitting on the side of a flat bed without using bedrails?: A Little Help needed moving to and from a bed to a chair (including a wheelchair)?: A Little Help needed standing up from a chair using your arms (e.g., wheelchair or bedside chair)?: A Little Help needed to walk in hospital room?: A Little Help needed climbing 3-5 steps with a railing? : A Little 6 Click Score: 18    End of Session Equipment Utilized During Treatment: Gait belt Activity Tolerance: Patient tolerated treatment well Patient left: in bed;with call bell/phone within reach Nurse Communication: Mobility status PT  Visit Diagnosis: Other abnormalities of gait and mobility (R26.89);Muscle weakness (generalized) (M62.81);Pain Pain - Right/Left: Right Pain - part of body: Knee    Time: 1343-1416 PT Time Calculation (min) (ACUTE ONLY): 33 min   Charges:   PT Evaluation $PT Eval Low Complexity: 1 Low   PT General Charges $$ ACUTE PT VISIT: 1 Visit         Arlyss Gandy, PT, DPT Acute Rehabilitation Office [phone]   Arlyss Gandy 07/25/2023, 2:29 PM

## 2023-07-25 NOTE — Discharge Instructions (Signed)
Per [facility] clinic policy, our goal is ensure optimal postoperative pain control with a multimodal pain management strategy. For all [facility] patients, our goal is to wean post-operative narcotic medications by 6 weeks post-operatively. If this is not possible due to utilization of pain medication prior to surgery, your [facility] doctor will support your acute post-operative pain control for the first 6 weeks postoperatively, with a plan to transition you back to your primary pain team following that. Cyndia Skeeters will work to ensure a Therapist, occupational.  INSTRUCTIONS AFTER JOINT REPLACEMENT   Remove items at home which could result in a fall. This includes throw rugs or furniture in walking pathways ICE to the affected joint every three hours while awake for 30 minutes at a time, for at least the first 3-5 days, and then as needed for pain and swelling.  Continue to use ice for pain and swelling. You may notice swelling that will progress down to the foot and ankle.  This is normal after surgery.  Elevate your leg when you are not up walking on it.   Continue to use the breathing machine you got in the hospital (incentive spirometer) which will help keep your temperature down.  It is common for your temperature to cycle up and down following surgery, especially at night when you are not up moving around and exerting yourself.  The breathing machine keeps your lungs expanded and your temperature down.   DIET:  As you were doing prior to hospitalization, we recommend a well-balanced diet.  DRESSING / WOUND CARE / SHOWERING  Keep the surgical dressing until follow up.  The dressing is water proof, so you can shower without any extra covering.  IF THE DRESSING FALLS OFF or the wound gets wet inside, change the dressing with sterile gauze.  Please use good hand washing techniques before changing the dressing.  Do not use any lotions or creams on the incision until instructed by your surgeon.     ACTIVITY  Increase activity slowly as tolerated, but follow the weight bearing instructions below.   No driving for 6 weeks or until further direction given by your physician.  You cannot drive while taking narcotics.  No lifting or carrying greater than 10 lbs. until further directed by your surgeon. Avoid periods of inactivity such as sitting longer than an hour when not asleep. This helps prevent blood clots.  You may return to work once you are authorized by your doctor.     WEIGHT BEARING   Weight bearing as tolerated with assist device (walker, cane, etc) as directed, use it as long as suggested by your surgeon or therapist, typically at least 4-6 weeks.   EXERCISES  Results after joint replacement surgery are often greatly improved when you follow the exercise, range of motion and muscle strengthening exercises prescribed by your doctor. Safety measures are also important to protect the joint from further injury. Any time any of these exercises cause you to have increased pain or swelling, decrease what you are doing until you are comfortable again and then slowly increase them. If you have problems or questions, call your caregiver or physical therapist for advice.   Rehabilitation is important following a joint replacement. After just a few days of immobilization, the muscles of the leg can become weakened and shrink (atrophy).  These exercises are designed to build up the tone and strength of the thigh and leg muscles and to improve motion. Often times heat used for twenty to thirty minutes before  or if they get worse, check with your doctor.  If you experience "the worst abdominal pain ever" or develop nausea or vomiting, please contact the office immediately for further recommendations for treatment.   ITCHING:  If you experience itching with your medications, try taking only a single pain pill, or even half a pain pill at a time.  You can also use Benadryl over the counter for itching or also to help with sleep.   TED HOSE STOCKINGS:  Use stockings on both legs until for at least 2 weeks or as directed by physician office. They may be removed at night for sleeping.  MEDICATIONS:  See your medication summary on the "After Visit Summary" that nursing will review with you.  You may have some home medications which will be placed on hold until you complete the course of blood thinner medication.  It is important for you to complete the blood thinner medication as prescribed.  PRECAUTIONS:  If you experience chest pain or shortness of breath - call 911 immediately for transfer to the hospital emergency department.    If you develop a fever greater that 101 F, purulent drainage from wound, increased redness or drainage from wound, foul odor from the wound/dressing, or calf pain - CONTACT YOUR SURGEON.                                                   FOLLOW-UP APPOINTMENTS:  If you do not already have a post-op appointment, please call the office for an appointment to be seen by your surgeon.  Guidelines for how soon to be seen are listed in your "After Visit Summary", but are typically between 1-4 weeks after surgery.  OTHER INSTRUCTIONS:   Knee Replacement:  Do not place pillow under knee, focus on keeping the knee straight while resting. CPM instructions: 0-90 degrees, 2 hours in the morning, 2 hours in the afternoon, and 2 hours in the evening. Place foam block, curve side up under heel at all times except when in CPM or when walking.  DO NOT modify, tear, cut, or change the foam block in any way.  POST-OPERATIVE OPIOID TAPER INSTRUCTIONS: It is important to wean off of your opioid medication as soon as possible. If you do not need pain medication after your surgery it is ok to stop day one. Opioids include: Codeine, Hydrocodone(Norco, Vicodin), Oxycodone(Percocet, oxycontin) and hydromorphone amongst others.  Long term and even short term use of opiods can cause: Increased pain response Dependence Constipation Depression Respiratory depression And more.  Withdrawal symptoms can include Flu like symptoms Nausea, vomiting And more Techniques to manage these symptoms Hydrate well Eat regular healthy meals Stay active Use relaxation techniques(deep breathing, meditating, yoga) Do Not substitute Alcohol to help with tapering If you have been on opioids for less than two weeks and do not have pain than it is ok to stop all together.  Plan to wean off of opioids This plan should start within one week post op of your joint replacement. Maintain the same interval or time between taking each dose  and first decrease the dose.  Cut the total daily intake of opioids by one tablet each day Next start to increase the time between doses. The last dose that should be eliminated is the evening dose.   MAKE SURE YOU:  Understand these instructions.  Get help right away if you are not doing well or get worse.    Thank you for letting us be a part of your medical care team.  It is a privilege we respect greatly.  We hope these instructions will help you stay on track for a fast and full recovery!      Dental Antibiotics:  In most cases prophylactic antibiotics for Dental procdeures after total joint surgery are not necessary.  Exceptions are as follows:  1. History of prior total joint infection  2. Severely immunocompromised (Organ Transplant, cancer chemotherapy, Rheumatoid biologic meds such as Humera)  3. Poorly controlled diabetes (A1C &gt; 8.0, blood glucose over 200)  If you have one of these conditions, contact your surgeon for an antibiotic prescription, prior to your dental procedure.

## 2023-07-25 NOTE — Anesthesia Procedure Notes (Signed)
Spinal  Patient location during procedure: OR End time: 07/25/2023 7:39 AM Reason for block: surgical anesthesia Staffing Performed: anesthesiologist  Anesthesiologist: Jairo Ben, MD Performed by: Jairo Ben, MD Authorized by: Jairo Ben, MD   Preanesthetic Checklist Completed: patient identified, IV checked, site marked, risks and benefits discussed, surgical consent, monitors and equipment checked, pre-op evaluation and timeout performed Spinal Block Patient position: sitting Prep: DuraPrep and site prepped and draped Patient monitoring: blood pressure, continuous pulse ox, cardiac monitor and heart rate Approach: midline Location: L3-4 Injection technique: single-shot Needle Needle type: Pencan and Introducer  Needle gauge: 24 G Needle length: 9 cm Assessment Events: CSF return Additional Notes Pt identified in Operating room.  Monitors applied. Working IV access confirmed. Sterile prep, drape lumbar spine.  1% lido local L 3,4.  #24ga Pencan into clear CSF L 3,4.  12mg  0.75% Bupivacaine with dextrose injected with asp CSF beginning and end of injection.  Patient asymptomatic, VSS, no heme aspirated, tolerated well.  Sandford Craze, MD

## 2023-07-25 NOTE — Transfer of Care (Signed)
Immediate Anesthesia Transfer of Care Note  Patient: Ricardo Reese  Procedure(s) Performed: RIGHT TOTAL KNEE ARTHROPLASTY (Right: Knee)  Patient Location: PACU  Anesthesia Type:MAC combined with regional for post-op pain  Level of Consciousness: awake, oriented, and patient cooperative  Airway & Oxygen Therapy: Patient Spontanous Breathing  Post-op Assessment: Report given to RN and Post -op Vital signs reviewed and stable  Post vital signs: Reviewed and stable  Last Vitals:  Vitals Value Taken Time  BP 112/44 07/25/23 0949  Temp 36.5 C 07/25/23 0945  Pulse 63 07/25/23 0951  Resp 17 07/25/23 0951  SpO2 98 % 07/25/23 0951  Vitals shown include unfiled device data.  Last Pain:  Vitals:   07/25/23 0605  TempSrc:   PainSc: 0-No pain      Patients Stated Pain Goal: 0 (07/25/23 1610)  Complications: No notable events documented.Patient with BP in high 80s  Dr Jean Rosenthal notified  I told her I gave 10 mg of Ephedrine IV.  She ordered a bottle of albumin in PACU

## 2023-07-25 NOTE — Op Note (Signed)
Operative Note  Date of operation: 07/25/2023 Preoperative diagnosis: Right knee primary osteoarthritis Postoperative diagnosis: Same  Procedure: Right press-fit total knee arthroplasty  Implants: Biomet/Zimmer persona press-fit knee system Implant Name Type Inv. Item Serial No. Manufacturer Lot No. LRB No. Used Action  COMP PATELLA PEG 3 32 - [id number] Joint COMP PATELLA PEG 3 32  ZIMMER RECON(ORTH,TRAU,BIO,SG) [id number] Right 1 Implanted  PROS TIB KNEE PS 0D F RT - [id number] Joint PROS TIB KNEE PS 0D F RT  ZIMMER RECON(ORTH,TRAU,BIO,SG) [id number] Right 1 Implanted  POSTERIOR STABILIZED(PS) RIGHT SIZE 10    ZIMMER [id number] Right 1 Implanted  INSERT TIBIAL VE R EF10-11X12 - [id number] Insert INSERT TIBIAL VE R EF10-11X12  ZIMMER RECON(ORTH,TRAU,BIO,SG) [id number] Right 1 Implanted   Surgeon: Vanita Panda. 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  Many years ago he had some type of open cartilage surgery on his knee.  At this point his right knee pain is daily and it is detrimentally affecting his mobility, his quality of life and his actives daily living.  He has a slight flexion contracture of that knee as well as significant varus malalignment.  His x-rays show bone-on-bone wear of the knee.  He has tried and failed conservative treatment and at this point does wish to proceed with a knee replacement and we agree with this as well based on his clinical exam findings and x-ray findings combined with the failure of conservative treatment.  We did discuss the risks of acute blood loss anemia, nerve vessel injury, fracture, infection, DVT,  implant failure and wound healing issues.  He understands that our goals are hopefully decreased pain, improved mobility, and improved quality of life.  Procedure description: After informed consent was obtained and the appropriate right knee was marked, anesthesia obtained a right lower extremity adductor canal block in the holding room.  The patient was then brought to the operating room and set up on the operating table where spinal anesthesia was obtained.  He was then laid in supine position on the operating table and a Foley catheter was placed.  A nonsterile tractors placed around his upper right thigh and his right thigh, knee, leg and ankle were prepped and draped with DuraPrep and sterile drapes including a sterile stockinette.  A timeout was called and he was identified as the correct patient the correct right knee.  An Esmarch was used to wrap out the leg and the tourniquet was inflated to 300 mm pressure.  With the knee extended a direct midline incision was made over the patella and carried proximally distally.  Dissection was carried down the knee joint and a medial parapatellar arthrotomy was made finding a very large joint effusion.  With the knee in a flexed position we found significant cartilage loss throughout the knee.  We removed osteophytes in all 3 compartments as well as remnants of the ACL, PCL and medial lateral meniscus.  We then used an extramedullary based cutting guide for making our proximal tibia cut correction of varus and valgus and a 7 degree slope.  We made this cut to take 2 mm off the low side and we backed this down to more millimeters.  We then used a intramedullary based cutting guide for our distal femur cut setting this for a right knee at 5 degrees externally rotated with the guide placed in the notch of the knee.  We made this cut to take a 10 mm off the distal femur.  We then brought the knee back down to full extension and had achieved full extension with a 10 mm  extension block.  We then backed the femur and put a femoral sizing guide based off the epicondylar axis.  Based off of this we chose a size 10 femur.  We put a 4-in-1 cutting block for a size 10 femur and made our anterior and posterior cuts followed by the chamfer cuts.  We then made our femoral box cut as well.  Attention was then turned to the tibia.  We chose a size F right tibial tray for coverage over the tibial plateau setting the rotation of the tibial tubercle and the femur.  We did our drill hole and keel punch over this and we found excellent quality bone for press-fit implants.  We then trialed our size F right tibial tray combined with our size 10 right standard PS femur.  We went up to a 12 mm thickness PS polyethylene insert and we are pleased with range of motion and stability without insert.  We then made a patella cut and drilled 3 holes for a size 32 press-fit patella button.  Again with all trial instrumentation in the knee we are pleased with range of motion and stability.  We then irrigated the knee with normal saline solution using pulsatile lavage after removing all the trial components.  We placed Marcaine with epinephrine around the arthrotomy.  With the knee in a flexed position we then dried the knee roll well and placed our Biomet/Zimmer persona press-fit tibial tray for right knee size F followed by placing our right size 10 standard PS femur and that was press-fit.  We placed our 12 mm PS polyethylene insert and press-fit our size 32 patella button.  Again we are pleased with range of motion and stability.  We then let the tourniquet down and hemostasis was obtained with electrocautery.  The arthrotomy was closed with interrupted #1 Vicryl suture followed by 0 Vicryl to close deep tissue and 2-0 Vicryl to close subcutaneous tissue.  The skin was closed with staples.  Well-padded sterile dressing is applied.  The patient was taken the recovery room.  Rexene Edison, PA-C did assist during  the entire case and beginning the end and his assistance was crucial and medically necessary for soft tissue management and retraction, helping guide implant placement and a layered closure of the wound.

## 2023-07-25 NOTE — Anesthesia Postprocedure Evaluation (Signed)
Anesthesia Post Note  Patient: Trestan Vahle Winegarden  Procedure(s) Performed: RIGHT TOTAL KNEE ARTHROPLASTY (Right: Knee)     Patient location during evaluation: PACU Anesthesia Type: Spinal Level of consciousness: oriented, awake and alert and patient cooperative Pain management: pain level controlled Vital Signs Assessment: post-procedure vital signs reviewed and stable Respiratory status: spontaneous breathing, respiratory function stable and nonlabored ventilation Cardiovascular status: blood pressure returned to baseline and stable Postop Assessment: no headache, no backache, no apparent nausea or vomiting, spinal receding and patient able to bend at knees Anesthetic complications: no   No notable events documented.  Last Vitals:  Vitals:   07/25/23 1000 07/25/23 1015  BP: (!) 133/55 137/77  Pulse: (!) 56 (!) 57  Resp: 15 16  Temp:    SpO2: 95% 98%    Last Pain:  Vitals:   07/25/23 1015  TempSrc:   PainSc: 0-No pain                 Kirstein Baxley,E. Storm Dulski

## 2023-07-25 NOTE — Anesthesia Procedure Notes (Signed)
Anesthesia Regional Block: Adductor canal block   Pre-Anesthetic Checklist: , timeout performed,  Correct Patient, Correct Site, Correct Laterality,  Correct Procedure, Correct Position, site marked,  Risks and benefits discussed,  Surgical consent,  Pre-op evaluation,  At surgeon's request and post-op pain management  Laterality: Right and Lower  Prep: chloraprep       Needles:  Injection technique: Single-shot  Needle Type: Echogenic Needle     Needle Length: 9cm  Needle Gauge: 21     Additional Needles:   Procedures:,,,, ultrasound used (permanent image in chart),,    Narrative:  Start time: 07/25/2023 7:01 AM End time: 07/25/2023 7:08 AM Injection made incrementally with aspirations every 5 mL.  Performed by: Personally  Anesthesiologist: Jairo Ben, MD  Additional Notes: Pt identified in Holding room.  Monitors applied. Working IV access confirmed. Timeout, Sterile prep R thigh.  #21ga ECHOgenic Arrow block needle into adductor canal with US guidance.  20cc 0.75% Ropivacaine injected incrementally after negative test dose.  Patient asymptomatic, VSS, no heme aspirated, tolerated well.  Sandford Craze, MD

## 2023-07-26 ENCOUNTER — Encounter (HOSPITAL_COMMUNITY): Payer: Self-pay | Admitting: Orthopaedic Surgery

## 2023-07-26 DIAGNOSIS — Z85828 Personal history of other malignant neoplasm of skin: Secondary | ICD-10-CM | POA: Diagnosis not present

## 2023-07-26 DIAGNOSIS — M1711 Unilateral primary osteoarthritis, right knee: Secondary | ICD-10-CM | POA: Diagnosis not present

## 2023-07-26 DIAGNOSIS — I10 Essential (primary) hypertension: Secondary | ICD-10-CM | POA: Diagnosis not present

## 2023-07-26 DIAGNOSIS — E119 Type 2 diabetes mellitus without complications: Secondary | ICD-10-CM | POA: Diagnosis not present

## 2023-07-26 DIAGNOSIS — Z8546 Personal history of malignant neoplasm of prostate: Secondary | ICD-10-CM | POA: Diagnosis not present

## 2023-07-26 DIAGNOSIS — Z96642 Presence of left artificial hip joint: Secondary | ICD-10-CM | POA: Diagnosis not present

## 2023-07-26 LAB — BASIC METABOLIC PANEL WITH GFR
Anion gap: 12 (ref 5–15)
BUN: 22 mg/dL (ref 8–23)
CO2: 19 mmol/L — ABNORMAL LOW (ref 22–32)
Calcium: 9 mg/dL (ref 8.9–10.3)
Chloride: 102 mmol/L (ref 98–111)
Creatinine, Ser: 1.06 mg/dL (ref 0.61–1.24)
GFR, Estimated: >60 mL/min (ref >60)
Glucose, Bld: 202 mg/dL — ABNORMAL HIGH (ref 70–99)
Potassium: 4.2 mmol/L (ref 3.5–5.1)
Sodium: 133 mmol/L — ABNORMAL LOW (ref 135–145)

## 2023-07-26 LAB — CBC
HCT: 33.3 % — ABNORMAL LOW (ref 39.0–52.0)
Hemoglobin: 11.2 g/dL — ABNORMAL LOW (ref 13.0–17.0)
MCH: 29.7 pg (ref 26.0–34.0)
MCHC: 33.6 g/dL (ref 30.0–36.0)
MCV: 88.3 fL (ref 80.0–100.0)
Platelets: 248 10*3/uL (ref 150–400)
RBC: 3.77 MIL/uL — ABNORMAL LOW (ref 4.22–5.81)
RDW: 14 % (ref 11.5–15.5)
WBC: 8.5 10*3/uL (ref 4.0–10.5)
nRBC: 0 % (ref 0.0–0.2)

## 2023-07-26 LAB — GLUCOSE, CAPILLARY: Glucose-Capillary: 142 mg/dL — ABNORMAL HIGH (ref 70–99)

## 2023-07-26 MED ORDER — METHOCARBAMOL 500 MG PO TABS: 500.0000 mg | ORAL_TABLET | Freq: Four times a day (QID) | ORAL | 1 refills | Status: DC | PRN

## 2023-07-26 MED ORDER — ACETAMINOPHEN 325 MG PO TABS: 325.0000 mg | ORAL_TABLET | Freq: Four times a day (QID) | ORAL | 0 refills | Status: AC | PRN

## 2023-07-26 MED ORDER — OXYCODONE HCL 5 MG PO TABS
5.0000 mg | ORAL_TABLET | Freq: Four times a day (QID) | ORAL | 0 refills | Status: DC | PRN
Start: 2023-07-26 — End: 2023-08-07

## 2023-07-26 MED ORDER — ASPIRIN 81 MG PO CHEW: 81.0000 mg | CHEWABLE_TABLET | Freq: Two times a day (BID) | ORAL | 0 refills | Status: DC

## 2023-07-26 NOTE — Progress Notes (Signed)
Subjective: 1 Day Post-Op Procedure(s) (LRB): RIGHT TOTAL KNEE ARTHROPLASTY (Right) Patient reports pain as moderate.  Out walking in hallway with PT this AM.   Objective: Vital signs in last 24 hours: Temp:  [97.5 F (36.4 C)-98.8 F (37.1 C)] 98.1 F (36.7 C) (02/12 0742) Pulse Rate:  [49-83] 83 (02/12 0742) Resp:  [11-20] 18 (02/12 0742) BP: (98-167)/(39-83) 142/77 (02/12 0742) SpO2:  [95 %-100 %] 99 % (02/12 0742)  Intake/Output from previous day: 02/11 0701 - 02/12 0700 In: 1300 [I.V.:1300] Out: 1250 [Urine:1200; Blood:50] Intake/Output this shift: No intake/output data recorded.  Recent Labs    07/25/23 0557  HGB 14.1   Recent Labs    07/25/23 0557  WBC 6.3  RBC 4.77  HCT 42.7  PLT 318   Recent Labs    07/25/23 0557  NA 137  K 4.8  CL 103  CO2 25  BUN 23  CREATININE 1.05  GLUCOSE 92  CALCIUM 9.6   No results for input(s): "LABPT", "INR" in the last 72 hours.  Right lower extremity: Dorsiflexion/Plantar flexion intact Incision: moderate drainage Compartment soft   Assessment/Plan: 1 Day Post-Op Procedure(s) (LRB): RIGHT TOTAL KNEE ARTHROPLASTY (Right) Discharge home with home health     Ricardo Reese 07/26/2023, 8:26 AM

## 2023-07-26 NOTE — Plan of Care (Signed)

## 2023-07-26 NOTE — Progress Notes (Signed)
Physical Therapy Treatment Patient Details Name: Ricardo Reese MRN: [id number] DOB: 11-Mar-1951 Today's Date: 07/26/2023   History of Present Illness 73 y.o. male presents to [facility] on 07/25/2023 for elective R TKA. PMH includes HLD, DMII, L THA, prostate CA.    PT Comments  Pt progressing well towards all goals. Pt achieved 90 deg AA R knee flexion while sitting EOB. Pt able to negotiate stair necessary to access bed/bath. Pt does not need another PT session prior to d/c home. Pt to start HHPT per ortho PA and is able to safely get in/out of home and access bed bath. Pt functioning at supervision level with use of RW. Acute PT to cont to follow.   If plan is discharge home, recommend the following: A little help with bathing/dressing/bathroom;Assistance with cooking/housework;Help with stairs or ramp for entrance;Assist for transportation   Can travel by private vehicle        Equipment Recommendations  Rolling walker (2 wheels);BSC/3in1    Recommendations for Other Services       Precautions / Restrictions Precautions Precautions: Fall;Knee Precaution Booklet Issued: Yes (comment) Recall of Precautions/Restrictions: Intact Restrictions Weight Bearing Restrictions Per Provider Order: Yes RLE Weight Bearing Per Provider Order: Weight bearing as tolerated     Mobility  Bed Mobility Overal bed mobility: Needs Assistance Bed Mobility: Supine to Sit, Sit to Supine     Supine to sit: Supervision Sit to supine: Supervision   General bed mobility comments: able to manage R LE without assist, HOB elevated    Transfers Overall transfer level: Needs assistance Equipment used: Rolling walker (2 wheels) Transfers: Sit to/from Stand Sit to Stand: Contact guard assist           General transfer comment: increased time    Ambulation/Gait Ambulation/Gait assistance: Contact guard assist Gait Distance (Feet): 200 Feet Assistive device: Rolling walker (2 wheels) Gait  Pattern/deviations: Step-through pattern Gait velocity: reduced Gait velocity interpretation: <1.31 ft/sec, indicative of household ambulator   General Gait Details: step-to gait initially progressing to step-through with PT verbal cues, verbal cues to decrease bilat UE dependency   Stairs Stairs: Yes Stairs assistance: Contact guard assist Stair Management: One rail Right, Sideways, Step to pattern Number of Stairs: 12 General stair comments: pt with good return demonstration of "up with the good, down with the bad" using R HR to mimic home set up   Wheelchair Mobility     Tilt Bed    Modified Rankin (Stroke Patients Only)       Balance Overall balance assessment: Needs assistance Sitting-balance support: No upper extremity supported, Feet supported Sitting balance-Leahy Scale: Good     Standing balance support: Single extremity supported, Reliant on assistive device for balance Standing balance-Leahy Scale: Poor                              Communication Communication Communication: No apparent difficulties  Cognition Arousal: Alert Behavior During Therapy: WFL for tasks assessed/performed   PT - Cognitive impairments: No apparent impairments                       PT - Cognition Comments: pt can be tangential Following commands: Intact      Cueing Cueing Techniques: Verbal cues  Exercises General Exercises - Lower Extremity Ankle Circles/Pumps: AROM, Both, 10 reps, Supine Quad Sets: AROM, Both, 10 reps, Supine Heel Slides: AROM, Right, 10 reps, AAROM (assisted to 90 deg  knee flexion in sitting EOB)    General Comments General comments (skin integrity, edema, etc.): VSSS      Pertinent Vitals/Pain Pain Assessment Pain Assessment: 0-10 Pain Score: 2  Pain Location: R knee Pain Descriptors / Indicators: Sore Pain Intervention(s): Monitored during session    Home Living                          Prior Function             PT Goals (current goals can now be found in the care plan section) Acute Rehab PT Goals Patient Stated Goal: to return to independence PT Goal Formulation: With patient Time For Goal Achievement: 07/29/23 Potential to Achieve Goals: Good Progress towards PT goals: Progressing toward goals    Frequency    7X/week      PT Plan      Co-evaluation              AM-PAC PT "6 Clicks" Mobility   Outcome Measure  Help needed turning from your back to your side while in a flat bed without using bedrails?: A Little Help needed moving from lying on your back to sitting on the side of a flat bed without using bedrails?: A Little Help needed moving to and from a bed to a chair (including a wheelchair)?: A Little Help needed standing up from a chair using your arms (e.g., wheelchair or bedside chair)?: A Little Help needed to walk in hospital room?: A Little Help needed climbing 3-5 steps with a railing? : A Little 6 Click Score: 18    End of Session Equipment Utilized During Treatment: Gait belt Activity Tolerance: Patient tolerated treatment well Patient left: in bed;with call bell/phone within reach Nurse Communication: Mobility status PT Visit Diagnosis: Other abnormalities of gait and mobility (R26.89);Muscle weakness (generalized) (M62.81);Pain Pain - Right/Left: Right Pain - part of body: Knee     Time: 1610-9604 PT Time Calculation (min) (ACUTE ONLY): 31 min  Charges:    $Gait Training: 8-22 mins $Therapeutic Exercise: 8-22 mins PT General Charges $$ ACUTE PT VISIT: 1 Visit                     [name], PT, DPT Acute Rehabilitation Services Secure chat preferred Office #: [phone]    [name] 07/26/2023, 9:06 AM

## 2023-07-26 NOTE — Progress Notes (Signed)
Patient in no acute distress nor complaints of pain nor discomfort; incision on knee is clean, dry and intact; No c/o pain at this time. Room was checked and accounted for all patient's belongings and ice man; discharge instructions concerning her medications, incision care, follow up appointment and when to call the doctor as needed were all discussed with patient by RN and he expressed understanding on the instructions given.

## 2023-07-26 NOTE — Care Management Obs Status (Signed)
MEDICARE OBSERVATION STATUS NOTIFICATION   Patient Details  Name: Ricardo Reese MRN: [id number] Date of Birth: [birth year]   Medicare Observation Status Notification Given:  Yes    Lawerance Sabal, RN 07/26/2023, 9:29 AM

## 2023-07-26 NOTE — Discharge Summary (Signed)
Patient ID: Ricardo Reese MRN: [id number] DOB/AGE: December 24, 1950 73 y.o.  Admit date: 07/25/2023 Discharge date: 07/26/2023  Admission Diagnoses:  Principal Problem:   Unilateral primary osteoarthritis, right knee Active Problems:   OA (osteoarthritis) of knee   Status post total right knee replacement   Discharge Diagnoses:  Same  Past Medical History:  Diagnosis Date   Diabetes mellitus without complication (HCC)    History of kidney stones    has had twice   Hyperlipidemia    Hypertension    Impaired fasting glucose    Kidney stone 1983   Melanoma of back [facility]) 2013   Dr Adolphus Birchwood   Pneumonia    walking back in 2002   Prostate cancer [facility]) 2019   Skin cancer 06/14/2011   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 male who was admitted 07/25/2023 for operative treatment ofUnilateral primary osteoarthritis, right knee. Patient has severe unremitting pain that affects sleep, daily activities, and work/hobbies. After pre-op clearance the patient was taken to the operating room on 07/25/2023 and underwent  Procedure(s): RIGHT TOTAL KNEE ARTHROPLASTY.    Patient was given perioperative antibiotics:  Anti-infectives (From admission, onward)    Start     Dose/Rate Route Frequency Ordered Stop   07/25/23 1145  ceFAZolin (ANCEF) IVPB 2g/100 mL premix        2 g 200 mL/hr over 30 Minutes Intravenous Every 6 hours 07/25/23 1134 07/25/23 1821   07/25/23 0600  ceFAZolin (ANCEF) IVPB 2g/100 mL premix        2 g 200 mL/hr over 30 Minutes Intravenous On call to O.R. 07/25/23 4098 07/25/23 1191        Patient was given sequential compression devices, early ambulation, and chemoprophylaxis to prevent DVT.  Patient benefited maximally from hospital stay and there were no complications.    Recent vital signs: Patient Vitals for  the past 24 hrs:  BP Temp Temp src Pulse Resp SpO2  07/26/23 0742 (!) 142/77 98.1 F (36.7 C) Oral 83 18 99 %  07/26/23 0355 (!) 160/61 97.7 F (36.5 C) Oral 60 18 99 %  07/25/23 2255 (!) 135/57 98.4 F (36.9 C) Oral 71 18 97 %  07/25/23 1945 (!) 158/60 98.8 F (37.1 C) Oral 73 18 97 %  07/25/23 1611 (!) 167/72 97.8 F (36.6 C) Oral 76 16 99 %  07/25/23 1153 (!) 165/83 97.7 F (36.5 C) -- (!) 52 20 100 %  07/25/23 1135 -- -- -- (!) 57 12 100 %  07/25/23 1130 (!) 159/52 (!) 97.5 F (36.4 C) -- (!) 54 14 100 %  07/25/23 1100 138/73 -- -- (!) 51 12 99 %  07/25/23 1045 (!) 149/61 -- -- (!) 50 16 99 %  07/25/23 1030 137/66 -- -- (!) 49 11 100 %  07/25/23 1015 137/77 -- -- (!) 57 16 98 %  07/25/23 1000 (!) 133/55 -- -- (!) 56 15 95 %  07/25/23 0945 (!) 98/39 97.7 F (36.5 C) -- (!) 55 13 96 %     Recent laboratory studies:  Recent Labs    07/25/23 0557  WBC 6.3  HGB 14.1  HCT 42.7  PLT 318  NA 137  K 4.8  CL 103  CO2 25  BUN 23  CREATININE 1.05  GLUCOSE 92  CALCIUM 9.6  Discharge Medications:   Allergies as of 07/26/2023   No Known Allergies      Medication List     TAKE these medications    acetaminophen 325 MG tablet Commonly known as: TYLENOL Take 1-2 tablets (325-650 mg total) by mouth every 6 (six) hours as needed for mild pain (pain score 1-3) (or temp > 100.5).   amLODipine 5 MG tablet Commonly known as: NORVASC TAKE 1 TABLET BY MOUTH EVERY DAY What changed: when to take this   ammonium lactate 12 % lotion Commonly known as: AmLactin Apply 1 application topically as needed for dry skin.   aspirin 81 MG chewable tablet Chew 1 tablet (81 mg total) by mouth 2 (two) times daily.   fish oil-omega-3 fatty acids 1000 MG capsule Take 1 g by mouth in the morning.   metFORMIN 500 MG tablet Commonly known as: GLUCOPHAGE TAKE 1 TABLET BY MOUTH TWICE A DAY WITH FOOD   methocarbamol 500 MG tablet Commonly known as: ROBAXIN Take 1 tablet (500 mg  total) by mouth every 6 (six) hours as needed for muscle spasms.   multivitamin with minerals Tabs tablet Take 1 tablet by mouth in the morning.   oxyCODONE 5 MG immediate release tablet Commonly known as: Oxy IR/ROXICODONE Take 1-2 tablets (5-10 mg total) by mouth every 6 (six) hours as needed for moderate pain (pain score 4-6) (pain score 4-6).   pravastatin 40 MG tablet Commonly known as: PRAVACHOL TAKE 1 TABLET BY MOUTH EVERY DAY What changed: when to take this   sildenafil 20 MG tablet Commonly known as: REVATIO Take 3-5 tablets (60-100 mg total) by mouth daily as needed.   SUPER B COMPLEX PO Take 1 tablet by mouth in the morning.   valsartan-hydrochlorothiazide 320-25 MG tablet Commonly known as: DIOVAN-HCT TAKE 1 TABLET BY MOUTH EVERY DAY               Durable Medical Equipment  (From admission, onward)           Start     Ordered   07/25/23 1135  DME 3 n 1  Once        07/25/23 1134   07/25/23 1135  DME Walker rolling  Once       Question Answer Comment  Walker: With 5 Inch Wheels   Patient needs a walker to treat with the following condition Status post total right knee replacement      07/25/23 1134            Diagnostic Studies: DG Knee Right Port Result Date: 07/25/2023 CLINICAL DATA:  Status post right knee replacement. EXAM: PORTABLE RIGHT KNEE - 1-2 VIEW COMPARISON:  None Available. FINDINGS: Right knee arthroplasty in expected alignment. No periprosthetic lucency or fracture. Recent postsurgical change includes air and edema in the soft tissues and joint space. Anterior skin staples. IMPRESSION: Right knee arthroplasty without immediate postoperative complication. Electronically Signed   By: Narda Rutherford M.D.   On: 07/25/2023 11:30    Disposition: Discharge disposition: 06-Home-Health Care Svc          Follow-up Information     Kathryne Hitch, MD Follow up in 2 week(s).   Specialty: Orthopedic Surgery Contact  information: [address] [place] [address] [phone]         [facility] Follow up.   Specialty: Home Health Services Why: the home health agency will contact you for the first home visit Contact information: [address] [place] [address] [phone]                  Signed: Richardean Canal 07/26/2023, 8:33 AM

## 2023-07-27 ENCOUNTER — Ambulatory Visit: Payer: Medicare Other | Admitting: Internal Medicine

## 2023-07-27 DIAGNOSIS — I10 Essential (primary) hypertension: Secondary | ICD-10-CM | POA: Diagnosis not present

## 2023-07-27 DIAGNOSIS — K5900 Constipation, unspecified: Secondary | ICD-10-CM | POA: Diagnosis not present

## 2023-07-27 DIAGNOSIS — Z7984 Long term (current) use of oral hypoglycemic drugs: Secondary | ICD-10-CM | POA: Diagnosis not present

## 2023-07-27 DIAGNOSIS — Z471 Aftercare following joint replacement surgery: Secondary | ICD-10-CM | POA: Diagnosis not present

## 2023-07-27 DIAGNOSIS — Z8701 Personal history of pneumonia (recurrent): Secondary | ICD-10-CM | POA: Diagnosis not present

## 2023-07-27 DIAGNOSIS — M21372 Foot drop, left foot: Secondary | ICD-10-CM | POA: Diagnosis not present

## 2023-07-27 DIAGNOSIS — Z85820 Personal history of malignant melanoma of skin: Secondary | ICD-10-CM | POA: Diagnosis not present

## 2023-07-27 DIAGNOSIS — I358 Other nonrheumatic aortic valve disorders: Secondary | ICD-10-CM | POA: Diagnosis not present

## 2023-07-27 DIAGNOSIS — Z85828 Personal history of other malignant neoplasm of skin: Secondary | ICD-10-CM | POA: Diagnosis not present

## 2023-07-27 DIAGNOSIS — Z96651 Presence of right artificial knee joint: Secondary | ICD-10-CM | POA: Diagnosis not present

## 2023-07-27 DIAGNOSIS — R4189 Other symptoms and signs involving cognitive functions and awareness: Secondary | ICD-10-CM | POA: Diagnosis not present

## 2023-07-27 DIAGNOSIS — H539 Unspecified visual disturbance: Secondary | ICD-10-CM | POA: Diagnosis not present

## 2023-07-27 DIAGNOSIS — Z96642 Presence of left artificial hip joint: Secondary | ICD-10-CM | POA: Diagnosis not present

## 2023-07-27 DIAGNOSIS — Z9181 History of falling: Secondary | ICD-10-CM | POA: Diagnosis not present

## 2023-07-27 DIAGNOSIS — N200 Calculus of kidney: Secondary | ICD-10-CM | POA: Diagnosis not present

## 2023-07-27 DIAGNOSIS — M1990 Unspecified osteoarthritis, unspecified site: Secondary | ICD-10-CM | POA: Diagnosis not present

## 2023-07-27 DIAGNOSIS — E785 Hyperlipidemia, unspecified: Secondary | ICD-10-CM | POA: Diagnosis not present

## 2023-07-27 DIAGNOSIS — E119 Type 2 diabetes mellitus without complications: Secondary | ICD-10-CM | POA: Diagnosis not present

## 2023-07-27 DIAGNOSIS — Z8546 Personal history of malignant neoplasm of prostate: Secondary | ICD-10-CM | POA: Diagnosis not present

## 2023-07-29 DIAGNOSIS — M1990 Unspecified osteoarthritis, unspecified site: Secondary | ICD-10-CM | POA: Diagnosis not present

## 2023-07-29 DIAGNOSIS — I10 Essential (primary) hypertension: Secondary | ICD-10-CM | POA: Diagnosis not present

## 2023-07-29 DIAGNOSIS — E119 Type 2 diabetes mellitus without complications: Secondary | ICD-10-CM | POA: Diagnosis not present

## 2023-07-29 DIAGNOSIS — Z471 Aftercare following joint replacement surgery: Secondary | ICD-10-CM | POA: Diagnosis not present

## 2023-07-29 DIAGNOSIS — E785 Hyperlipidemia, unspecified: Secondary | ICD-10-CM | POA: Diagnosis not present

## 2023-07-29 DIAGNOSIS — N200 Calculus of kidney: Secondary | ICD-10-CM | POA: Diagnosis not present

## 2023-07-31 ENCOUNTER — Telehealth: Payer: Self-pay | Admitting: Orthopaedic Surgery

## 2023-07-31 ENCOUNTER — Other Ambulatory Visit: Payer: Self-pay | Admitting: Internal Medicine

## 2023-07-31 DIAGNOSIS — E119 Type 2 diabetes mellitus without complications: Secondary | ICD-10-CM | POA: Diagnosis not present

## 2023-07-31 DIAGNOSIS — N200 Calculus of kidney: Secondary | ICD-10-CM | POA: Diagnosis not present

## 2023-07-31 DIAGNOSIS — I10 Essential (primary) hypertension: Secondary | ICD-10-CM | POA: Diagnosis not present

## 2023-07-31 DIAGNOSIS — M1990 Unspecified osteoarthritis, unspecified site: Secondary | ICD-10-CM | POA: Diagnosis not present

## 2023-07-31 DIAGNOSIS — Z471 Aftercare following joint replacement surgery: Secondary | ICD-10-CM | POA: Diagnosis not present

## 2023-07-31 DIAGNOSIS — E785 Hyperlipidemia, unspecified: Secondary | ICD-10-CM | POA: Diagnosis not present

## 2023-07-31 MED ORDER — METFORMIN HCL 500 MG PO TABS: 500.0000 mg | ORAL_TABLET | Freq: Two times a day (BID) | ORAL | 3 refills | Status: AC

## 2023-07-31 NOTE — Telephone Encounter (Signed)
 Received call from Ricardo Reese (PT) with [facility] advised dressing was 75% saturated so she changed the dressing . Ricardo Reese said the dressing was lifting around the edges as well.  The number to contact Ricardo Reese is [phone]

## 2023-08-01 NOTE — Telephone Encounter (Signed)
 Verbal order to remove dressing left on VM He can replace with new dressing or guaze

## 2023-08-02 DIAGNOSIS — E119 Type 2 diabetes mellitus without complications: Secondary | ICD-10-CM | POA: Diagnosis not present

## 2023-08-02 DIAGNOSIS — M1990 Unspecified osteoarthritis, unspecified site: Secondary | ICD-10-CM | POA: Diagnosis not present

## 2023-08-02 DIAGNOSIS — N200 Calculus of kidney: Secondary | ICD-10-CM | POA: Diagnosis not present

## 2023-08-02 DIAGNOSIS — E785 Hyperlipidemia, unspecified: Secondary | ICD-10-CM | POA: Diagnosis not present

## 2023-08-02 DIAGNOSIS — I10 Essential (primary) hypertension: Secondary | ICD-10-CM | POA: Diagnosis not present

## 2023-08-02 DIAGNOSIS — Z471 Aftercare following joint replacement surgery: Secondary | ICD-10-CM | POA: Diagnosis not present

## 2023-08-03 ENCOUNTER — Other Ambulatory Visit: Payer: Self-pay | Admitting: Internal Medicine

## 2023-08-04 DIAGNOSIS — E119 Type 2 diabetes mellitus without complications: Secondary | ICD-10-CM | POA: Diagnosis not present

## 2023-08-04 DIAGNOSIS — N200 Calculus of kidney: Secondary | ICD-10-CM | POA: Diagnosis not present

## 2023-08-04 DIAGNOSIS — E785 Hyperlipidemia, unspecified: Secondary | ICD-10-CM | POA: Diagnosis not present

## 2023-08-04 DIAGNOSIS — I10 Essential (primary) hypertension: Secondary | ICD-10-CM | POA: Diagnosis not present

## 2023-08-04 DIAGNOSIS — M1990 Unspecified osteoarthritis, unspecified site: Secondary | ICD-10-CM | POA: Diagnosis not present

## 2023-08-04 DIAGNOSIS — Z471 Aftercare following joint replacement surgery: Secondary | ICD-10-CM | POA: Diagnosis not present

## 2023-08-07 ENCOUNTER — Telehealth: Payer: Self-pay | Admitting: Radiology

## 2023-08-07 ENCOUNTER — Encounter: Payer: Self-pay | Admitting: Physician Assistant

## 2023-08-07 ENCOUNTER — Ambulatory Visit (INDEPENDENT_AMBULATORY_CARE_PROVIDER_SITE_OTHER): Payer: Medicare Other | Admitting: Physician Assistant

## 2023-08-07 DIAGNOSIS — Z96651 Presence of right artificial knee joint: Secondary | ICD-10-CM

## 2023-08-07 MED ORDER — OXYCODONE HCL 5 MG PO TABS
5.0000 mg | ORAL_TABLET | Freq: Four times a day (QID) | ORAL | 0 refills | Status: DC | PRN
Start: 2023-08-07 — End: 2023-08-11

## 2023-08-07 NOTE — Addendum Note (Signed)
 Addended by: Mardene Celeste B on: 08/07/2023 11:31 AM   Modules accepted: Orders

## 2023-08-07 NOTE — Telephone Encounter (Signed)
 Placed PT order on patient. Patient requests to be scheduled with Arlys John. I had already placed order and could not update note.  Thank you.

## 2023-08-07 NOTE — Progress Notes (Signed)
 HPI: [name] returns today status post right total knee arthroplasty 07/25/2023.  He states overall he is doing well.  Ranks pain to be 8 out of 10 at worst.  He has been somewhat reluctant to take any oxycodone due to constipation.  He has been taking Tylenol mostly.  He is taking Robaxin 3 times daily.  He is on aspirin 81 mg twice daily for DVT prophylaxis.  Physical exam: General Well-developed well-nourished male no acute distress mood and affect appropriate Right knee surgical incisions healing well well-approximated with staples.  Staples were harvested today.  No signs of infection.  Right calf supple nontender.  Right knee -2 to 85 degrees of flexion.  Dorsiflexion plantarflexion right ankle intact.  Ambulates with a walker.  Impression: Status post right total knee arthroplasty  Plan: He will work on range of motion and strengthening.  Scar tissue mobilization encouraged.  He will follow-up with [place] in 1 month sooner if there is any questions concerns or

## 2023-08-08 ENCOUNTER — Ambulatory Visit (INDEPENDENT_AMBULATORY_CARE_PROVIDER_SITE_OTHER): Payer: Medicare Other | Admitting: Physical Therapy

## 2023-08-08 ENCOUNTER — Telehealth: Payer: Self-pay | Admitting: Physician Assistant

## 2023-08-08 ENCOUNTER — Encounter: Payer: Self-pay | Admitting: Physical Therapy

## 2023-08-08 DIAGNOSIS — R600 Localized edema: Secondary | ICD-10-CM

## 2023-08-08 DIAGNOSIS — M6281 Muscle weakness (generalized): Secondary | ICD-10-CM

## 2023-08-08 DIAGNOSIS — M25561 Pain in right knee: Secondary | ICD-10-CM | POA: Diagnosis not present

## 2023-08-08 DIAGNOSIS — R262 Difficulty in walking, not elsewhere classified: Secondary | ICD-10-CM

## 2023-08-08 DIAGNOSIS — M25661 Stiffness of right knee, not elsewhere classified: Secondary | ICD-10-CM | POA: Diagnosis not present

## 2023-08-08 NOTE — Therapy (Signed)
 OUTPATIENT PHYSICAL THERAPY LOWER EXTREMITY EVALUATION   Patient Name: Ricardo Reese MRN: [id number] DOB:07-28-1950, 73 y.o., male Today's Date: 08/08/2023  END OF SESSION:  PT End of Session - 08/08/23 1624     Visit Number 1    Number of Visits 20    Date for PT Re-Evaluation 10/17/23    Authorization Type MCR and BCBS    PT Start Time 1555    PT Stop Time 1635    PT Time Calculation (min) 40 min    Activity Tolerance Patient tolerated treatment well    Behavior During Therapy WFL for tasks assessed/performed             Past Medical History:  Diagnosis Date   Diabetes mellitus without complication (HCC)    History of kidney stones    has had twice   Hyperlipidemia    Hypertension    Impaired fasting glucose    Kidney stone 1983   Melanoma of back [facility]) 2013   Dr Adolphus Birchwood   Pneumonia    walking back in 2002   Prostate cancer (HCC) 2019   Skin cancer 06/14/2011   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   Past Surgical History:  Procedure Laterality Date   COLONOSCOPY     KNEE SURGERY Right 1969   cartilage removal right knee    melanoma back  09/2010   PROSTATE BIOPSY     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   Procedure: LEFT TOTAL HIP ARTHROPLASTY ANTERIOR APPROACH;  Surgeon: Kathryne Hitch, MD;  Location: [facility] OR;  Service: Orthopedics;  Laterality: Left;   TOTAL KNEE ARTHROPLASTY Right 07/25/2023   Procedure: RIGHT TOTAL KNEE ARTHROPLASTY;  Surgeon: Kathryne Hitch, MD;  Location: [facility] OR;  Service: Orthopedics;  Laterality: Right;   Patient Active Problem List   Diagnosis Date Noted   OA (osteoarthritis) of knee 07/25/2023   Status post total right knee replacement 07/25/2023   Unilateral primary osteoarthritis, right knee 05/29/2023   Cognitive changes 01/24/2023   Left foot drop 12/31/2021   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   Right knee pain 10/25/2019   History of prostate cancer 01/02/2018    Advance directive discussed with patient 09/26/2016   Status post left hip replacement 09/20/2016   Routine general medical examination at a health care facility 08/17/2011   History of melanoma    Hyperlipemia 05/20/2008   Benign essential hypertension 05/20/2008   Type 2 diabetes mellitus with other circulatory complications (HCC) 05/20/2008    PCP: Karie Schwalbe, MD   REFERRING PROVIDER: Kirtland Bouchard, PA-C   REFERRING DIAG: [phone] (ICD-10-CM) - Status post total right knee replacement   THERAPY DIAG:  Acute pain of right knee  Muscle weakness (generalized)  Difficulty in walking, not elsewhere classified  Localized edema  Stiffness of right knee, not elsewhere classified  Rationale for Evaluation and Treatment: Rehabilitation  ONSET DATE: status post right total knee arthroplasty 07/25/2023   SUBJECTIVE:   SUBJECTIVE STATEMENT: He had knee replaced and has been working hard on HEP that HHPT gave him. He is having knee pain and is also having difficulty with standing and walking  PERTINENT HISTORY: Post op  PAIN:  NPRS scale: 6/10 upon arrival Pain location:Rt anteriror  Pain description: constant, achy, sharp Aggravating factors: walking, shower, sleeping on back Relieving factors: rest, meds   PRECAUTIONS: None  RED FLAGS: None   WEIGHT BEARING RESTRICTIONS: No  FALLS:  Has patient fallen  in last 6 months? No  LIVING ENVIRONMENT: Has 15 stairs at home, reports he can do these already without much difficulty  OCCUPATION: retired but works part time at [employer]  PLOF: Independent  PATIENT GOALS: reduce pain, get back to normal and get back to golf  NEXT MD VISIT: 09/04/23  OBJECTIVE:  Note: Objective measures were completed at Evaluation unless otherwise noted.  PATIENT SURVEYS:  Eval Patient specific functional scale (0 unable to 10 no difficulty) 1)showering 2/10 2)walking without walker 1/10 Total 3/20, predicted goal  >18/20   COGNITION: Overall cognitive status: Within functional limits for tasks assessed     EDEMA:  Moderate edema Right knee  LOWER EXTREMITY ROM:  AROM/ Passive ROM Right eval Left eval  Hip flexion    Hip extension    Hip abduction    Hip adduction    Hip internal rotation    Hip external rotation    Knee flexion 75/80   Knee extension 10/8   Ankle dorsiflexion    Ankle plantarflexion    Ankle inversion    Ankle eversion     (Blank rows = not tested)  LOWER EXTREMITY MMT:  MMT in sitting Right eval Left eval  Hip flexion 4   Hip extension    Hip abduction 4   Hip adduction    Hip internal rotation    Hip external rotation    Knee flexion 4   Knee extension 4   Ankle dorsiflexion    Ankle plantarflexion    Ankle inversion    Ankle eversion     (Blank rows = not tested)  FUNCTIONAL TESTS:  Eval: Sit to stand: unable to perform without UE support  GAIT: Distance walked: 100 Assistive device utilized: Environmental consultant - 2 wheeled Level of assistance: Modified independence Comments: good stability with this but poor without RW, and does not use AD PLOF                                                                                                                                TODAY'S TREATMENT:  Eval HEP creation and review with demonstration and trial set preformed, see below for details Nu step L5 X 10 min UE/LE for knee ROM Gastroc stretch on slantboard 30 sec X 3 Manual therapy: Rt knee PROM with overpressure to tolerance, manual hamstring stretching    PATIENT EDUCATION: Education details: HEP, PT plan of care Person educated: Patient Education method: Explanation, Demonstration, Verbal cues, and Handouts Education comprehension: verbalized understanding and needs further education   HOME EXERCISE PROGRAM: Access Code: [email or web address] URL: [email or web address] Date: 08/08/2023 Prepared by: Ivery Quale  Exercises - Supine Heel  Slide with Strap  - 2 x daily - 6 x weekly - 1 sets - 10 reps - 5 hold - Seated Long Arc Quad  - 2 x daily - 6 x weekly - 2 sets - 10 reps - 3 sec hold - Seated Knee  Flexion Stretch  - 2 x daily - 6 x weekly - 1 sets - 10 reps - 5 sec hold - Seated Straight Leg Raise with Quad Contraction  - 2 x daily - 6 x weekly - 2 sets - 10 reps - Seated Hamstring Stretch  - 2 x daily - 6 x weekly - 1 sets - 3 reps - 30 hold - Sit to Stand  - 2 x daily - 6 x weekly - 1-2 sets - 10 reps  ASSESSMENT:  CLINICAL IMPRESSION:73 y.o. male presents to PT for eval and treat for for R TKA 07/25/2023.  He is doing well up to this point but does have increased knee stiffness which will be early focus of PT. Patient will benefit from skilled PT to address below impairments, limitations and improve overall function.  OBJECTIVE IMPAIRMENTS: decreased activity tolerance, difficulty walking, decreased balance, decreased endurance, decreased mobility, decreased ROM, decreased strength, impaired flexibility, impaired LE use, and pain.  ACTIVITY LIMITATIONS: bending, lifting, carry, locomotion, cleaning, community activity, driving, and or occupation  PERSONAL FACTORS: see PMH, are also affecting patient's functional outcome.  REHAB POTENTIAL: Good  CLINICAL DECISION MAKING: Stable/uncomplicated  EVALUATION COMPLEXITY: Low    GOALS: Short term PT Goals Target date: 09/05/2023   Pt will be I and compliant with HEP. Baseline:  Goal status: New Pt will decrease pain to less than 4/10 on average Baseline: Goal status: New  Long term PT goals Target date:10/17/2023   Pt will improve Rt knee AROM 3-110 deg  to improve functional mobility Baseline: Goal status: New Pt will improve  hip/knee strength to at least 5-/5 MMT to improve functional strength Baseline: Goal status: New Pt will improve PSFS to at least 18/20 to show improved function Baseline: Goal status: New Pt will reduce pain to overall less than  2-3/10 with usual activity, work activity and return to golf Baseline: Goal status: New Pt will be able to ambulate community distances at least 1000 ft WNL gait pattern without complaints Baseline: Goal status: New  PLAN: PT FREQUENCY: 1-3 times per week   PT DURATION: 6-10 weeks  PLANNED INTERVENTIONS (unless contraindicated): aquatic PT, Canalith repositioning, cryotherapy, Electrical stimulation, Iontophoresis with 4 mg/ml dexamethasome, Moist heat, traction, Ultrasound, gait training, Therapeutic exercise, balance training, neuromuscular re-education, patient/family education, manual techniques, passive ROM, dry needling, taping, vasopnuematic device, vestibular, spinal manipulations, joint manipulations 97110-Therapeutic exercises, 97530- Therapeutic activity, 97112- Neuromuscular re-education, 97535- Self Care, and 11914- Manual therapy  PLAN FOR NEXT SESSION: ROM focus and wean off RW as able.    April Manson, PT,DPT 08/08/2023, 4:26 PM

## 2023-08-08 NOTE — Telephone Encounter (Signed)
 Pt came into office to request a prescription of oxycodone for pain. Pt stated he went to his pharmacy yesterday and they told him they sent the prescription back for an alternative and the pt hasn't heard back since. Pt uses [facility] on  [address] [place], [place] [address]

## 2023-08-09 NOTE — Telephone Encounter (Signed)
 Left voicemail, Rx was sent in.

## 2023-08-11 ENCOUNTER — Other Ambulatory Visit: Payer: Self-pay | Admitting: Orthopaedic Surgery

## 2023-08-11 ENCOUNTER — Encounter: Payer: Self-pay | Admitting: Rehabilitative and Restorative Service Providers"

## 2023-08-11 ENCOUNTER — Telehealth: Payer: Self-pay | Admitting: Physician Assistant

## 2023-08-11 ENCOUNTER — Ambulatory Visit (INDEPENDENT_AMBULATORY_CARE_PROVIDER_SITE_OTHER): Payer: Medicare Other | Admitting: Rehabilitative and Restorative Service Providers"

## 2023-08-11 DIAGNOSIS — R600 Localized edema: Secondary | ICD-10-CM | POA: Diagnosis not present

## 2023-08-11 DIAGNOSIS — M25661 Stiffness of right knee, not elsewhere classified: Secondary | ICD-10-CM

## 2023-08-11 DIAGNOSIS — R262 Difficulty in walking, not elsewhere classified: Secondary | ICD-10-CM

## 2023-08-11 DIAGNOSIS — M25561 Pain in right knee: Secondary | ICD-10-CM

## 2023-08-11 DIAGNOSIS — M6281 Muscle weakness (generalized): Secondary | ICD-10-CM

## 2023-08-11 MED ORDER — OXYCODONE HCL 5 MG PO TABS: 5.0000 mg | ORAL_TABLET | Freq: Four times a day (QID) | ORAL | 0 refills | Status: DC | PRN

## 2023-08-11 NOTE — Therapy (Signed)
 OUTPATIENT PHYSICAL THERAPY LOWER EXTREMITY TREATMENT   Patient Name: Ricardo Reese MRN: [id number] DOB:03-14-1951, 73 y.o., male Today's Date: 08/11/2023  END OF SESSION:  PT End of Session - 08/11/23 0852     Visit Number 2    Number of Visits 20    Date for PT Re-Evaluation 10/17/23    Authorization Type MCR and BCBS    PT Start Time 0847    PT Stop Time 0940    PT Time Calculation (min) 53 min    Activity Tolerance Patient tolerated treatment well;No increased pain;Patient limited by pain    Behavior During Therapy [facility] for tasks assessed/performed              Past Medical History:  Diagnosis Date   Diabetes mellitus without complication (HCC)    History of kidney stones    has had twice   Hyperlipidemia    Hypertension    Impaired fasting glucose    Kidney stone 1983   Melanoma of back [facility]) 2013   Dr Adolphus Birchwood   Pneumonia    walking back in 2002   Prostate cancer (HCC) 2019   Skin cancer 06/14/2011   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   Past Surgical History:  Procedure Laterality Date   COLONOSCOPY     KNEE SURGERY Right 1969   cartilage removal right knee    melanoma back  09/2010   PROSTATE BIOPSY     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   Procedure: LEFT TOTAL HIP ARTHROPLASTY ANTERIOR APPROACH;  Surgeon: Kathryne Hitch, MD;  Location: [facility] OR;  Service: Orthopedics;  Laterality: Left;   TOTAL KNEE ARTHROPLASTY Right 07/25/2023   Procedure: RIGHT TOTAL KNEE ARTHROPLASTY;  Surgeon: Kathryne Hitch, MD;  Location: [facility] OR;  Service: Orthopedics;  Laterality: Right;   Patient Active Problem List   Diagnosis Date Noted   OA (osteoarthritis) of knee 07/25/2023   Status post total right knee replacement 07/25/2023   Unilateral primary osteoarthritis, right knee 05/29/2023   Cognitive changes 01/24/2023   Left foot drop 12/31/2021   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   Right knee pain 10/25/2019    History of prostate cancer 01/02/2018   Advance directive discussed with patient 09/26/2016   Status post left hip replacement 09/20/2016   Routine general medical examination at a health care facility 08/17/2011   History of melanoma    Hyperlipemia 05/20/2008   Benign essential hypertension 05/20/2008   Type 2 diabetes mellitus with other circulatory complications (HCC) 05/20/2008    PCP: Karie Schwalbe, MD   REFERRING PROVIDER: Kathryne Hitch*   REFERRING DIAG: X52.841 (ICD-10-CM) - Status post total right knee replacement   THERAPY DIAG:  Acute pain of right knee  Muscle weakness (generalized)  Difficulty in walking, not elsewhere classified  Localized edema  Stiffness of right knee, not elsewhere classified  Rationale for Evaluation and Treatment: Rehabilitation  ONSET DATE: status post right total knee arthroplasty 07/25/2023   SUBJECTIVE:   SUBJECTIVE STATEMENT: Kyriakos notes he is sleeping well and he is weaning off his prescription pain medication.  PERTINENT HISTORY: Post op  PAIN:  NPRS scale: 0-4/10 typically (can be higher with exercises) Pain location: Rt anteriror  Pain description: constant, achy, can be sharp Aggravating factors: walking, shower, sleeping on back Relieving factors: rest, meds   PRECAUTIONS: None  RED FLAGS: None   WEIGHT BEARING RESTRICTIONS: No  FALLS:  Has patient fallen in last  6 months? No  LIVING ENVIRONMENT: Has 15 stairs at home, reports he can do these already without much difficulty  OCCUPATION: retired but works part time at golf [employer]  PLOF: Independent  PATIENT GOALS: reduce pain, get back to normal and get back to golf  NEXT MD VISIT: 09/04/23  OBJECTIVE:  Note: Objective measures were completed at Evaluation unless otherwise noted.  PATIENT SURVEYS:  Eval Patient specific functional scale (0 unable to 10 no difficulty) 1)showering 2/10 2)walking without walker 1/10 Total 3/20,  predicted goal >18/20   COGNITION: Overall cognitive status: Within functional limits for tasks assessed     EDEMA:  Moderate edema Right knee  LOWER EXTREMITY ROM:  AROM/ Passive ROM Right eval Left eval  Hip flexion    Hip extension    Hip abduction    Hip adduction    Hip internal rotation    Hip external rotation    Knee flexion 75/80   Knee extension 10/8   Ankle dorsiflexion    Ankle plantarflexion    Ankle inversion    Ankle eversion     (Blank rows = not tested)  LOWER EXTREMITY MMT:  MMT in sitting Right eval Left eval  Hip flexion 4   Hip extension    Hip abduction 4   Hip adduction    Hip internal rotation    Hip external rotation    Knee flexion 4   Knee extension 4   Ankle dorsiflexion    Ankle plantarflexion    Ankle inversion    Ankle eversion     (Blank rows = not tested)  FUNCTIONAL TESTS:  Eval: Sit to stand: unable to perform without UE support  GAIT: Distance walked: 100 Assistive device utilized: Environmental consultant - 2 wheeled Level of assistance: Modified independence Comments: good stability with this but poor without RW, and does not use AD PLOF                                                                                                                                TODAY'S TREATMENT:  08/11/2023 Recumbent bike AAROM Seat 7 for 5 minutes AAROM Seated hamstrings stretch 2 x 20 seconds Seated straight leg raises 2 sets of 5 (quad set and pause 2 seconds before lifting) Seated LAQ 10 x 3 seconds Seated AAROM knee flexion 5 x 10 seconds Verbally reviewed supine knee flexion with strap  Functional Activities: Double Leg Press full extension and stretch into flexion 15 x slow eccentrics 50# Single Leg Press full extension and stretch into flexion 10 x slow eccentrics 25# Sit to stand 5 x with slow eccentric's  97535: Activities for sequencing with assistive device in parallel bars, discussed correct cane height at wrist watch level   Tandem balance 4 x 20 seconds   Vaso Medium right knee 10 minutes Medium 34*   Eval HEP creation and review with demonstration and trial set preformed, see below for details Nu  step L5 X 10 min UE/LE for knee ROM Gastroc stretch on slantboard 30 sec X 3 Manual therapy: Rt knee PROM with overpressure to tolerance, manual hamstring stretching   PATIENT EDUCATION: Education details: HEP, PT plan of care Person educated: Patient Education method: Explanation, Demonstration, Verbal cues, and Handouts Education comprehension: verbalized understanding and needs further education   HOME EXERCISE PROGRAM: Access Code: [email or web address] URL: [email or web address] Date: 08/11/2023 Prepared by: Pauletta Browns  Exercises - Supine Heel Slide with Strap  - 2 x daily - 6 x weekly - 1 sets - 10 reps - 5 hold - Seated Long Arc Quad  - 2 x daily - 6 x weekly - 2 sets - 10 reps - 3 sec hold - Seated Knee Flexion Stretch  - 2 x daily - 6 x weekly - 1 sets - 10 reps - 5 sec hold - Seated Straight Leg Raise with Quad Contraction  - 2 x daily - 6 x weekly - 2 sets - 10 reps - Seated Hamstring Stretch  - 2 x daily - 6 x weekly - 1 sets - 3 reps - 30 hold - Sit to Stand  - 2 x daily - 6 x weekly - 1-2 sets - 10 reps - Tandem Stance  - 1 x daily - 7 x weekly - 1 sets - 5 reps - 20 second hold   ASSESSMENT:  CLINICAL IMPRESSION: Cort reports good compliance with his day 1 home exercise program.  He was able to demonstrate mostly correct technique with his starter home exercise program.  Corrections were given, particularly in regards to quadriceps activation.  Knee flexion active range of motion, quadriceps strength and edema control remain priorities with Lola's early rehabilitation.  73 y.o. male presents to PT for eval and treat for for R TKA 07/25/2023.  He is doing well up to this point but does have increased knee stiffness which will be early focus of PT. Patient will benefit from skilled  PT to address below impairments, limitations and improve overall function.  OBJECTIVE IMPAIRMENTS: decreased activity tolerance, difficulty walking, decreased balance, decreased endurance, decreased mobility, decreased ROM, decreased strength, impaired flexibility, impaired LE use, and pain.  ACTIVITY LIMITATIONS: bending, lifting, carry, locomotion, cleaning, community activity, driving, and or occupation  PERSONAL FACTORS: see PMH, are also affecting patient's functional outcome.  REHAB POTENTIAL: Good  CLINICAL DECISION MAKING: Stable/uncomplicated  EVALUATION COMPLEXITY: Low    GOALS: Short term PT Goals Target date: 09/05/2023   Pt will be I and compliant with HEP. Baseline:  Goal status: On Going 08/11/2023 Pt will decrease pain to less than 4/10 on average Baseline: Goal status: On Going 08/11/2023  Long term PT goals Target date:10/17/2023   Pt will improve Rt knee AROM 3-110 deg  to improve functional mobility Baseline: Goal status: New Pt will improve  hip/knee strength to at least 5-/5 MMT to improve functional strength Baseline: Goal status: New Pt will improve PSFS to at least 18/20 to show improved function Baseline: Goal status: New Pt will reduce pain to overall less than 2-3/10 with usual activity, work activity and return to golf Baseline: Goal status: New Pt will be able to ambulate community distances at least 1000 ft WNL gait pattern without complaints Baseline: Goal status: New  PLAN: PT FREQUENCY: 1-3 times per week   PT DURATION: 6-10 weeks  PLANNED INTERVENTIONS (unless contraindicated): aquatic PT, Canalith repositioning, cryotherapy, Electrical stimulation, Iontophoresis with 4 mg/ml dexamethasome, Moist heat, traction, Ultrasound, gait training, Therapeutic exercise, balance  training, neuromuscular re-education, patient/family education, manual techniques, passive ROM, dry needling, taping, vasopnuematic device, vestibular, spinal  manipulations, joint manipulations 97110-Therapeutic exercises, 97530- Therapeutic activity, 97112- Neuromuscular re-education, 97535- Self Care, and 75643- Manual therapy  PLAN FOR NEXT SESSION: ROM, quadriceps strengthening and progressions to get [facility] comfortable with a cane from the wheeled walker.    Cherlyn Cushing, PT, MPT 08/11/2023, 5:10 PM

## 2023-08-11 NOTE — Telephone Encounter (Signed)
 Pt spouse stated that the medication Bronson Curb prescribed Monday, the pharmacy stated they would have had to pay out of pocket in order to pick it up, pt declined that option so the pharmacy informed the pt that they will reach out to [place] to request a medication the pt insurance will cover. Pt is in office requesting the same thing. A medication that the insurance will cover. Best call back [phone]

## 2023-08-11 NOTE — Telephone Encounter (Signed)
 Called pharmacy - Rx needed prior auth & fax did not go through. Pharmacy is resending.   I have called and updated patient/spouse.

## 2023-08-14 ENCOUNTER — Encounter: Payer: Medicare Other | Admitting: Rehabilitative and Restorative Service Providers"

## 2023-08-16 ENCOUNTER — Ambulatory Visit (INDEPENDENT_AMBULATORY_CARE_PROVIDER_SITE_OTHER): Payer: Medicare Other | Admitting: Physical Therapy

## 2023-08-16 ENCOUNTER — Encounter: Payer: Self-pay | Admitting: Physical Therapy

## 2023-08-16 DIAGNOSIS — M25661 Stiffness of right knee, not elsewhere classified: Secondary | ICD-10-CM

## 2023-08-16 DIAGNOSIS — R262 Difficulty in walking, not elsewhere classified: Secondary | ICD-10-CM

## 2023-08-16 DIAGNOSIS — M25561 Pain in right knee: Secondary | ICD-10-CM

## 2023-08-16 DIAGNOSIS — R600 Localized edema: Secondary | ICD-10-CM

## 2023-08-16 DIAGNOSIS — M6281 Muscle weakness (generalized): Secondary | ICD-10-CM | POA: Diagnosis not present

## 2023-08-16 NOTE — Therapy (Signed)
 OUTPATIENT PHYSICAL THERAPY LOWER EXTREMITY TREATMENT   Patient Name: Ricardo Reese MRN: [id number] DOB:July 07, 1950, 73 y.o., male Today's Date: 08/16/2023  END OF SESSION:  PT End of Session - 08/16/23 0810     Visit Number 3    Number of Visits 20    Date for PT Re-Evaluation 10/17/23    Authorization Type MCR and BCBS    PT Start Time 0806    PT Stop Time 0855    PT Time Calculation (min) 49 min    Activity Tolerance Patient tolerated treatment well;No increased pain;Patient limited by pain    Behavior During Therapy [facility] for tasks assessed/performed               Past Medical History:  Diagnosis Date   Diabetes mellitus without complication (HCC)    History of kidney stones    has had twice   Hyperlipidemia    Hypertension    Impaired fasting glucose    Kidney stone 1983   Melanoma of back [facility]) 2013   Dr Adolphus Birchwood   Pneumonia    walking back in 2002   Prostate cancer (HCC) 2019   Skin cancer 06/14/2011   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   Past Surgical History:  Procedure Laterality Date   COLONOSCOPY     KNEE SURGERY Right 1969   cartilage removal right knee    melanoma back  09/2010   PROSTATE BIOPSY     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   Procedure: LEFT TOTAL HIP ARTHROPLASTY ANTERIOR APPROACH;  Surgeon: Kathryne Hitch, MD;  Location: [facility] OR;  Service: Orthopedics;  Laterality: Left;   TOTAL KNEE ARTHROPLASTY Right 07/25/2023   Procedure: RIGHT TOTAL KNEE ARTHROPLASTY;  Surgeon: Kathryne Hitch, MD;  Location: [facility] OR;  Service: Orthopedics;  Laterality: Right;   Patient Active Problem List   Diagnosis Date Noted   OA (osteoarthritis) of knee 07/25/2023   Status post total right knee replacement 07/25/2023   Unilateral primary osteoarthritis, right knee 05/29/2023   Cognitive changes 01/24/2023   Left foot drop 12/31/2021   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   Right knee pain 10/25/2019    History of prostate cancer 01/02/2018   Advance directive discussed with patient 09/26/2016   Status post left hip replacement 09/20/2016   Routine general medical examination at a health care facility 08/17/2011   History of melanoma    Hyperlipemia 05/20/2008   Benign essential hypertension 05/20/2008   Type 2 diabetes mellitus with other circulatory complications (HCC) 05/20/2008    PCP: Karie Schwalbe, MD   REFERRING PROVIDER: Kathryne Hitch*   REFERRING DIAG: Q65.784 (ICD-10-CM) - Status post total right knee replacement   THERAPY DIAG:  Acute pain of right knee  Muscle weakness (generalized)  Difficulty in walking, not elsewhere classified  Localized edema  Stiffness of right knee, not elsewhere classified  Rationale for Evaluation and Treatment: Rehabilitation  ONSET DATE: status post right total knee arthroplasty 07/25/2023   SUBJECTIVE:   SUBJECTIVE STATEMENT: He is doing his exercises. He is walking a lot and does stairs but protective method.   PERTINENT HISTORY: Post op  PAIN:  NPRS scale: today 1/10 and since last PT  0-2/10 typically (can be higher with exercises) Pain location: Rt anteriror  Pain description: constant, achy, can be sharp Aggravating factors: walking, shower, sleeping on back Relieving factors: rest, meds  PRECAUTIONS: None  RED FLAGS: None   WEIGHT BEARING RESTRICTIONS:  No  FALLS:  Has patient fallen in last 6 months? No  LIVING ENVIRONMENT: Has 15 stairs at home, reports he can do these already without much difficulty  OCCUPATION: retired but works part time at [employer]  PLOF: Independent  PATIENT GOALS: reduce pain, get back to normal and get back to golf  NEXT MD VISIT: 09/04/23  OBJECTIVE:  Note: Objective measures were completed at Evaluation unless otherwise noted.  PATIENT SURVEYS:  Eval Patient specific functional scale (0 unable to 10 no difficulty) 1)showering 2/10 2)walking without  walker 1/10 Total 3/20, predicted goal >18/20   COGNITION: Overall cognitive status: Within functional limits for tasks assessed    EDEMA:  Moderate edema Right knee  LOWER EXTREMITY ROM:  AROM/ Passive ROM Right eval Right 08/16/23  Hip flexion    Hip extension    Hip abduction    Hip adduction    Hip internal rotation    Hip external rotation    Knee flexion 75/80 80/84  Knee extension 10/8   Ankle dorsiflexion    Ankle plantarflexion    Ankle inversion    Ankle eversion     (Blank rows = not tested)  LOWER EXTREMITY MMT:  MMT in sitting Right eval Left eval  Hip flexion 4   Hip extension    Hip abduction 4   Hip adduction    Hip internal rotation    Hip external rotation    Knee flexion 4   Knee extension 4   Ankle dorsiflexion    Ankle plantarflexion    Ankle inversion    Ankle eversion     (Blank rows = not tested)  FUNCTIONAL TESTS:  Eval: Sit to stand: unable to perform without UE support  GAIT: Distance walked: 100 Assistive device utilized: Environmental consultant - 2 wheeled Level of assistance: Modified independence Comments: good stability with this but poor without RW, and does not use AD PLOF                                                                                                                                TODAY'S TREATMENT:  08/16/2023 Therapeutic Exercises: Hamstring stretch with strap during rest on leg press - 30 sec hold 2 reps SciFit bike seat 12 rocking flexion stretch for 2 min then full revolutions level 1 6 min RLE Seated heel slide on pillow case ext / quad set 5 sec, then active knee flexion with end range assist with LLE 5 sec hold 10 reps  Therapeutic Activities: Pt amb with cane safely throughout session.  Leg press BLEs 81# 10 reps 3 sets with knee flexion stretch 5 sec PT demo & verbal cues on elevation higher than heart >/= 2x/day for >/= 15 min with ankle alphabet for muscle activity.  Pt verbalized understanding.  Weight  shift onto RLE 3-5 sec hold upon arising prior to ambulating.  Pt return demo with less pain initiating gait.   Manual  Therapy PROM knee flexion with overpressure grade IV mob Contract-relax for knee flexion seated.  Vaso 34* with elevation ext stretch medium compression 10 min    TREATMENT:  08/11/2023 Recumbent bike AAROM Seat 7 for 5 minutes AAROM Seated hamstrings stretch 2 x 20 seconds Seated straight leg raises 2 sets of 5 (quad set and pause 2 seconds before lifting) Seated LAQ 10 x 3 seconds Seated AAROM knee flexion 5 x 10 seconds Verbally reviewed supine knee flexion with strap  Functional Activities: Double Leg Press full extension and stretch into flexion 15 x slow eccentrics 50# Single Leg Press full extension and stretch into flexion 10 x slow eccentrics 25# Sit to stand 5 x with slow eccentric's  97535: Activities for sequencing with assistive device in parallel bars, discussed correct cane height at wrist watch level  Tandem balance 4 x 20 seconds   Vaso Medium right knee 10 minutes Medium 34*   Eval HEP creation and review with demonstration and trial set preformed, see below for details Nu step L5 X 10 min UE/LE for knee ROM Gastroc stretch on slantboard 30 sec X 3 Manual therapy: Rt knee PROM with overpressure to tolerance, manual hamstring stretching   PATIENT EDUCATION: Education details: HEP, PT plan of care Person educated: Patient Education method: Explanation, Demonstration, Verbal cues, and Handouts Education comprehension: verbalized understanding and needs further education   HOME EXERCISE PROGRAM: Access Code: [email or web address] URL: [email or web address] Date: 08/11/2023 Prepared by: Pauletta Browns  Exercises - Supine Heel Slide with Strap  - 2 x daily - 6 x weekly - 1 sets - 10 reps - 5 hold - Seated Long Arc Quad  - 2 x daily - 6 x weekly - 2 sets - 10 reps - 3 sec hold - Seated Knee Flexion Stretch  - 2 x daily - 6 x weekly - 1  sets - 10 reps - 5 sec hold - Seated Straight Leg Raise with Quad Contraction  - 2 x daily - 6 x weekly - 2 sets - 10 reps - Seated Hamstring Stretch  - 2 x daily - 6 x weekly - 1 sets - 3 reps - 30 hold - Sit to Stand  - 2 x daily - 6 x weekly - 1-2 sets - 10 reps - Tandem Stance  - 1 x daily - 7 x weekly - 1 sets - 5 reps - 20 second hold   ASSESSMENT:  CLINICAL IMPRESSION: Patient appears to have better understanding for exercising / moving knee throughout his awake hours.  He had improved range after exercise and manual therapy.  He continues to benefit from skilled PT.   Eval:  73 y.o. male presents to PT for eval and treat for for R TKA 07/25/2023.  He is doing well up to this point but does have increased knee stiffness which will be early focus of PT. Patient will benefit from skilled PT to address below impairments, limitations and improve overall function.  OBJECTIVE IMPAIRMENTS: decreased activity tolerance, difficulty walking, decreased balance, decreased endurance, decreased mobility, decreased ROM, decreased strength, impaired flexibility, impaired LE use, and pain.  ACTIVITY LIMITATIONS: bending, lifting, carry, locomotion, cleaning, community activity, driving, and or occupation  PERSONAL FACTORS: see PMH, are also affecting patient's functional outcome.  REHAB POTENTIAL: Good  CLINICAL DECISION MAKING: Stable/uncomplicated  EVALUATION COMPLEXITY: Low    GOALS: Short term PT Goals Target date: 09/05/2023   Pt will be I and compliant with HEP. Baseline:  Goal status: Ongoing  08/16/2023 Pt  will decrease pain to less than 4/10 on average Baseline: Goal status:  Ongoing  08/16/2023  Long term PT goals Target date:10/17/2023   Pt will improve Rt knee AROM 3-110 deg  to improve functional mobility Baseline: Goal status: Ongoing  08/16/2023 Pt will improve  hip/knee strength to at least 5-/5 MMT to improve functional strength Baseline: Goal status: Ongoing  08/16/2023 Pt  will improve PSFS to at least 18/20 to show improved function Baseline: Goal status: Ongoing  08/16/2023 Pt will reduce pain to overall less than 2-3/10 with usual activity, work activity and return to golf Baseline: Goal status:  Ongoing  08/16/2023 Pt will be able to ambulate community distances at least 1000 ft WNL gait pattern without complaints Baseline: Goal status: Ongoing  08/16/2023  PLAN: PT FREQUENCY: 1-3 times per week   PT DURATION: 6-10 weeks  PLANNED INTERVENTIONS (unless contraindicated): aquatic PT, Canalith repositioning, cryotherapy, Electrical stimulation, Iontophoresis with 4 mg/ml dexamethasome, Moist heat, traction, Ultrasound, gait training, Therapeutic exercise, balance training, neuromuscular re-education, patient/family education, manual techniques, passive ROM, dry needling, taping, vasopnuematic device, vestibular, spinal manipulations, joint manipulations 97110-Therapeutic exercises, 97530- Therapeutic activity, 97112- Neuromuscular re-education, 97535- Self Care, and 09811- Manual therapy  PLAN FOR NEXT SESSION: continue exercise and manual therapy for ROM, quadriceps strengthening and progressions.    Vladimir Faster, PT, DPT 08/16/2023, 8:57 AM

## 2023-08-17 ENCOUNTER — Encounter: Payer: Medicare Other | Admitting: Rehabilitative and Restorative Service Providers"

## 2023-08-22 ENCOUNTER — Encounter: Payer: Self-pay | Admitting: Physical Therapy

## 2023-08-22 ENCOUNTER — Ambulatory Visit (INDEPENDENT_AMBULATORY_CARE_PROVIDER_SITE_OTHER): Payer: Medicare Other | Admitting: Physical Therapy

## 2023-08-22 DIAGNOSIS — M25661 Stiffness of right knee, not elsewhere classified: Secondary | ICD-10-CM

## 2023-08-22 DIAGNOSIS — R262 Difficulty in walking, not elsewhere classified: Secondary | ICD-10-CM

## 2023-08-22 DIAGNOSIS — M25561 Pain in right knee: Secondary | ICD-10-CM | POA: Diagnosis not present

## 2023-08-22 DIAGNOSIS — M6281 Muscle weakness (generalized): Secondary | ICD-10-CM | POA: Diagnosis not present

## 2023-08-22 DIAGNOSIS — R600 Localized edema: Secondary | ICD-10-CM | POA: Diagnosis not present

## 2023-08-22 NOTE — Therapy (Signed)
 OUTPATIENT PHYSICAL THERAPY LOWER EXTREMITY TREATMENT   Patient Name: Ricardo Reese MRN: [id number] DOB:1951/05/01, 73 y.o., male Today's Date: 08/22/2023  END OF SESSION:  PT End of Session - 08/22/23 4098     Visit Number 4    Number of Visits 20    Date for PT Re-Evaluation 10/17/23    Authorization Type MCR and BCBS    PT Start Time 0845    PT Stop Time 0930    PT Time Calculation (min) 45 min    Activity Tolerance Patient tolerated treatment well;No increased pain    Behavior During Therapy WFL for tasks assessed/performed                Past Medical History:  Diagnosis Date   Diabetes mellitus without complication (HCC)    History of kidney stones    has had twice   Hyperlipidemia    Hypertension    Impaired fasting glucose    Kidney stone 1983   Melanoma of back [facility]) 2013   Dr Adolphus Birchwood   Pneumonia    walking back in 2002   Prostate cancer (HCC) 2019   Skin cancer 06/14/2011   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   Past Surgical History:  Procedure Laterality Date   COLONOSCOPY     KNEE SURGERY Right 1969   cartilage removal right knee    melanoma back  09/2010   PROSTATE BIOPSY     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   Procedure: LEFT TOTAL HIP ARTHROPLASTY ANTERIOR APPROACH;  Surgeon: Kathryne Hitch, MD;  Location: [facility] OR;  Service: Orthopedics;  Laterality: Left;   TOTAL KNEE ARTHROPLASTY Right 07/25/2023   Procedure: RIGHT TOTAL KNEE ARTHROPLASTY;  Surgeon: Kathryne Hitch, MD;  Location: [facility] OR;  Service: Orthopedics;  Laterality: Right;   Patient Active Problem List   Diagnosis Date Noted   OA (osteoarthritis) of knee 07/25/2023   Status post total right knee replacement 07/25/2023   Unilateral primary osteoarthritis, right knee 05/29/2023   Cognitive changes 01/24/2023   Left foot drop 12/31/2021   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   Right knee pain 10/25/2019   History of prostate  cancer 01/02/2018   Advance directive discussed with patient 09/26/2016   Status post left hip replacement 09/20/2016   Routine general medical examination at a health care facility 08/17/2011   History of melanoma    Hyperlipemia 05/20/2008   Benign essential hypertension 05/20/2008   Type 2 diabetes mellitus with other circulatory complications (HCC) 05/20/2008    PCP: Karie Schwalbe, MD   REFERRING PROVIDER: Kathryne Hitch*   REFERRING DIAG: J19.147 (ICD-10-CM) - Status post total right knee replacement   THERAPY DIAG:  Acute pain of right knee  Muscle weakness (generalized)  Difficulty in walking, not elsewhere classified  Localized edema  Stiffness of right knee, not elsewhere classified  Rationale for Evaluation and Treatment: Rehabilitation  ONSET DATE: status post right total knee arthroplasty 07/25/2023   SUBJECTIVE:   SUBJECTIVE STATEMENT: Pt arriving reporting compliane in his HEP. Pt reporting each day is a little better. Reporting riding a car is the worse pain.   PERTINENT HISTORY: Post op  PAIN:  NPRS scale: 0-1/10 at rest, 2-3/10 after therapy Pain location: Rt anteriror  Pain description: constant, achy, can be sharp Aggravating factors: walking, shower, sleeping on back Relieving factors: rest, meds  PRECAUTIONS: None  RED FLAGS: None   WEIGHT BEARING RESTRICTIONS: No  FALLS:  Has patient fallen in last 6 months? No  LIVING ENVIRONMENT: Has 15 stairs at home, reports he can do these already without much difficulty  OCCUPATION: retired but works part time at golf [employer]  PLOF: Independent  PATIENT GOALS: reduce pain, get back to normal and get back to golf  NEXT MD VISIT: 09/04/23  OBJECTIVE:  Note: Objective measures were completed at Evaluation unless otherwise noted.  PATIENT SURVEYS:  Eval Patient specific functional scale (0 unable to 10 no difficulty) 1)showering 2/10 2)walking without walker 1/10 Total  3/20, predicted goal >18/20   COGNITION: Overall cognitive status: Within functional limits for tasks assessed    EDEMA:  Moderate edema Right knee  LOWER EXTREMITY ROM:  AROM/ Passive ROM Right eval Right 08/16/23 Rt 08/22/23  Hip flexion     Hip extension     Hip abduction     Hip adduction     Hip internal rotation     Hip external rotation     Knee flexion 75/80 80/84 88/90   Knee extension 10/8  10/ 6  Ankle dorsiflexion     Ankle plantarflexion     Ankle inversion     Ankle eversion      (Blank rows = not tested)  LOWER EXTREMITY MMT:  MMT in sitting Right eval Left eval  Hip flexion 4   Hip extension    Hip abduction 4   Hip adduction    Hip internal rotation    Hip external rotation    Knee flexion 4   Knee extension 4   Ankle dorsiflexion    Ankle plantarflexion    Ankle inversion    Ankle eversion     (Blank rows = not tested)  FUNCTIONAL TESTS:  Eval: Sit to stand: unable to perform without UE support  GAIT: Distance walked: 100 Assistive device utilized: Walker - 2 wheeled Level of assistance: Modified independence Comments: good stability with this but poor without RW, and does not use AD PLOF                                                                                                                                TODAY'S TREATMENT:  08/22/23:  TherEx:  Scifit bike: level 4 starting with partial revolutions and progressing toward full revolutions.  SLR: x 10 Heel slides x 10  PROM knee flexion with active hip flexion x 10  TherActivities: Standing putting and picking up golf balls with body mechanic correction x 15  Sit to stand: 2 x 10 TRX: 2 x 10 Modalities:  Vasopneumatic: medium compression 34 deg x 10 minutes     08/16/2023 Therapeutic Exercises: Hamstring stretch with strap during rest on leg press - 30 sec hold 2 reps SciFit bike seat 12 rocking flexion stretch for 2 min then full revolutions level 1 6 min RLE Seated  heel slide on pillow case ext / quad set 5 sec, then active knee flexion with  end range assist with LLE 5 sec hold 10 reps  Therapeutic Activities: Pt amb with cane safely throughout session.  Leg press BLEs 81# 10 reps 3 sets with knee flexion stretch 5 sec PT demo & verbal cues on elevation higher than heart >/= 2x/day for >/= 15 min with ankle alphabet for muscle activity.  Pt verbalized understanding.  Weight shift onto RLE 3-5 sec hold upon arising prior to ambulating.  Pt return demo with less pain initiating gait.   Manual Therapy PROM knee flexion with overpressure grade IV mob Contract-relax for knee flexion seated.  Vaso 34* with elevation ext stretch medium compression 10 min    TREATMENT:  08/11/2023 Recumbent bike AAROM Seat 7 for 5 minutes AAROM Seated hamstrings stretch 2 x 20 seconds Seated straight leg raises 2 sets of 5 (quad set and pause 2 seconds before lifting) Seated LAQ 10 x 3 seconds Seated AAROM knee flexion 5 x 10 seconds Verbally reviewed supine knee flexion with strap  Functional Activities: Double Leg Press full extension and stretch into flexion 15 x slow eccentrics 50# Single Leg Press full extension and stretch into flexion 10 x slow eccentrics 25# Sit to stand 5 x with slow eccentric's  97535: Activities for sequencing with assistive device in parallel bars, discussed correct cane height at wrist watch level  Tandem balance 4 x 20 seconds   Vaso Medium right knee 10 minutes Medium 34*   Eval HEP creation and review with demonstration and trial set preformed, see below for details Nu step L5 X 10 min UE/LE for knee ROM Gastroc stretch on slantboard 30 sec X 3 Manual therapy: Rt knee PROM with overpressure to tolerance, manual hamstring stretching   PATIENT EDUCATION: Education details: HEP, PT plan of care Person educated: Patient Education method: Explanation, Demonstration, Verbal cues, and Handouts Education comprehension: verbalized  understanding and needs further education   HOME EXERCISE PROGRAM: Access Code: [email or web address] URL: [email or web address] Date: 08/11/2023 Prepared by: Pauletta Browns  Exercises - Supine Heel Slide with Strap  - 2 x daily - 6 x weekly - 1 sets - 10 reps - 5 hold - Seated Long Arc Quad  - 2 x daily - 6 x weekly - 2 sets - 10 reps - 3 sec hold - Seated Knee Flexion Stretch  - 2 x daily - 6 x weekly - 1 sets - 10 reps - 5 sec hold - Seated Straight Leg Raise with Quad Contraction  - 2 x daily - 6 x weekly - 2 sets - 10 reps - Seated Hamstring Stretch  - 2 x daily - 6 x weekly - 1 sets - 3 reps - 30 hold - Sit to Stand  - 2 x daily - 6 x weekly - 1-2 sets - 10 reps - Tandem Stance  - 1 x daily - 7 x weekly - 1 sets - 5 reps - 20 second hold   ASSESSMENT:  CLINICAL IMPRESSION: Pt arriving today reporting mild pain at rest and 3/10 pain at end of session prior to vasopneumtic device. Pt reporting he has been compliant in his HEP and working on improved flexion. Pt's active arc of motion today 10-88 degrees. Pt tolerating more functional mobility in hopes to get back to playing golf soon. Recommending continued skilled PT interventions.   Eval:  73 y.o. male presents to PT for eval and treat for for R TKA 07/25/2023.  He is doing well up to this point but does have increased knee stiffness which  will be early focus of PT. Patient will benefit from skilled PT to address below impairments, limitations and improve overall function.  OBJECTIVE IMPAIRMENTS: decreased activity tolerance, difficulty walking, decreased balance, decreased endurance, decreased mobility, decreased ROM, decreased strength, impaired flexibility, impaired LE use, and pain.  ACTIVITY LIMITATIONS: bending, lifting, carry, locomotion, cleaning, community activity, driving, and or occupation  PERSONAL FACTORS: see PMH, are also affecting patient's functional outcome.  REHAB POTENTIAL: Good  CLINICAL DECISION MAKING:  Stable/uncomplicated  EVALUATION COMPLEXITY: Low    GOALS: Short term PT Goals Target date: 09/05/2023   Pt will be I and compliant with HEP. Baseline:  Goal status: Ongoing  08/16/2023 Pt will decrease pain to less than 4/10 on average Baseline: Goal status:  Ongoing  08/16/2023  Long term PT goals Target date:10/17/2023   Pt will improve Rt knee AROM 3-110 deg  to improve functional mobility Baseline: Goal status: Ongoing  08/16/2023 Pt will improve  hip/knee strength to at least 5-/5 MMT to improve functional strength Baseline: Goal status: Ongoing  08/16/2023 Pt will improve PSFS to at least 18/20 to show improved function Baseline: Goal status: Ongoing  08/16/2023 Pt will reduce pain to overall less than 2-3/10 with usual activity, work activity and return to golf Baseline: Goal status:  Ongoing  08/16/2023 Pt will be able to ambulate community distances at least 1000 ft WNL gait pattern without complaints Baseline: Goal status: Ongoing  08/16/2023  PLAN: PT FREQUENCY: 1-3 times per week   PT DURATION: 6-10 weeks  PLANNED INTERVENTIONS (unless contraindicated): aquatic PT, Canalith repositioning, cryotherapy, Electrical stimulation, Iontophoresis with 4 mg/ml dexamethasome, Moist heat, traction, Ultrasound, gait training, Therapeutic exercise, balance training, neuromuscular re-education, patient/family education, manual techniques, passive ROM, dry needling, taping, vasopnuematic device, vestibular, spinal manipulations, joint manipulations 97110-Therapeutic exercises, 97530- Therapeutic activity, 97112- Neuromuscular re-education, 97535- Self Care, and 16109- Manual therapy  PLAN FOR NEXT SESSION: continue exercise and manual therapy for ROM, quadriceps strengthening and progressions.    Sharmon Leyden, PT, MPT 08/22/2023, 9:51 AM

## 2023-08-24 ENCOUNTER — Ambulatory Visit (INDEPENDENT_AMBULATORY_CARE_PROVIDER_SITE_OTHER): Payer: Medicare Other | Admitting: Physical Therapy

## 2023-08-24 ENCOUNTER — Encounter: Payer: Self-pay | Admitting: Physical Therapy

## 2023-08-24 DIAGNOSIS — M25561 Pain in right knee: Secondary | ICD-10-CM | POA: Diagnosis not present

## 2023-08-24 DIAGNOSIS — M6281 Muscle weakness (generalized): Secondary | ICD-10-CM | POA: Diagnosis not present

## 2023-08-24 DIAGNOSIS — R600 Localized edema: Secondary | ICD-10-CM | POA: Diagnosis not present

## 2023-08-24 DIAGNOSIS — R262 Difficulty in walking, not elsewhere classified: Secondary | ICD-10-CM | POA: Diagnosis not present

## 2023-08-24 DIAGNOSIS — M25661 Stiffness of right knee, not elsewhere classified: Secondary | ICD-10-CM | POA: Diagnosis not present

## 2023-08-24 NOTE — Therapy (Signed)
 OUTPATIENT PHYSICAL THERAPY LOWER EXTREMITY TREATMENT   Patient Name: Ricardo Reese MRN: [id number] DOB:11/09/1950, 73 y.o., male Today's Date: 08/24/2023  END OF SESSION:  PT End of Session - 08/24/23 1534     Visit Number 5    Number of Visits 20    Date for PT Re-Evaluation 10/17/23    Authorization Type MCR and BCBS    PT Start Time 1521    PT Stop Time 1600    PT Time Calculation (min) 39 min    Activity Tolerance Patient tolerated treatment well;No increased pain    Behavior During Therapy WFL for tasks assessed/performed                Past Medical History:  Diagnosis Date   Diabetes mellitus without complication (HCC)    History of kidney stones    has had twice   Hyperlipidemia    Hypertension    Impaired fasting glucose    Kidney stone 1983   Melanoma of back [facility]) 2013   Dr Adolphus Birchwood   Pneumonia    walking back in 2002   Prostate cancer (HCC) 2019   Skin cancer 06/14/2011   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   Past Surgical History:  Procedure Laterality Date   COLONOSCOPY     KNEE SURGERY Right 1969   cartilage removal right knee    melanoma back  09/2010   PROSTATE BIOPSY     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   Procedure: LEFT TOTAL HIP ARTHROPLASTY ANTERIOR APPROACH;  Surgeon: Kathryne Hitch, MD;  Location: [facility] OR;  Service: Orthopedics;  Laterality: Left;   TOTAL KNEE ARTHROPLASTY Right 07/25/2023   Procedure: RIGHT TOTAL KNEE ARTHROPLASTY;  Surgeon: Kathryne Hitch, MD;  Location: [facility] OR;  Service: Orthopedics;  Laterality: Right;   Patient Active Problem List   Diagnosis Date Noted   OA (osteoarthritis) of knee 07/25/2023   Status post total right knee replacement 07/25/2023   Unilateral primary osteoarthritis, right knee 05/29/2023   Cognitive changes 01/24/2023   Left foot drop 12/31/2021   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   Right knee pain 10/25/2019   History of prostate  cancer 01/02/2018   Advance directive discussed with patient 09/26/2016   Status post left hip replacement 09/20/2016   Routine general medical examination at a health care facility 08/17/2011   History of melanoma    Hyperlipemia 05/20/2008   Benign essential hypertension 05/20/2008   Type 2 diabetes mellitus with other circulatory complications (HCC) 05/20/2008    PCP: Karie Schwalbe, MD   REFERRING PROVIDER: Kirtland Bouchard, PA-C   REFERRING DIAG: [phone] (ICD-10-CM) - Status post total right knee replacement   THERAPY DIAG:  Acute pain of right knee  Difficulty in walking, not elsewhere classified  Muscle weakness (generalized)  Stiffness of right knee, not elsewhere classified  Localized edema  Rationale for Evaluation and Treatment: Rehabilitation  ONSET DATE: status post right total knee arthroplasty 07/25/2023   SUBJECTIVE:   SUBJECTIVE STATEMENT: Arriving stating the pain is not bad overall, riding in car and sit to stand can still be difficult   PERTINENT HISTORY: Post op  PAIN:  NPRS scale: 2-3/10 Pain location: Rt anteriror  Pain description: constant, achy, can be sharp Aggravating factors: walking, shower, sleeping on back Relieving factors: rest, meds  PRECAUTIONS: None  RED FLAGS: None   WEIGHT BEARING RESTRICTIONS: No  FALLS:  Has patient fallen in last 6 months?  No  LIVING ENVIRONMENT: Has 15 stairs at home, reports he can do these already without much difficulty  OCCUPATION: retired but works part time at golf [employer]  PLOF: Independent  PATIENT GOALS: reduce pain, get back to normal and get back to golf  NEXT MD VISIT: 09/04/23  OBJECTIVE:  Note: Objective measures were completed at Evaluation unless otherwise noted.  PATIENT SURVEYS:  Eval Patient specific functional scale (0 unable to 10 no difficulty) 1)showering 2/10 2)walking without walker 1/10 Total 3/20, predicted goal >18/20   COGNITION: Overall cognitive  status: Within functional limits for tasks assessed    EDEMA:  Moderate edema Right knee  LOWER EXTREMITY ROM:  AROM/ Passive ROM Right eval Right 08/16/23 Rt 08/22/23 Right 08/24/23  Hip flexion      Hip extension      Hip abduction      Hip adduction      Hip internal rotation      Hip external rotation      Knee flexion 75/80 80/84 88/90  /104  Knee extension 10/8  10/ 6   Ankle dorsiflexion      Ankle plantarflexion      Ankle inversion      Ankle eversion       (Blank rows = not tested)  LOWER EXTREMITY MMT:  MMT in sitting Right eval Left eval  Hip flexion 4   Hip extension    Hip abduction 4   Hip adduction    Hip internal rotation    Hip external rotation    Knee flexion 4   Knee extension 4   Ankle dorsiflexion    Ankle plantarflexion    Ankle inversion    Ankle eversion     (Blank rows = not tested)  FUNCTIONAL TESTS:  Eval: Sit to stand: unable to perform without UE support  GAIT: Distance walked: 100 Assistive device utilized: Environmental consultant - 2 wheeled Level of assistance: Modified independence Comments: good stability with this but poor without RW, and does not use AD PLOF                                                                                                                                TODAY'S TREATMENT:  08/24/2023 Therapeutic Exercises: Scit fit bike full revolutions X 10 min L4 Seated LAQ 4# 2X10 Seated knee flexion stretch 5 sec X 10   Therapeutic Activities: Pt amb without AD throughout session Leg press BLEs 81# 10 reps 2 sets with knee flexion stretch 5 sec, then Rt leg only 37# X15 Step ups forward, up with Rt leg and down in front with left leg X 10 with one UE support, cues to move foot toward edge of step to reduce knee flexion needed.   Manual Therapy PROM knee flexion with overpressure    08/22/23:  TherEx:  Scifit bike: level 4 starting with partial revolutions and progressing toward full revolutions.  SLR: x  10 Heel  slides x 10  PROM knee flexion with active hip flexion x 10  TherActivities: Standing putting and picking up golf balls with body mechanic correction x 15  Sit to stand: 2 x 10 TRX: 2 x 10 Modalities:  Vasopneumatic: medium compression 34 deg x 10 minutes     08/16/2023 Therapeutic Exercises: Hamstring stretch with strap during rest on leg press - 30 sec hold 2 reps SciFit bike seat 12 rocking flexion stretch for 2 min then full revolutions level 1 6 min RLE Seated heel slide on pillow case ext / quad set 5 sec, then active knee flexion with end range assist with LLE 5 sec hold 10 reps  Therapeutic Activities: Pt amb with cane safely throughout session.  Leg press BLEs 81# 10 reps 3 sets with knee flexion stretch 5 sec PT demo & verbal cues on elevation higher than heart >/= 2x/day for >/= 15 min with ankle alphabet for muscle activity.  Pt verbalized understanding.  Weight shift onto RLE 3-5 sec hold upon arising prior to ambulating.  Pt return demo with less pain initiating gait.   Manual Therapy PROM knee flexion with overpressure grade IV mob Contract-relax for knee flexion seated.  Vaso 34* with elevation ext stretch medium compression 10 min   PATIENT EDUCATION: Education details: HEP, PT plan of care Person educated: Patient Education method: Explanation, Demonstration, Verbal cues, and Handouts Education comprehension: verbalized understanding and needs further education   HOME EXERCISE PROGRAM: Access Code: [email or web address] URL: [email or web address] Date: 08/11/2023 Prepared by: Pauletta Browns  Exercises - Supine Heel Slide with Strap  - 2 x daily - 6 x weekly - 1 sets - 10 reps - 5 hold - Seated Long Arc Quad  - 2 x daily - 6 x weekly - 2 sets - 10 reps - 3 sec hold - Seated Knee Flexion Stretch  - 2 x daily - 6 x weekly - 1 sets - 10 reps - 5 sec hold - Seated Straight Leg Raise with Quad Contraction  - 2 x daily - 6 x weekly - 2 sets - 10  reps - Seated Hamstring Stretch  - 2 x daily - 6 x weekly - 1 sets - 3 reps - 30 hold - Sit to Stand  - 2 x daily - 6 x weekly - 1-2 sets - 10 reps - Tandem Stance  - 1 x daily - 7 x weekly - 1 sets - 5 reps - 20 second hold   ASSESSMENT:  CLINICAL IMPRESSION: He is making good overall progress and has improved knee flexion PROM to 104 deg today. He will continue to benefit from ROM and strength work in PT to improve function.   Eval:  73 y.o. male presents to PT for eval and treat for for R TKA 07/25/2023.  He is doing well up to this point but does have increased knee stiffness which will be early focus of PT. Patient will benefit from skilled PT to address below impairments, limitations and improve overall function.  OBJECTIVE IMPAIRMENTS: decreased activity tolerance, difficulty walking, decreased balance, decreased endurance, decreased mobility, decreased ROM, decreased strength, impaired flexibility, impaired LE use, and pain.  ACTIVITY LIMITATIONS: bending, lifting, carry, locomotion, cleaning, community activity, driving, and or occupation  PERSONAL FACTORS: see PMH, are also affecting patient's functional outcome.  REHAB POTENTIAL: Good  CLINICAL DECISION MAKING: Stable/uncomplicated  EVALUATION COMPLEXITY: Low    GOALS: Short term PT Goals Target date: 09/05/2023   Pt will be I and compliant with  HEP. Baseline:  Goal status: Ongoing  08/16/2023 Pt will decrease pain to less than 4/10 on average Baseline: Goal status:  Ongoing  08/16/2023  Long term PT goals Target date:10/17/2023   Pt will improve Rt knee AROM 3-110 deg  to improve functional mobility Baseline: Goal status: Ongoing  08/16/2023 Pt will improve  hip/knee strength to at least 5-/5 MMT to improve functional strength Baseline: Goal status: Ongoing  08/16/2023 Pt will improve PSFS to at least 18/20 to show improved function Baseline: Goal status: Ongoing  08/16/2023 Pt will reduce pain to overall less than  2-3/10 with usual activity, work activity and return to golf Baseline: Goal status:  Ongoing  08/16/2023 Pt will be able to ambulate community distances at least 1000 ft WNL gait pattern without complaints Baseline: Goal status: Ongoing  08/16/2023  PLAN: PT FREQUENCY: 1-3 times per week   PT DURATION: 6-10 weeks  PLANNED INTERVENTIONS (unless contraindicated): aquatic PT, Canalith repositioning, cryotherapy, Electrical stimulation, Iontophoresis with 4 mg/ml dexamethasome, Moist heat, traction, Ultrasound, gait training, Therapeutic exercise, balance training, neuromuscular re-education, patient/family education, manual techniques, passive ROM, dry needling, taping, vasopnuematic device, vestibular, spinal manipulations, joint manipulations 97110-Therapeutic exercises, 97530- Therapeutic activity, 97112- Neuromuscular re-education, 97535- Self Care, and 40981- Manual therapy  PLAN FOR NEXT SESSION: continue exercise and manual therapy for ROM, quadriceps strengthening and progressions.    April Manson, PT, DPT 08/24/2023, 3:35 PM

## 2023-08-28 ENCOUNTER — Ambulatory Visit (INDEPENDENT_AMBULATORY_CARE_PROVIDER_SITE_OTHER): Payer: Medicare Other | Admitting: Physical Therapy

## 2023-08-28 ENCOUNTER — Encounter: Payer: Self-pay | Admitting: Physical Therapy

## 2023-08-28 ENCOUNTER — Ambulatory Visit: Payer: Medicare Other | Admitting: Internal Medicine

## 2023-08-28 DIAGNOSIS — R600 Localized edema: Secondary | ICD-10-CM | POA: Diagnosis not present

## 2023-08-28 DIAGNOSIS — M6281 Muscle weakness (generalized): Secondary | ICD-10-CM

## 2023-08-28 DIAGNOSIS — R262 Difficulty in walking, not elsewhere classified: Secondary | ICD-10-CM

## 2023-08-28 DIAGNOSIS — M25661 Stiffness of right knee, not elsewhere classified: Secondary | ICD-10-CM

## 2023-08-28 DIAGNOSIS — M25561 Pain in right knee: Secondary | ICD-10-CM

## 2023-08-28 NOTE — Therapy (Signed)
 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   Past Surgical History:  Procedure Laterality Date   COLONOSCOPY     KNEE SURGERY Right 1969   cartilage removal right knee    melanoma back  09/2010   PROSTATE BIOPSY     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   Procedure: LEFT TOTAL HIP ARTHROPLASTY ANTERIOR APPROACH;  Surgeon: Kathryne Hitch, MD;  Location: [facility] OR;  Service: Orthopedics;  Laterality: Left;   TOTAL KNEE ARTHROPLASTY Right 07/25/2023   Procedure: RIGHT TOTAL KNEE ARTHROPLASTY;  Surgeon: Kathryne Hitch, MD;  Location: [facility] OR;  Service: Orthopedics;  Laterality: Right;   Patient Active Problem List   Diagnosis Date Noted   OA (osteoarthritis) of knee 07/25/2023   Status post total right knee replacement 07/25/2023   Unilateral primary osteoarthritis, right knee 05/29/2023   Cognitive changes 01/24/2023   Left foot drop 12/31/2021   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   Right knee pain 10/25/2019   History of prostate cancer 01/02/2018   Advance directive discussed with patient 09/26/2016   Status post left hip replacement 09/20/2016   Routine general medical examination at a health care facility 08/17/2011   History of melanoma    Hyperlipemia 05/20/2008   Benign essential hypertension 05/20/2008   Type 2 diabetes mellitus with other circulatory complications (HCC) 05/20/2008    PCP:  Karie Schwalbe, MD   REFERRING PROVIDER: Karie Schwalbe, MD   REFERRING DIAG: [phone] (ICD-10-CM) - Status post total right knee replacement   THERAPY DIAG:  No diagnosis found.  Rationale for Evaluation and Treatment: Rehabilitation  ONSET DATE: status post right total knee arthroplasty 07/25/2023   SUBJECTIVE:   SUBJECTIVE STATEMENT: He says pain is not all that bad, he has been compliant with HEP  PERTINENT HISTORY: Post op  PAIN:  NPRS scale: 2-3/10 Pain location: Rt anteriror  Pain description: constant, achy, can be sharp Aggravating factors: walking, shower, sleeping on back Relieving factors: rest, meds  PRECAUTIONS: None  RED FLAGS: None   WEIGHT BEARING RESTRICTIONS: No  FALLS:  Has patient fallen in last 6 months? No  LIVING ENVIRONMENT: Has 15 stairs at home, reports he can do these already without much difficulty  OCCUPATION: retired but works part time at golf [employer]  PLOF: Independent  PATIENT GOALS: reduce pain, get back to normal and get back to golf  NEXT MD VISIT: 09/04/23  OBJECTIVE:  Note: Objective measures were completed at Evaluation unless otherwise noted.  PATIENT SURVEYS:  Eval Patient specific functional scale (0 unable to 10 no difficulty) 1)showering 2/10 2)walking without walker 1/10 Total 3/20, predicted goal >18/20   COGNITION: Overall cognitive status: Within functional limits for tasks assessed    EDEMA:  Moderate edema Right knee  LOWER EXTREMITY ROM:  AROM/ Passive ROM Right eval Right  08/16/23 Rt 08/22/23 Right 08/24/23  Hip flexion      Hip extension      Hip abduction      Hip adduction      Hip internal rotation      Hip external rotation      Knee flexion 75/80 80/84 88/90  /104  Knee extension 10/8  10/ 6   Ankle dorsiflexion      Ankle plantarflexion      Ankle inversion      Ankle eversion       (Blank rows = not tested)  LOWER EXTREMITY MMT:  MMT in sitting Right eval Left eval   Hip flexion 4   Hip extension    Hip abduction 4   Hip adduction    Hip internal rotation    Hip external rotation    Knee flexion 4   Knee extension 4   Ankle dorsiflexion    Ankle plantarflexion    Ankle inversion    Ankle eversion     (Blank rows = not tested)  FUNCTIONAL TESTS:  Eval: Sit to stand: unable to perform without UE support  GAIT: Distance walked: 100 Assistive device utilized: Environmental consultant - 2 wheeled Level of assistance: Modified independence Comments: good stability with this but poor without RW, and does not use AD PLOF                                                                                                                                TODAY'S TREATMENT:  08/28/2023 Therapeutic Exercises: Nu step L6 X 10 min Seated knee flexion stretch 5 sec X 10   Therapeutic Activities: Pt amb without AD throughout session Leg press BLEs 87# 10 reps 2 sets with knee flexion stretch 5 sec, then Rt leg only 43# X15 Step ups forward, up with Rt leg and down in front with left leg X 10 with one UE support, cues to move foot toward edge of step to reduce knee flexion needed.  Step ups lateral 6 inch step X 10 bilat with UE support Seated knee extension machine 5# Rt leg only 2X10  Manual Therapy PROM knee flexion with overpressure   08/24/2023 Therapeutic Exercises: Scit fit bike full revolutions X 10 min L4 Seated LAQ 4# 2X10 Seated knee flexion stretch 5 sec X 10   Therapeutic Activities: Pt amb without AD throughout session Leg press BLEs 81# 10 reps 2 sets with knee flexion stretch 5 sec, then Rt leg only 37# X15 Step ups forward, up with Rt leg and down in front with left leg X 10 with one UE support, cues to move foot toward edge of step to reduce knee flexion needed.   Manual Therapy PROM knee flexion with overpressure    08/22/23:  TherEx:  Scifit bike: level 4 starting with partial revolutions and progressing toward full revolutions.  SLR: x  10 Heel slides x 10  PROM knee flexion with active  hip flexion x 10  TherActivities: Standing putting and picking up golf balls with body mechanic correction x 15  Sit to stand: 2 x 10 TRX: 2 x 10 Modalities:  Vasopneumatic: medium compression 34 deg x 10 minutes     08/16/2023 Therapeutic Exercises: Hamstring stretch with strap during rest on leg press - 30 sec hold 2 reps SciFit bike seat 12 rocking flexion stretch for 2 min then full revolutions level 1 6 min RLE Seated heel slide on pillow case ext / quad set 5 sec, then active knee flexion with end range assist with LLE 5 sec hold 10 reps  Therapeutic Activities: Pt amb with cane safely throughout session.  Leg press BLEs 81# 10 reps 3 sets with knee flexion stretch 5 sec PT demo & verbal cues on elevation higher than heart >/= 2x/day for >/= 15 min with ankle alphabet for muscle activity.  Pt verbalized understanding.  Weight shift onto RLE 3-5 sec hold upon arising prior to ambulating.  Pt return demo with less pain initiating gait.   Manual Therapy PROM knee flexion with overpressure grade IV mob Contract-relax for knee flexion seated.  Vaso 34* with elevation ext stretch medium compression 10 min   PATIENT EDUCATION: Education details: HEP, PT plan of care Person educated: Patient Education method: Explanation, Demonstration, Verbal cues, and Handouts Education comprehension: verbalized understanding and needs further education   HOME EXERCISE PROGRAM: Access Code: [email or web address] URL: [email or web address] Date: 08/11/2023 Prepared by: Pauletta Browns  Exercises - Supine Heel Slide with Strap  - 2 x daily - 6 x weekly - 1 sets - 10 reps - 5 hold - Seated Long Arc Quad  - 2 x daily - 6 x weekly - 2 sets - 10 reps - 3 sec hold - Seated Knee Flexion Stretch  - 2 x daily - 6 x weekly - 1 sets - 10 reps - 5 sec hold - Seated Straight Leg Raise with Quad Contraction  - 2 x daily - 6 x weekly - 2 sets - 10  reps - Seated Hamstring Stretch  - 2 x daily - 6 x weekly - 1 sets - 3 reps - 30 hold - Sit to Stand  - 2 x daily - 6 x weekly - 1-2 sets - 10 reps - Tandem Stance  - 1 x daily - 7 x weekly - 1 sets - 5 reps - 20 second hold   ASSESSMENT:  CLINICAL IMPRESSION: He continues to progress well but will need additional strength work to get back to full function.   Eval:  73 y.o. male presents to PT for eval and treat for for R TKA 07/25/2023.  He is doing well up to this point but does have increased knee stiffness which will be early focus of PT. Patient will benefit from skilled PT to address below impairments, limitations and improve overall function.  OBJECTIVE IMPAIRMENTS: decreased activity tolerance, difficulty walking, decreased balance, decreased endurance, decreased mobility, decreased ROM, decreased strength, impaired flexibility, impaired LE use, and pain.  ACTIVITY LIMITATIONS: bending, lifting, carry, locomotion, cleaning, community activity, driving, and or occupation  PERSONAL FACTORS: see PMH, are also affecting patient's functional outcome.  REHAB POTENTIAL: Good  CLINICAL DECISION MAKING: Stable/uncomplicated  EVALUATION COMPLEXITY: Low    GOALS: Short term PT Goals Target date: 09/05/2023   Pt will be I and compliant with HEP. Baseline:  Goal status: Ongoing  08/16/2023 Pt will decrease pain to less than 4/10 on average Baseline: Goal status:  Ongoing  08/16/2023  Long term PT goals Target date:10/17/2023   Pt will improve Rt knee AROM 3-110 deg  to improve functional mobility Baseline: Goal status: Ongoing  08/16/2023 Pt will improve  hip/knee strength to at least 5-/5 MMT to improve functional strength Baseline: Goal status: Ongoing  08/16/2023 Pt will improve PSFS to at least 18/20 to show improved function Baseline: Goal status: Ongoing  08/16/2023 Pt will reduce pain to overall less than 2-3/10 with usual activity, work activity and return to  golf Baseline: Goal status:  Ongoing  08/16/2023 Pt will be able to ambulate community distances at least 1000 ft WNL gait pattern without complaints Baseline: Goal status: Ongoing  08/16/2023  PLAN: PT FREQUENCY: 1-3 times per week   PT DURATION: 6-10 weeks  PLANNED INTERVENTIONS (unless contraindicated): aquatic PT, Canalith repositioning, cryotherapy, Electrical stimulation, Iontophoresis with 4 mg/ml dexamethasome, Moist heat, traction, Ultrasound, gait training, Therapeutic exercise, balance training, neuromuscular re-education, patient/family education, manual techniques, passive ROM, dry needling, taping, vasopnuematic device, vestibular, spinal manipulations, joint manipulations 97110-Therapeutic exercises, 97530- Therapeutic activity, 97112- Neuromuscular re-education, 97535- Self Care, and 16109- Manual therapy  PLAN FOR NEXT SESSION: continue exercise and manual therapy for ROM, quadriceps strengthening and progressions.    April Manson, PT, DPT 08/28/2023, 2:50 PM

## 2023-08-31 ENCOUNTER — Ambulatory Visit: Payer: Medicare Other | Admitting: Rehabilitative and Restorative Service Providers"

## 2023-08-31 ENCOUNTER — Encounter: Payer: Self-pay | Admitting: Rehabilitative and Restorative Service Providers"

## 2023-08-31 DIAGNOSIS — R600 Localized edema: Secondary | ICD-10-CM | POA: Diagnosis not present

## 2023-08-31 DIAGNOSIS — R262 Difficulty in walking, not elsewhere classified: Secondary | ICD-10-CM

## 2023-08-31 DIAGNOSIS — M25661 Stiffness of right knee, not elsewhere classified: Secondary | ICD-10-CM

## 2023-08-31 DIAGNOSIS — M25561 Pain in right knee: Secondary | ICD-10-CM

## 2023-08-31 DIAGNOSIS — M6281 Muscle weakness (generalized): Secondary | ICD-10-CM

## 2023-08-31 NOTE — Therapy (Signed)
 OUTPATIENT PHYSICAL THERAPY LOWER EXTREMITY TREATMENT   Patient Name: Ricardo Reese MRN: [id number] DOB:1951/02/06, 73 y.o., male Today's Date: 08/31/2023  END OF SESSION:  PT End of Session - 08/31/23 1344     Visit Number 7    Number of Visits 20    Date for PT Re-Evaluation 10/17/23    Authorization Type MCR and BCBS    PT Start Time 1344    PT Stop Time 1429    PT Time Calculation (min) 45 min    Activity Tolerance Patient tolerated treatment well;No increased pain    Behavior During Therapy WFL for tasks assessed/performed              Past Medical History:  Diagnosis Date   Diabetes mellitus without complication (HCC)    History of kidney stones    has had twice   Hyperlipidemia    Hypertension    Impaired fasting glucose    Kidney stone 1983   Melanoma of back [facility]) 2013   Dr Adolphus Birchwood   Pneumonia    walking back in 2002   Prostate cancer (HCC) 2019   Skin cancer 06/14/2011   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   Past Surgical History:  Procedure Laterality Date   COLONOSCOPY     KNEE SURGERY Right 1969   cartilage removal right knee    melanoma back  09/2010   PROSTATE BIOPSY     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   Procedure: LEFT TOTAL HIP ARTHROPLASTY ANTERIOR APPROACH;  Surgeon: Kathryne Hitch, MD;  Location: [facility] OR;  Service: Orthopedics;  Laterality: Left;   TOTAL KNEE ARTHROPLASTY Right 07/25/2023   Procedure: RIGHT TOTAL KNEE ARTHROPLASTY;  Surgeon: Kathryne Hitch, MD;  Location: [facility] OR;  Service: Orthopedics;  Laterality: Right;   Patient Active Problem List   Diagnosis Date Noted   OA (osteoarthritis) of knee 07/25/2023   Status post total right knee replacement 07/25/2023   Unilateral primary osteoarthritis, right knee 05/29/2023   Cognitive changes 01/24/2023   Left foot drop 12/31/2021   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   Right knee pain 10/25/2019   History of prostate cancer  01/02/2018   Advance directive discussed with patient 09/26/2016   Status post left hip replacement 09/20/2016   Routine general medical examination at a health care facility 08/17/2011   History of melanoma    Hyperlipemia 05/20/2008   Benign essential hypertension 05/20/2008   Type 2 diabetes mellitus with other circulatory complications (HCC) 05/20/2008    PCP: Karie Schwalbe, MD   REFERRING PROVIDER: Kirtland Bouchard, PA-C   REFERRING DIAG: [phone] (ICD-10-CM) - Status post total right knee replacement   THERAPY DIAG:  Acute pain of right knee  Localized edema  Difficulty in walking, not elsewhere classified  Muscle weakness (generalized)  Stiffness of right knee, not elsewhere classified  Rationale for Evaluation and Treatment: Rehabilitation  ONSET DATE: status post right total knee arthroplasty 07/25/2023   SUBJECTIVE:   SUBJECTIVE STATEMENT: Ricardo Reese notes significant progress since starting the supervised physical therapy.  He does have concerns with his knee flexion active range of motion and quadriceps strength when descending stairs.  PERTINENT HISTORY: Post op  PAIN:  NPRS scale: 2-4/10 this week Pain location: Rt anteriror  Pain description: constant, achy, can be sharp Aggravating factors: walking, shower, sleeping on back Relieving factors: rest, meds  PRECAUTIONS: None  RED FLAGS: None   WEIGHT BEARING RESTRICTIONS: No  FALLS:  Has patient fallen in last 6 months? No  LIVING ENVIRONMENT: Has 15 stairs at home, reports he can do these already without much difficulty  OCCUPATION: retired but works part time at [employer]  PLOF: Independent  PATIENT GOALS: reduce pain, get back to normal and get back to golf  NEXT MD VISIT: 09/04/23  OBJECTIVE:  Note: Objective measures were completed at Evaluation unless otherwise noted.  PATIENT SURVEYS:  Eval Patient specific functional scale (0 unable to 10 no difficulty) 1)showering  2/10 2)walking without walker 1/10 Total 3/20, predicted goal >18/20   COGNITION: Overall cognitive status: Within functional limits for tasks assessed    EDEMA:  Moderate edema Right knee  LOWER EXTREMITY ROM:  AROM/ Passive ROM Right eval Right 08/16/23 Rt 08/22/23 Right 08/24/23 Right 09/10/2023  Hip flexion       Hip extension       Hip abduction       Hip adduction       Hip internal rotation       Hip external rotation       Knee flexion 75/80 80/84 88/90  /104 AAROM 97  Knee extension 10/8  10/ 6  Lacking 6 degrees  Ankle dorsiflexion       Ankle plantarflexion       Ankle inversion       Ankle eversion        (Blank rows = not tested)  LOWER EXTREMITY MMT:  MMT in sitting Right eval Left eval  Hip flexion 4   Hip extension    Hip abduction 4   Hip adduction    Hip internal rotation    Hip external rotation    Knee flexion 4   Knee extension 4   Ankle dorsiflexion    Ankle plantarflexion    Ankle inversion    Ankle eversion     (Blank rows = not tested)  FUNCTIONAL TESTS:  Eval: Sit to stand: unable to perform without UE support  GAIT: Distance walked: 100 Assistive device utilized: Environmental consultant - 2 wheeled Level of assistance: Modified independence Comments: good stability with this but poor without RW, and does not use AD PLOF                                                                                                                                TODAY'S TREATMENT:  08/31/2023 Recumbent bike Seat 9 for 5 minutes AAROM working on flexion T[place]gate knee flexion 1 minute Seated knee flexion (left pushes right into flexion) 10 x 10 seconds with physical therapist assistance Quadriceps sets with heel prop 10 x 5 seconds Supine knee flexion with belt 10 x 10 seconds  Functional Activities: Step-down off 4 and 2 inch step with UE assist 2 sets of 10 with slow eccentrics Single leg Press 50# 15 x slow eccentrics full extension to full  flexion   08/28/2023 Therapeutic Exercises: Nu step L6 X 10 min  Seated knee flexion stretch 5 sec X 10   Therapeutic Activities: Pt amb without AD throughout session Leg press BLEs 87# 10 reps 2 sets with knee flexion stretch 5 sec, then Rt leg only 43# X15 Step ups forward, up with Rt leg and down in front with left leg X 10 with one UE support, cues to move foot toward edge of step to reduce knee flexion needed.  Step ups lateral 6 inch step X 10 bilat with UE support Seated knee extension machine 5# Rt leg only 2X10  Manual Therapy PROM knee flexion with overpressure    08/24/2023 Therapeutic Exercises: Scit fit bike full revolutions X 10 min L4 Seated LAQ 4# 2X10 Seated knee flexion stretch 5 sec X 10   Therapeutic Activities: Pt amb without AD throughout session Leg press BLEs 81# 10 reps 2 sets with knee flexion stretch 5 sec, then Rt leg only 37# X15 Step ups forward, up with Rt leg and down in front with left leg X 10 with one UE support, cues to move foot toward edge of step to reduce knee flexion needed.   Manual Therapy PROM knee flexion with overpressure  PATIENT EDUCATION: Education details: HEP, PT plan of care Person educated: Patient Education method: Explanation, Demonstration, Verbal cues, and Handouts Education comprehension: verbalized understanding and needs further education   HOME EXERCISE PROGRAM: Access Code: [email or web address] URL: [email or web address] Date: 08/31/2023 Prepared by: Pauletta Browns  Exercises - Supine Heel Slide with Strap  - 2 x daily - 6 x weekly - 1 sets - 10 reps - 5 hold - Seated Long Arc Quad  - 2 x daily - 6 x weekly - 2 sets - 10 reps - 3 sec hold - Seated Knee Flexion Stretch  - 2 x daily - 6 x weekly - 1 sets - 10 reps - 5 sec hold - Seated Straight Leg Raise with Quad Contraction  - 2 x daily - 6 x weekly - 2 sets - 10 reps - Seated Hamstring Stretch  - 2 x daily - 6 x weekly - 1 sets - 3 reps - 30 hold - Sit  to Stand  - 2 x daily - 6 x weekly - 1-2 sets - 10 reps - Tandem Stance  - 1 x daily - 7 x weekly - 1 sets - 5 reps - 20 second hold - Supine Quadricep Sets  - 5 x daily - 7 x weekly - 2 sets - 10 reps - 5 second hold  ASSESSMENT:  CLINICAL IMPRESSION: Ricardo Reese is very pleased with his current progress with his supervised physical therapy.  We did spend today working on his primary concerns of knee flexion active range of motion and with difficulty descending stairs.  I encouraged Ricardo Reese to get 100 quadriceps sets in per day and 30-50 repetitions of some type of knee flexion activity.  This should address his remaining active range of motion, quadriceps strength, edema and functional impairments.  Ricardo Reese remains on track to meet the below listed goals.  Eval:  73 y.o. male presents to PT for eval and treat for for R TKA 07/25/2023.  He is doing well up to this point but does have increased knee stiffness which will be early focus of PT. Patient will benefit from skilled PT to address below impairments, limitations and improve overall function.  OBJECTIVE IMPAIRMENTS: decreased activity tolerance, difficulty walking, decreased balance, decreased endurance, decreased mobility, decreased ROM, decreased strength, impaired flexibility, impaired LE use, and pain.  ACTIVITY  LIMITATIONS: bending, lifting, carry, locomotion, cleaning, community activity, driving, and or occupation  PERSONAL FACTORS: see PMH, are also affecting patient's functional outcome.  REHAB POTENTIAL: Good  CLINICAL DECISION MAKING: Stable/uncomplicated  EVALUATION COMPLEXITY: Low    GOALS: Short term PT Goals Target date: 09/05/2023   Pt will be I and compliant with HEP. Baseline:  Goal status: Met 08/31/2023 Pt will decrease pain to less than 4/10 on average Baseline: Goal status:  Ongoing  08/31/2023  Long term PT goals Target date:10/17/2023   Pt will improve Rt knee AROM 3-110 deg  to improve functional  mobility Baseline: Goal status: Ongoing  08/31/2023 Pt will improve  hip/knee strength to at least 5-/5 MMT to improve functional strength Baseline: Goal status: Ongoing  08/16/2023 Pt will improve PSFS to at least 18/20 to show improved function Baseline: Goal status: Ongoing  08/16/2023 Pt will reduce pain to overall less than 2-3/10 with usual activity, work activity and return to golf Baseline: Goal status:  Ongoing  08/31/2023 Pt will be able to ambulate community distances at least 1000 ft WNL gait pattern without complaints Baseline: Goal status: Ongoing  08/31/2023  PLAN: PT FREQUENCY: 1-3 times per week   PT DURATION: 6-10 weeks  PLANNED INTERVENTIONS (unless contraindicated): aquatic PT, Canalith repositioning, cryotherapy, Electrical stimulation, Iontophoresis with 4 mg/ml dexamethasome, Moist heat, traction, Ultrasound, gait training, Therapeutic exercise, balance training, neuromuscular re-education, patient/family education, manual techniques, passive ROM, dry needling, taping, vasopnuematic device, vestibular, spinal manipulations, joint manipulations 97110-Therapeutic exercises, 97530- Therapeutic activity, 97112- Neuromuscular re-education, 97535- Self Care, and 53664- Manual therapy  PLAN FOR NEXT SESSION: Continue focus on knee active range of motion, flexion and extension.  Quadriceps strengthening to improve his ability to descend stairs.   Cherlyn Cushing, PT, MPT 08/31/2023, 3:30 PM

## 2023-09-04 ENCOUNTER — Encounter: Payer: Self-pay | Admitting: Orthopaedic Surgery

## 2023-09-04 ENCOUNTER — Ambulatory Visit (INDEPENDENT_AMBULATORY_CARE_PROVIDER_SITE_OTHER): Payer: Medicare Other | Admitting: Orthopaedic Surgery

## 2023-09-04 DIAGNOSIS — T8482XD Fibrosis due to internal orthopedic prosthetic devices, implants and grafts, subsequent encounter: Secondary | ICD-10-CM

## 2023-09-04 DIAGNOSIS — Z96651 Presence of right artificial knee joint: Secondary | ICD-10-CM

## 2023-09-04 NOTE — Progress Notes (Signed)
 The patient is here for follow-up right around 6 weeks status post a right total knee arthroplasty.  We have replaced the hip on him before.  He has been diligent with pushing himself through therapy and is not taking pain medicine.  On my exam today he lacks full extension by maybe 3 degrees but I could only flex him to 90 degrees and can really could not get him beyond that.  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  I described what this involves and discussed the risk and benefits of this outpatient procedure.  I want him in therapy even the Afrin that we perform the manipulation in the morning.  All question concerns were answered and addressed.  At his next visit I would like a standing AP and lateral of his right operative knee.  We will be in touch with scheduling manipulation under anesthesia of his right knee.

## 2023-09-05 ENCOUNTER — Ambulatory Visit (INDEPENDENT_AMBULATORY_CARE_PROVIDER_SITE_OTHER): Payer: Medicare Other | Admitting: Physical Therapy

## 2023-09-05 ENCOUNTER — Encounter: Payer: Self-pay | Admitting: Physical Therapy

## 2023-09-05 DIAGNOSIS — M6281 Muscle weakness (generalized): Secondary | ICD-10-CM | POA: Diagnosis not present

## 2023-09-05 DIAGNOSIS — M25661 Stiffness of right knee, not elsewhere classified: Secondary | ICD-10-CM

## 2023-09-05 DIAGNOSIS — M25561 Pain in right knee: Secondary | ICD-10-CM

## 2023-09-05 DIAGNOSIS — R262 Difficulty in walking, not elsewhere classified: Secondary | ICD-10-CM

## 2023-09-05 DIAGNOSIS — R600 Localized edema: Secondary | ICD-10-CM | POA: Diagnosis not present

## 2023-09-05 NOTE — Therapy (Signed)
 OUTPATIENT PHYSICAL THERAPY LOWER EXTREMITY TREATMENT   Patient Name: Ricardo Reese MRN: [id number] DOB:19-Dec-1950, 73 y.o., male Today's Date: 09/05/2023  END OF SESSION:  PT End of Session - 09/05/23 1356     Visit Number 8    Number of Visits 20    Date for PT Re-Evaluation 10/17/23    Authorization Type MCR and BCBS    PT Start Time 1345    PT Stop Time 1430    PT Time Calculation (min) 45 min    Activity Tolerance Patient tolerated treatment well;No increased pain    Behavior During Therapy WFL for tasks assessed/performed              Past Medical History:  Diagnosis Date   Diabetes mellitus without complication (HCC)    History of kidney stones    has had twice   Hyperlipidemia    Hypertension    Impaired fasting glucose    Kidney stone 1983   Melanoma of back [facility]) 2013   Dr Adolphus Birchwood   Pneumonia    walking back in 2002   Prostate cancer (HCC) 2019   Skin cancer 06/14/2011   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   Past Surgical History:  Procedure Laterality Date   COLONOSCOPY     KNEE SURGERY Right 1969   cartilage removal right knee    melanoma back  09/2010   PROSTATE BIOPSY     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   Procedure: LEFT TOTAL HIP ARTHROPLASTY ANTERIOR APPROACH;  Surgeon: Kathryne Hitch, MD;  Location: [facility] OR;  Service: Orthopedics;  Laterality: Left;   TOTAL KNEE ARTHROPLASTY Right 07/25/2023   Procedure: RIGHT TOTAL KNEE ARTHROPLASTY;  Surgeon: Kathryne Hitch, MD;  Location: [facility] OR;  Service: Orthopedics;  Laterality: Right;   Patient Active Problem List   Diagnosis Date Noted   OA (osteoarthritis) of knee 07/25/2023   Status post total right knee replacement 07/25/2023   Unilateral primary osteoarthritis, right knee 05/29/2023   Cognitive changes 01/24/2023   Left foot drop 12/31/2021   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   Right knee pain 10/25/2019   History of prostate cancer  01/02/2018   Advance directive discussed with patient 09/26/2016   Status post left hip replacement 09/20/2016   Routine general medical examination at a health care facility 08/17/2011   History of melanoma    Hyperlipemia 05/20/2008   Benign essential hypertension 05/20/2008   Type 2 diabetes mellitus with other circulatory complications (HCC) 05/20/2008    PCP: Karie Schwalbe, MD   REFERRING PROVIDER: Kirtland Bouchard, PA-C   REFERRING DIAG: [phone] (ICD-10-CM) - Status post total right knee replacement   THERAPY DIAG:  Acute pain of right knee  Localized edema  Difficulty in walking, not elsewhere classified  Muscle weakness (generalized)  Stiffness of right knee, not elsewhere classified  Rationale for Evaluation and Treatment: Rehabilitation  ONSET DATE: status post right total knee arthroplasty 07/25/2023   SUBJECTIVE:   SUBJECTIVE STATEMENT: He relays Dr. Magnus Ivan felt he had too much scar tissue build up and is recommending MUA.  PERTINENT HISTORY: Post op  PAIN:  NPRS scale: 2-4/10 this week Pain location: Rt anteriror  Pain description: constant, achy, can be sharp Aggravating factors: walking, shower, sleeping on back Relieving factors: rest, meds  PRECAUTIONS: None  RED FLAGS: None   WEIGHT BEARING RESTRICTIONS: No  FALLS:  Has patient fallen in last 6 months? 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  PATIENT SURVEYS:  Eval Patient specific functional scale (0 unable to 10 no difficulty) 1)showering 2/10 2)walking without walker 1/10 Total 3/20, predicted goal >18/20   COGNITION: Overall cognitive status:  Within functional limits for tasks assessed    EDEMA:  Moderate edema Right knee  LOWER EXTREMITY ROM:  AROM/ Passive ROM Right eval Right 08/16/23 Rt 08/22/23 Right 08/24/23 Right 09/10/2023  Hip flexion       Hip extension       Hip abduction       Hip adduction       Hip internal rotation       Hip external rotation       Knee flexion 75/80 80/84 88/90  /104 AAROM 97  Knee extension 10/8  10/ 6  Lacking 6 degrees  Ankle dorsiflexion       Ankle plantarflexion       Ankle inversion       Ankle eversion        (Blank rows = not tested)  LOWER EXTREMITY MMT:  MMT in sitting Right eval Left eval  Hip flexion 4   Hip extension    Hip abduction 4   Hip adduction    Hip internal rotation    Hip external rotation    Knee flexion 4   Knee extension 4   Ankle dorsiflexion    Ankle plantarflexion    Ankle inversion    Ankle eversion     (Blank rows = not tested)  FUNCTIONAL TESTS:  Eval: Sit to stand: unable to perform without UE support  GAIT: Distance walked: 100 Assistive device utilized: Environmental consultant - 2 wheeled Level of assistance: Modified independence Comments: good stability with this but poor without RW, and does not use AD PLOF                                                                                                                                TODAY'S TREATMENT:  09/05/2023 Therapeutic Exercises: Nu step L6 X 10 min Seated knee flexion stretch 5 sec X 10   Therapeutic Activities: Pt amb without AD throughout session Leg press BLEs 87# 10 reps 2 sets with knee flexion stretch 5 sec, then Rt leg only 50# X15 Step ups forward, 4 inch step up with Rt leg and down in front with left leg X 10 with one UE support, cues to move foot toward edge of step to reduce knee flexion needed.  Step ups lateral 4 inch inch step X 10 bilat with UE support Seated knee extension machine 5# Rt leg only 2X10, then 10#  X 15 double leg Seated hamstring curl machine DL 11#  [id number]    02/09/5620 Recumbent bike Seat 9 for 5 minutes AAROM working on flexion T[place]gate knee flexion 1 minute Seated knee flexion (left pushes right into flexion) 10 x 10 seconds with physical therapist assistance Quadriceps sets with heel prop 10 x 5 seconds Supine knee flexion with belt 10 x 10 seconds  Functional Activities: Step-down off 4 and 2 inch step with UE assist 2 sets of 10 with slow eccentrics Single leg Press 50# 15 x slow eccentrics full extension to full flexion   08/28/2023 Therapeutic Exercises: Nu step L6 X 10 min Seated knee flexion stretch 5 sec X 10   Therapeutic Activities: Pt amb without AD throughout session Leg press BLEs 87# 10 reps 2 sets with knee flexion stretch 5 sec, then Rt leg only 43# X15 Step ups forward, up with Rt leg and down in front with left leg X 10 with one UE support, cues to move foot toward edge of step to reduce knee flexion needed.  Step ups lateral 6 inch step X 10 bilat with UE support Seated knee extension machine 5# Rt leg only 2X10  Manual Therapy PROM knee flexion with overpressure    08/24/2023 Therapeutic Exercises: Scit fit bike full revolutions X 10 min L4 Seated LAQ 4# 2X10 Seated knee flexion stretch 5 sec X 10   Therapeutic Activities: Pt amb without AD throughout session Leg press BLEs 81# 10 reps 2 sets with knee flexion stretch 5 sec, then Rt leg only 37# X15 Step ups forward, up with Rt leg and down in front with left leg X 10 with one UE support, cues to move foot toward edge of step to reduce knee flexion needed.   Manual Therapy PROM knee flexion with overpressure  PATIENT EDUCATION: Education details: HEP, PT plan of care Person educated: Patient Education method: Explanation, Demonstration, Verbal cues, and Handouts Education comprehension: verbalized understanding and needs further education   HOME EXERCISE PROGRAM: Access Code: [email or web address] URL: [email or web address] Date:  08/31/2023 Prepared by: Pauletta Browns  Exercises - Supine Heel Slide with Strap  - 2 x daily - 6 x weekly - 1 sets - 10 reps - 5 hold - Seated Long Arc Quad  - 2 x daily - 6 x weekly - 2 sets - 10 reps - 3 sec hold - Seated Knee Flexion Stretch  - 2 x daily - 6 x weekly - 1 sets - 10 reps - 5 sec hold - Seated Straight Leg Raise with Quad Contraction  - 2 x daily - 6 x weekly - 2 sets - 10 reps - Seated Hamstring Stretch  - 2 x daily - 6 x weekly - 1 sets - 3 reps - 30 hold - Sit to Stand  - 2 x daily - 6 x weekly - 1-2 sets - 10 reps - Tandem Stance  - 1 x daily - 7 x weekly - 1 sets - 5 reps - 20 second hold - Supine Quadricep Sets  - 5 x daily - 7 x weekly - 2 sets - 10 reps - 5 second hold  ASSESSMENT:  CLINICAL IMPRESSION: It appears MD is recommending to have MUA but this has not yet been scheduled. I advised patient that once he knows when this is scheduled to call PT office and let us know so we can schedule him PT ASAP for consecutive days to keep ROM gained during the MUA.  OBJECTIVE IMPAIRMENTS: decreased activity  tolerance, difficulty walking, decreased balance, decreased endurance, decreased mobility, decreased ROM, decreased strength, impaired flexibility, impaired LE use, and pain.  ACTIVITY LIMITATIONS: bending, lifting, carry, locomotion, cleaning, community activity, driving, and or occupation  PERSONAL FACTORS: see PMH, are also affecting patient's functional outcome.  REHAB POTENTIAL: Good  CLINICAL DECISION MAKING: Stable/uncomplicated  EVALUATION COMPLEXITY: Low    GOALS: Short term PT Goals Target date: 09/05/2023   Pt will be I and compliant with HEP. Baseline:  Goal status: Met 08/31/2023 Pt will decrease pain to less than 4/10 on average Baseline: Goal status:  Ongoing  08/31/2023  Long term PT goals Target date:10/17/2023   Pt will improve Rt knee AROM 3-110 deg  to improve functional mobility Baseline: Goal status: Ongoing  08/31/2023 Pt will  improve  hip/knee strength to at least 5-/5 MMT to improve functional strength Baseline: Goal status: Ongoing  08/16/2023 Pt will improve PSFS to at least 18/20 to show improved function Baseline: Goal status: Ongoing  08/16/2023 Pt will reduce pain to overall less than 2-3/10 with usual activity, work activity and return to golf Baseline: Goal status:  Ongoing  08/31/2023 Pt will be able to ambulate community distances at least 1000 ft WNL gait pattern without complaints Baseline: Goal status: Ongoing  08/31/2023  PLAN: PT FREQUENCY: 1-3 times per week   PT DURATION: 6-10 weeks  PLANNED INTERVENTIONS (unless contraindicated): aquatic PT, Canalith repositioning, cryotherapy, Electrical stimulation, Iontophoresis with 4 mg/ml dexamethasome, Moist heat, traction, Ultrasound, gait training, Therapeutic exercise, balance training, neuromuscular re-education, patient/family education, manual techniques, passive ROM, dry needling, taping, vasopnuematic device, vestibular, spinal manipulations, joint manipulations 97110-Therapeutic exercises, 97530- Therapeutic activity, 97112- Neuromuscular re-education, 97535- Self Care, and [id number]- Manual therapy  PLAN FOR NEXT SESSION: Look to see when MUA is scheduled. Continue focus on knee active range of motion, flexion and extension.  Quadriceps strengthening to improve his ability to descend stairs.   April Manson, PT, DPT 09/05/2023, 1:57 PM d

## 2023-09-07 ENCOUNTER — Encounter: Payer: Self-pay | Admitting: Physical Therapy

## 2023-09-07 ENCOUNTER — Ambulatory Visit (INDEPENDENT_AMBULATORY_CARE_PROVIDER_SITE_OTHER): Payer: Medicare Other | Admitting: Physical Therapy

## 2023-09-07 DIAGNOSIS — M25661 Stiffness of right knee, not elsewhere classified: Secondary | ICD-10-CM | POA: Diagnosis not present

## 2023-09-07 DIAGNOSIS — M25561 Pain in right knee: Secondary | ICD-10-CM

## 2023-09-07 DIAGNOSIS — R600 Localized edema: Secondary | ICD-10-CM

## 2023-09-07 DIAGNOSIS — R262 Difficulty in walking, not elsewhere classified: Secondary | ICD-10-CM

## 2023-09-07 DIAGNOSIS — M6281 Muscle weakness (generalized): Secondary | ICD-10-CM | POA: Diagnosis not present

## 2023-09-07 NOTE — Therapy (Signed)
 OUTPATIENT PHYSICAL THERAPY LOWER EXTREMITY TREATMENT   Patient Name: Ricardo Reese MRN: [id number] DOB:08-04-1950, 73 y.o., male Today's Date: 09/07/2023  END OF SESSION:  PT End of Session - 09/07/23 1406     Visit Number 9    Number of Visits 20    Date for PT Re-Evaluation 10/17/23    Authorization Type MCR and BCBS    PT Start Time 1345    PT Stop Time 1430    PT Time Calculation (min) 45 min    Activity Tolerance Patient tolerated treatment well;No increased pain    Behavior During Therapy WFL for tasks assessed/performed              Past Medical History:  Diagnosis Date   Diabetes mellitus without complication (HCC)    History of kidney stones    has had twice   Hyperlipidemia    Hypertension    Impaired fasting glucose    Kidney stone 1983   Melanoma of back [facility]) 2013   Dr Adolphus Birchwood   Pneumonia    walking back in 2002   Prostate cancer (HCC) 2019   Skin cancer 06/14/2011   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   Past Surgical History:  Procedure Laterality Date   COLONOSCOPY     KNEE SURGERY Right 1969   cartilage removal right knee    melanoma back  09/2010   PROSTATE BIOPSY     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   Procedure: LEFT TOTAL HIP ARTHROPLASTY ANTERIOR APPROACH;  Surgeon: Kathryne Hitch, MD;  Location: [facility] OR;  Service: Orthopedics;  Laterality: Left;   TOTAL KNEE ARTHROPLASTY Right 07/25/2023   Procedure: RIGHT TOTAL KNEE ARTHROPLASTY;  Surgeon: Kathryne Hitch, MD;  Location: [facility] OR;  Service: Orthopedics;  Laterality: Right;   Patient Active Problem List   Diagnosis Date Noted   OA (osteoarthritis) of knee 07/25/2023   Status post total right knee replacement 07/25/2023   Unilateral primary osteoarthritis, right knee 05/29/2023   Cognitive changes 01/24/2023   Left foot drop 12/31/2021   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   Right knee pain 10/25/2019   History of prostate cancer  01/02/2018   Advance directive discussed with patient 09/26/2016   Status post left hip replacement 09/20/2016   Routine general medical examination at a health care facility 08/17/2011   History of melanoma    Hyperlipemia 05/20/2008   Benign essential hypertension 05/20/2008   Type 2 diabetes mellitus with other circulatory complications (HCC) 05/20/2008    PCP: Karie Schwalbe, MD   REFERRING PROVIDER: Kirtland Bouchard, PA-C   REFERRING DIAG: [phone] (ICD-10-CM) - Status post total right knee replacement   THERAPY DIAG:  Acute pain of right knee  Difficulty in walking, not elsewhere classified  Muscle weakness (generalized)  Stiffness of right knee, not elsewhere classified  Localized edema  Rationale for Evaluation and Treatment: Rehabilitation  ONSET DATE: status post right total knee arthroplasty 07/25/2023   SUBJECTIVE:   SUBJECTIVE STATEMENT: He relays he still has not have MUA scheduled yet.  PERTINENT HISTORY: Post op  PAIN:  NPRS scale: d4/10 this week Pain location: Rt anteriror  Pain description: constant, achy, can be sharp Aggravating factors: walking, shower, sleeping on back Relieving factors: rest, meds  PRECAUTIONS: None  RED FLAGS: None   WEIGHT BEARING RESTRICTIONS: No  FALLS:  Has patient fallen in last 6 months? No  LIVING ENVIRONMENT: Has 15 stairs at home, reports  he can do these already without much difficulty  OCCUPATION: retired but works part time at golf [employer]  PLOF: Independent  PATIENT GOALS: reduce pain, get back to normal and get back to golf  NEXT MD VISIT: 09/04/23  OBJECTIVE:  Note: Objective measures were completed at Evaluation unless otherwise noted.  PATIENT SURVEYS:  Eval Patient specific functional scale (0 unable to 10 no difficulty) 1)showering 2/10 2)walking without walker 1/10 Total 3/20, predicted goal >18/20   COGNITION: Overall cognitive status: Within functional limits for tasks  assessed    EDEMA:  Moderate edema Right knee  LOWER EXTREMITY ROM:  AROM/ Passive ROM Right eval Right 08/16/23 Rt 08/22/23 Right 08/24/23 Right 09/10/2023  Hip flexion       Hip extension       Hip abduction       Hip adduction       Hip internal rotation       Hip external rotation       Knee flexion 75/80 80/84 88/90  /104 AAROM 97  Knee extension 10/8  10/ 6  Lacking 6 degrees  Ankle dorsiflexion       Ankle plantarflexion       Ankle inversion       Ankle eversion        (Blank rows = not tested)  LOWER EXTREMITY MMT:  MMT in sitting Right eval Left eval  Hip flexion 4   Hip extension    Hip abduction 4   Hip adduction    Hip internal rotation    Hip external rotation    Knee flexion 4   Knee extension 4   Ankle dorsiflexion    Ankle plantarflexion    Ankle inversion    Ankle eversion     (Blank rows = not tested)  FUNCTIONAL TESTS:  Eval: Sit to stand: unable to perform without UE support  GAIT: Distance walked: 100 Assistive device utilized: Environmental consultant - 2 wheeled Level of assistance: Modified independence Comments: good stability with this but poor without RW, and does not use AD PLOF                                                                                                                                TODAY'S TREATMENT:  09/07/2023 Therapeutic Exercises: Nu step L6 X 10 min Seated knee flexion stretch 5 sec X 10   Therapeutic Activities: Leg press BLEs 87# 10 reps 2 sets with knee flexion stretch 5 sec, then Rt leg only 50# X15 Step ups forward, 4 inch step up with Rt leg and down in front with left leg X 10 with one UE support, cues to move foot toward edge of step to reduce knee flexion needed.  Step ups lateral 4 inch inch step X 10 bilat with UE support Seated knee extension machine 5# Rt leg only 2X10, then 10# X 15 double leg Seated hamstring curl machine DL 15# [id number] Golf chip  shots X 10, small 25% swings in clinic with foam ball and  pitching wedge, able to do this without pain reported .    09/05/2023 Therapeutic Exercises: Nu step L6 X 10 min Seated knee flexion stretch 5 sec X 10     Therapeutic Activities: Pt amb without AD throughout session Leg press BLEs 87# 10 reps 2 sets with knee flexion stretch 5 sec, then Rt leg only 50# X15 Step ups forward, 4 inch step up with Rt leg and down in front with left leg X 10 with one UE support, cues to move foot toward edge of step to reduce knee flexion needed.  Step ups lateral 4 inch inch step X 10 bilat with UE support Seated knee extension machine 5# Rt leg only 2X10, then 10# X 15 double leg Seated hamstring curl machine DL 16# [id number]  0/45/4098 Recumbent bike Seat 9 for 5 minutes AAROM working on flexion Tailgate knee flexion 1 minute Seated knee flexion (left pushes right into flexion) 10 x 10 seconds with physical therapist assistance Quadriceps sets with heel prop 10 x 5 seconds Supine knee flexion with belt 10 x 10 seconds  Functional Activities: Step-down off 4 and 2 inch step with UE assist 2 sets of 10 with slow eccentrics Single leg Press 50# 15 x slow eccentrics full extension to full flexion   08/28/2023 Therapeutic Exercises: Nu step L6 X 10 min Seated knee flexion stretch 5 sec X 10   Therapeutic Activities: Pt amb without AD throughout session Leg press BLEs 87# 10 reps 2 sets with knee flexion stretch 5 sec, then Rt leg only 43# X15 Step ups forward, up with Rt leg and down in front with left leg X 10 with one UE support, cues to move foot toward edge of step to reduce knee flexion needed.  Step ups lateral 6 inch step X 10 bilat with UE support Seated knee extension machine 5# Rt leg only 2X10  Manual Therapy PROM knee flexion with overpressure    PATIENT EDUCATION: Education details: HEP, PT plan of care Person educated: Patient Education method: Explanation, Demonstration, Verbal cues, and Handouts Education comprehension: verbalized  understanding and needs further education   HOME EXERCISE PROGRAM: Access Code: [email or web address] URL: [email or web address] Date: 08/31/2023 Prepared by: Pauletta Browns  Exercises - Supine Heel Slide with Strap  - 2 x daily - 6 x weekly - 1 sets - 10 reps - 5 hold - Seated Long Arc Quad  - 2 x daily - 6 x weekly - 2 sets - 10 reps - 3 sec hold - Seated Knee Flexion Stretch  - 2 x daily - 6 x weekly - 1 sets - 10 reps - 5 sec hold - Seated Straight Leg Raise with Quad Contraction  - 2 x daily - 6 x weekly - 2 sets - 10 reps - Seated Hamstring Stretch  - 2 x daily - 6 x weekly - 1 sets - 3 reps - 30 hold - Sit to Stand  - 2 x daily - 6 x weekly - 1-2 sets - 10 reps - Tandem Stance  - 1 x daily - 7 x weekly - 1 sets - 5 reps - 20 second hold - Supine Quadricep Sets  - 5 x daily - 7 x weekly - 2 sets - 10 reps - 5 second hold  ASSESSMENT:  CLINICAL IMPRESSION: He still does not know when MUA will be. I did recommend he check with the front office downstairs to  inquire about this. We will need to know when this is happening so we can try to to schedule him visits afterwards back to to back days.   OBJECTIVE IMPAIRMENTS: decreased activity tolerance, difficulty walking, decreased balance, decreased endurance, decreased mobility, decreased ROM, decreased strength, impaired flexibility, impaired LE use, and pain.  ACTIVITY LIMITATIONS: bending, lifting, carry, locomotion, cleaning, community activity, driving, and or occupation  PERSONAL FACTORS: see PMH, are also affecting patient's functional outcome.  REHAB POTENTIAL: Good  CLINICAL DECISION MAKING: Stable/uncomplicated  EVALUATION COMPLEXITY: Low    GOALS: Short term PT Goals Target date: 09/05/2023   Pt will be I and compliant with HEP. Baseline:  Goal status: Met 08/31/2023 Pt will decrease pain to less than 4/10 on average Baseline: Goal status:  Ongoing  08/31/2023  Long term PT goals Target date:10/17/2023   Pt  will improve Rt knee AROM 3-110 deg  to improve functional mobility Baseline: Goal status: Ongoing  08/31/2023 Pt will improve  hip/knee strength to at least 5-/5 MMT to improve functional strength Baseline: Goal status: Ongoing  08/16/2023 Pt will improve PSFS to at least 18/20 to show improved function Baseline: Goal status: Ongoing  08/16/2023 Pt will reduce pain to overall less than 2-3/10 with usual activity, work activity and return to golf Baseline: Goal status:  Ongoing  08/31/2023 Pt will be able to ambulate community distances at least 1000 ft WNL gait pattern without complaints Baseline: Goal status: Ongoing  08/31/2023  PLAN: PT FREQUENCY: 1-3 times per week   PT DURATION: 6-10 weeks  PLANNED INTERVENTIONS (unless contraindicated): aquatic PT, Canalith repositioning, cryotherapy, Electrical stimulation, Iontophoresis with 4 mg/ml dexamethasome, Moist heat, traction, Ultrasound, gait training, Therapeutic exercise, balance training, neuromuscular re-education, patient/family education, manual techniques, passive ROM, dry needling, taping, vasopnuematic device, vestibular, spinal manipulations, joint manipulations 97110-Therapeutic exercises, 97530- Therapeutic activity, 97112- Neuromuscular re-education, 97535- Self Care, and 16109- Manual therapy  PLAN FOR NEXT SESSION: Look to see when MUA is scheduled. Continue focus on knee active range of motion, flexion and extension.  Quadriceps strengthening to improve his ability to descend stairs.   April Manson, PT, DPT 09/07/2023, 2:36 PM d

## 2023-09-11 ENCOUNTER — Ambulatory Visit: Admitting: Internal Medicine

## 2023-09-12 ENCOUNTER — Encounter: Payer: Self-pay | Admitting: Physical Therapy

## 2023-09-12 ENCOUNTER — Ambulatory Visit (INDEPENDENT_AMBULATORY_CARE_PROVIDER_SITE_OTHER): Payer: Medicare Other | Admitting: Physical Therapy

## 2023-09-12 DIAGNOSIS — M6281 Muscle weakness (generalized): Secondary | ICD-10-CM

## 2023-09-12 DIAGNOSIS — M25661 Stiffness of right knee, not elsewhere classified: Secondary | ICD-10-CM

## 2023-09-12 DIAGNOSIS — R262 Difficulty in walking, not elsewhere classified: Secondary | ICD-10-CM | POA: Diagnosis not present

## 2023-09-12 DIAGNOSIS — M25561 Pain in right knee: Secondary | ICD-10-CM

## 2023-09-12 DIAGNOSIS — R600 Localized edema: Secondary | ICD-10-CM

## 2023-09-12 NOTE — Therapy (Signed)
 OUTPATIENT PHYSICAL THERAPY LOWER EXTREMITY TREATMENT RE-CERTIFICATION   Patient Name: Ricardo Reese MRN: [id number] DOB:08-18-1950, 73 y.o., male Today's Date: 09/12/2023  END OF SESSION:  PT End of Session - 09/12/23 1400     Visit Number 10    Number of Visits 30    Date for PT Re-Evaluation 10/17/23    Authorization Type MCR and BCBS    PT Start Time 1345    PT Stop Time 1430    PT Time Calculation (min) 45 min    Activity Tolerance Patient tolerated treatment well;No increased pain    Behavior During Therapy WFL for tasks assessed/performed              Past Medical History:  Diagnosis Date   Diabetes mellitus without complication (HCC)    History of kidney stones    has had twice   Hyperlipidemia    Hypertension    Impaired fasting glucose    Kidney stone 1983   Melanoma of back [facility]) 2013   Dr Adolphus Birchwood   Pneumonia    walking back in 2002   Prostate cancer (HCC) 2019   Skin cancer 06/14/2011   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   Past Surgical History:  Procedure Laterality Date   COLONOSCOPY     KNEE SURGERY Right 1969   cartilage removal right knee    melanoma back  09/2010   PROSTATE BIOPSY     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   Procedure: LEFT TOTAL HIP ARTHROPLASTY ANTERIOR APPROACH;  Surgeon: Kathryne Hitch, MD;  Location: [facility] OR;  Service: Orthopedics;  Laterality: Left;   TOTAL KNEE ARTHROPLASTY Right 07/25/2023   Procedure: RIGHT TOTAL KNEE ARTHROPLASTY;  Surgeon: Kathryne Hitch, MD;  Location: [facility] OR;  Service: Orthopedics;  Laterality: Right;   Patient Active Problem List   Diagnosis Date Noted   OA (osteoarthritis) of knee 07/25/2023   Status post total right knee replacement 07/25/2023   Unilateral primary osteoarthritis, right knee 05/29/2023   Cognitive changes 01/24/2023   Left foot drop 12/31/2021   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   Right knee pain 10/25/2019   History of  prostate cancer 01/02/2018   Advance directive discussed with patient 09/26/2016   Status post left hip replacement 09/20/2016   Routine general medical examination at a health care facility 08/17/2011   History of melanoma    Hyperlipemia 05/20/2008   Benign essential hypertension 05/20/2008   Type 2 diabetes mellitus with other circulatory complications (HCC) 05/20/2008    PCP: Karie Schwalbe, MD   REFERRING PROVIDER: Kirtland Bouchard, PA-C   REFERRING DIAG: [phone] (ICD-10-CM) - Status post total right knee replacement   THERAPY DIAG:  Acute pain of right knee  Muscle weakness (generalized)  Difficulty in walking, not elsewhere classified  Stiffness of right knee, not elsewhere classified  Localized edema  Rationale for Evaluation and Treatment: Rehabilitation  ONSET DATE: status post right total knee arthroplasty 07/25/2023   SUBJECTIVE:   SUBJECTIVE STATEMENT: Pt is waiting to here about manipulation scheduling.    PERTINENT HISTORY: Post op  PAIN:  NPRS scale:  no pain at rest, 2-3/10 with certain activities Pain location: Rt anteriror  Pain description: constant, achy, can be sharp Aggravating factors: walking, shower, sleeping on back Relieving factors: rest, meds  PRECAUTIONS: None  RED FLAGS: None   WEIGHT BEARING RESTRICTIONS: No  FALLS:  Has patient fallen in last 6 months? 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  PATIENT SURVEYS:  Eval Patient specific functional scale (0 unable to 10 no difficulty) 1)showering 2/10 2)walking without walker 1/10 Total 3/20, predicted goal >18/20   COGNITION: Overall  cognitive status: Within functional limits for tasks assessed    EDEMA:  Moderate edema Right knee  LOWER EXTREMITY ROM:  AROM/ Passive ROM Right eval Right 08/16/23 Rt 08/22/23 Right 08/24/23 Right 09/10/2023 Rt 09/12/23  Hip flexion        Hip extension        Hip abduction        Hip adduction        Hip internal rotation        Hip external rotation        Knee flexion 75/80 80/84 88/90  /104 AAROM 97 Passive 96  Knee extension 10/8  10/ 6  Lacking 6 degrees Passive -5  Ankle dorsiflexion        Ankle plantarflexion        Ankle inversion        Ankle eversion         (Blank rows = not tested)  LOWER EXTREMITY MMT:  MMT in sitting Right eval Left eval  Hip flexion 4   Hip extension    Hip abduction 4   Hip adduction    Hip internal rotation    Hip external rotation    Knee flexion 4   Knee extension 4   Ankle dorsiflexion    Ankle plantarflexion    Ankle inversion    Ankle eversion     (Blank rows = not tested)  FUNCTIONAL TESTS:  Eval: Sit to stand: unable to perform without UE support  GAIT: Distance walked: 100 Assistive device utilized: Walker - 2 wheeled Level of assistance: Modified independence Comments: good stability with this but poor without RW, and does not use AD PLOF                                                                                                                                TODAY'S TREATMENT:  09/12/23:  TherEx: Recumbent bike: x 9 minutes Leg extension: 10# Rt LE 2 x 10  Leg Curl machine: 20# Rt LE 2 x 10 TherActivites:  Leg press: single leg 75# 3 x 10 Leg Press: calf raises 37# bil LE 2 x 10  TRX: 2 x 15 holding end range 5 sec Step up on 8 inch step x 20 leading with Rt LE      09/07/2023 Therapeutic Exercises: Nu step L6 X 10 min Seated knee flexion stretch 5 sec X 10  Therapeutic Activities: Leg press BLEs 87# 10 reps 2 sets with knee flexion stretch 5 sec, then Rt leg only 50# X15 Step ups forward, 4  inch step up with Rt leg and down in front with left leg X 10 with one UE support, cues to move foot toward edge of step to reduce knee flexion needed.  Step ups lateral 4 inch inch step X 10 bilat with UE support Seated knee extension machine 5# Rt leg only 2X10, then 10# X 15 double leg Seated hamstring curl machine DL 16# [id number] Golf chip shots X 10, small 25% swings in clinic with foam ball and pitching wedge, able to do this without pain reported .    09/05/2023 Therapeutic Exercises: Nu step L6 X 10 min Seated knee flexion stretch 5 sec X 10     Therapeutic Activities: Pt amb without AD throughout session Leg press BLEs 87# 10 reps 2 sets with knee flexion stretch 5 sec, then Rt leg only 50# X15 Step ups forward, 4 inch step up with Rt leg and down in front with left leg X 10 with one UE support, cues to move foot toward edge of step to reduce knee flexion needed.  Step ups lateral 4 inch inch step X 10 bilat with UE support Seated knee extension machine 5# Rt leg only 2X10, then 10# X 15 double leg Seated hamstring curl machine DL 04# [id number]  01/21/9146 Recumbent bike Seat 9 for 5 minutes AAROM working on flexion Tailgate knee flexion 1 minute Seated knee flexion (left pushes right into flexion) 10 x 10 seconds with physical therapist assistance Quadriceps sets with heel prop 10 x 5 seconds Supine knee flexion with belt 10 x 10 seconds  Functional Activities: Step-down off 4 and 2 inch step with UE assist 2 sets of 10 with slow eccentrics Single leg Press 50# 15 x slow eccentrics full extension to full flexion   08/28/2023 Therapeutic Exercises: Nu step L6 X 10 min Seated knee flexion stretch 5 sec X 10   Therapeutic Activities: Pt amb without AD throughout session Leg press BLEs 87# 10 reps 2 sets with knee flexion stretch 5 sec, then Rt leg only 43# X15 Step ups forward, up with Rt leg and down in front with left leg X 10 with one UE support, cues to move foot toward edge  of step to reduce knee flexion needed.  Step ups lateral 6 inch step X 10 bilat with UE support Seated knee extension machine 5# Rt leg only 2X10  Manual Therapy PROM knee flexion with overpressure    PATIENT EDUCATION: Education details: HEP, PT plan of care Person educated: Patient Education method: Explanation, Demonstration, Verbal cues, and Handouts Education comprehension: verbalized understanding and needs further education   HOME EXERCISE PROGRAM: Access Code: [email or web address] URL: [email or web address] Date: 08/31/2023 Prepared by: Pauletta Browns  Exercises - Supine Heel Slide with Strap  - 2 x daily - 6 x weekly - 1 sets - 10 reps - 5 hold - Seated Long Arc Quad  - 2 x daily - 6 x weekly - 2 sets - 10 reps - 3 sec hold - Seated Knee Flexion Stretch  - 2 x daily - 6 x weekly - 1 sets - 10 reps - 5 sec hold - Seated Straight Leg Raise with Quad Contraction  - 2 x daily - 6 x weekly - 2 sets - 10 reps - Seated Hamstring Stretch  - 2 x daily - 6 x weekly - 1  sets - 3 reps - 30 hold - Sit to Stand  - 2 x daily - 6 x weekly - 1-2 sets - 10 reps - Tandem Stance  - 1 x daily - 7 x weekly - 1 sets - 5 reps - 20 second hold - Supine Quadricep Sets  - 5 x daily - 7 x weekly - 2 sets - 10 reps - 5 second hold  ASSESSMENT:  CLINICAL IMPRESSION: Pt tolerating exercises well with passive Rt knee flexion to 96 degrees today. Pt still struggling with active ROM. Pt's manipulation was scheduled for 09/14/23 at 8:00 am. Pt will be seen by PT at 1:45 that afternoon. I'm sending re-certification for new frequency.   OBJECTIVE IMPAIRMENTS: decreased activity tolerance, difficulty walking, decreased balance, decreased endurance, decreased mobility, decreased ROM, decreased strength, impaired flexibility, impaired LE use, and pain.  ACTIVITY LIMITATIONS: bending, lifting, carry, locomotion, cleaning, community activity, driving, and or occupation  PERSONAL FACTORS: see PMH, are also  affecting patient's functional outcome.  REHAB POTENTIAL: Good  CLINICAL DECISION MAKING: Stable/uncomplicated  EVALUATION COMPLEXITY: Low    GOALS: Short term PT Goals Target date: 09/05/2023   Pt will be I and compliant with HEP. Baseline:  Goal status: Met 08/31/2023 Pt will decrease pain to less than 4/10 on average Baseline: Goal status:  MET 09/12/23  Long term PT goals Target date: 10/27/2023   Pt will improve Rt knee AROM 3-110 deg  to improve functional mobility Baseline: Goal status: Ongoing 09/12/23 Pt will improve  hip/knee strength to at least 5-/5 MMT to improve functional strength Baseline: Goal status: Ongoing 09/12/23 Pt will improve PSFS to at least 18/20 to show improved function Baseline: Goal status: Ongoing 09/12/23 Pt will reduce pain to overall less than 2-3/10 with usual activity, work activity and return to golf Baseline: Goal status:  Ongoing 09/12/23 Pt will be able to ambulate community distances at least 1000 ft WNL gait pattern without complaints Baseline: Goal status: Ongoing 09/12/23  PLAN: PT FREQUENCY: 2-5 times per week beginning with 5 x/ week following manipulation and tapering down as appropriate.   PT DURATION: 6 weeks  PLANNED INTERVENTIONS (unless contraindicated): aquatic PT, Canalith repositioning, cryotherapy, Electrical stimulation, Iontophoresis with 4 mg/ml dexamethasome, Moist heat, traction, Ultrasound, gait training, Therapeutic exercise, balance training, neuromuscular re-education, patient/family education, manual techniques, passive ROM, dry needling, taping, vasopnuematic device, vestibular, spinal manipulations, joint manipulations 97110-Therapeutic exercises, 97530- Therapeutic activity, 97112- Neuromuscular re-education, 97535- Self Care, and 40981- Manual therapy  PLAN FOR NEXT SESSION:  Continue focus on knee active range of motion, flexion and extension.  Quadriceps strengthening to improve his ability to descend  stairs.  Re-certification sent for new frequency on 09/12/23 following manipulation  Sharmon Leyden, PT, MPT 09/12/2023, 2:49 PM

## 2023-09-14 ENCOUNTER — Encounter: Payer: Self-pay | Admitting: Rehabilitative and Restorative Service Providers"

## 2023-09-14 ENCOUNTER — Ambulatory Visit (INDEPENDENT_AMBULATORY_CARE_PROVIDER_SITE_OTHER): Payer: Medicare Other | Admitting: Rehabilitative and Restorative Service Providers"

## 2023-09-14 ENCOUNTER — Other Ambulatory Visit: Payer: Self-pay | Admitting: Orthopaedic Surgery

## 2023-09-14 DIAGNOSIS — M25561 Pain in right knee: Secondary | ICD-10-CM

## 2023-09-14 DIAGNOSIS — M25661 Stiffness of right knee, not elsewhere classified: Secondary | ICD-10-CM | POA: Diagnosis not present

## 2023-09-14 DIAGNOSIS — R262 Difficulty in walking, not elsewhere classified: Secondary | ICD-10-CM | POA: Diagnosis not present

## 2023-09-14 DIAGNOSIS — G8918 Other acute postprocedural pain: Secondary | ICD-10-CM | POA: Diagnosis not present

## 2023-09-14 DIAGNOSIS — M6281 Muscle weakness (generalized): Secondary | ICD-10-CM

## 2023-09-14 DIAGNOSIS — R600 Localized edema: Secondary | ICD-10-CM | POA: Diagnosis not present

## 2023-09-14 DIAGNOSIS — M24661 Ankylosis, right knee: Secondary | ICD-10-CM

## 2023-09-14 MED ORDER — HYDROCODONE-ACETAMINOPHEN 5-325 MG PO TABS
1.0000 | ORAL_TABLET | Freq: Four times a day (QID) | ORAL | 0 refills | Status: DC | PRN
Start: 2023-09-14 — End: 2023-11-13

## 2023-09-14 NOTE — Therapy (Signed)
 OUTPATIENT PHYSICAL THERAPY TREATMENT   Patient Name: Ricardo Reese MRN: [id number] DOB:11-11-1950, 73 y.o., male Today's Date: 09/14/2023  END OF SESSION:  PT End of Session - 09/14/23 1352     Visit Number 11    Number of Visits 30    Date for PT Re-Evaluation 10/27/23    Authorization Type MCR and BCBS    Progress Note Due on Visit 20    PT Start Time 1343    PT Stop Time 1422    PT Time Calculation (min) 39 min    Activity Tolerance Patient tolerated treatment well    Behavior During Therapy WFL for tasks assessed/performed               Past Medical History:  Diagnosis Date   Diabetes mellitus without complication (HCC)    History of kidney stones    has had twice   Hyperlipidemia    Hypertension    Impaired fasting glucose    Kidney stone 1983   Melanoma of back [facility]) 2013   Dr Adolphus Birchwood   Pneumonia    walking back in 2002   Prostate cancer (HCC) 2019   Skin cancer 06/14/2011   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   Past Surgical History:  Procedure Laterality Date   COLONOSCOPY     KNEE SURGERY Right 1969   cartilage removal right knee    melanoma back  09/2010   PROSTATE BIOPSY     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   Procedure: LEFT TOTAL HIP ARTHROPLASTY ANTERIOR APPROACH;  Surgeon: Kathryne Hitch, MD;  Location: [facility] OR;  Service: Orthopedics;  Laterality: Left;   TOTAL KNEE ARTHROPLASTY Right 07/25/2023   Procedure: RIGHT TOTAL KNEE ARTHROPLASTY;  Surgeon: Kathryne Hitch, MD;  Location: [facility] OR;  Service: Orthopedics;  Laterality: Right;   Patient Active Problem List   Diagnosis Date Noted   OA (osteoarthritis) of knee 07/25/2023   Status post total right knee replacement 07/25/2023   Unilateral primary osteoarthritis, right knee 05/29/2023   Cognitive changes 01/24/2023   Left foot drop 12/31/2021   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   Right knee pain 10/25/2019   History of prostate cancer  01/02/2018   Advance directive discussed with patient 09/26/2016   Status post left hip replacement 09/20/2016   Routine general medical examination at a health care facility 08/17/2011   History of melanoma    Hyperlipemia 05/20/2008   Benign essential hypertension 05/20/2008   Type 2 diabetes mellitus with other circulatory complications (HCC) 05/20/2008    PCP: Karie Schwalbe, MD   REFERRING PROVIDER: Kirtland Bouchard, PA-C   REFERRING DIAG: [phone] (ICD-10-CM) - Status post total right knee replacement   THERAPY DIAG:  Acute pain of right knee  Muscle weakness (generalized)  Difficulty in walking, not elsewhere classified  Stiffness of right knee, not elsewhere classified  Localized edema  Rationale for Evaluation and Treatment: Rehabilitation  ONSET DATE: status post right total knee arthroplasty 07/25/2023   SUBJECTIVE:   SUBJECTIVE STATEMENT: Pt indicated no pain at rest.  Had manipulation this morning.  Pt indicated doing ok right now.    PERTINENT HISTORY: Post op  PAIN:  NPRS scale:  no pain at rest Pain location: Rt anteriror  Pain description: constant, achy, can be sharp Aggravating factors: walking, shower, sleeping on back Relieving factors: rest, meds  PRECAUTIONS: None  RED FLAGS: None   WEIGHT BEARING RESTRICTIONS: No  FALLS:  Has patient fallen in last 6 months? No  LIVING ENVIRONMENT: Has 15 stairs at home, reports he can do these already without much difficulty  OCCUPATION: retired but works part time at [employer]  PLOF: Independent  PATIENT GOALS: reduce pain, get back to normal and get back to golf  NEXT MD VISIT: 09/04/23  OBJECTIVE:  Note: Objective measures were completed at Evaluation unless otherwise noted.  PATIENT SURVEYS:  Eval Patient specific functional scale (0 unable to 10 no difficulty) 1)showering 2/10 2)walking without walker 1/10 Total 3/20, predicted goal >18/20   COGNITION: Overall cognitive  status: Within functional limits for tasks assessed    EDEMA:  Moderate edema Right knee  LOWER EXTREMITY ROM:  AROM/ Passive ROM Right eval Right 08/16/23 Rt 08/22/23 Right 08/24/23 Right 09/10/2023 Rt 09/12/23 Right 09/14/2023  Hip flexion         Hip extension         Hip abduction         Hip adduction         Hip internal rotation         Hip external rotation         Knee flexion 75/80 80/84 88/90  /104 AAROM 97 Passive 96 Supine AROM heel slide 106  Knee extension 10/8  10/ 6  Lacking 6 degrees Passive -5   Ankle dorsiflexion         Ankle plantarflexion         Ankle inversion         Ankle eversion          (Blank rows = not tested)  LOWER EXTREMITY MMT:  MMT in sitting Right eval Left eval  Hip flexion 4   Hip extension    Hip abduction 4   Hip adduction    Hip internal rotation    Hip external rotation    Knee flexion 4   Knee extension 4   Ankle dorsiflexion    Ankle plantarflexion    Ankle inversion    Ankle eversion     (Blank rows = not tested)  FUNCTIONAL TESTS:  Eval: Sit to stand: unable to perform without UE support  GAIT: 09/14/2023:  Independent ambulation  Eval:  Distance walked: 100 Assistive device utilized: Walker - 2 wheeled Level of assistance: Modified independence Comments: good stability with this but poor without RW, and does not use AD PLOF                 TODAY'S TREATMENT:      DATE:09/14/2023   Therex: UBE LE only for ROM lvl 3.0 10 mins Seat 13, then seat 12 after 2 mins starting.  Supine Rt knee LAQ in 90 deg hip flexion 2 x 10  Supine heel slide AROM x 10  Supine heel prop 3 mins for extension with cues for home use.   TherActivity (to improve stairs, transfers) Leg press double leg 100 lbs x20,  single leg Rt 50 lbs 2 x 15 Flight of stairs down with bilateral UE assist, reciprocal gait pattern with SBA   Manual Rt knee seated flexion c distraction IR/mobilizations.    TODAY'S TREATMENT:      DATE: 09/12/23:   TherEx: Recumbent bike: x 9 minutes Leg extension: 10# Rt LE 2 x 10  Leg Curl machine: 20# Rt LE 2 x 10 TherActivites:  Leg press: single leg 75# 3 x 10 Leg Press: calf raises 37# bil LE 2 x 10  TRX: 2 x 15  holding end range 5 sec Step up on 8 inch step x 20 leading with Rt LE   TODAY'S TREATMENT:      DATE3/27/2025 Therapeutic Exercises: Nu step L6 X 10 min Seated knee flexion stretch 5 sec X 10   Therapeutic Activities: Leg press BLEs 87# 10 reps 2 sets with knee flexion stretch 5 sec, then Rt leg only 50# X15 Step ups forward, 4 inch step up with Rt leg and down in front with left leg X 10 with one UE support, cues to move foot toward edge of step to reduce knee flexion needed.  Step ups lateral 4 inch inch step X 10 bilat with UE support Seated knee extension machine 5# Rt leg only 2X10, then 10# X 15 double leg Seated hamstring curl machine DL 40# [id number] Golf chip shots X 10, small 25% swings in clinic with foam ball and pitching wedge, able to do this without pain reported .    TODAY'S TREATMENT:      DATE3/25/2025 Therapeutic Exercises: Nu step L6 X 10 min Seated knee flexion stretch 5 sec X 10     Therapeutic Activities: Pt amb without AD throughout session Leg press BLEs 87# 10 reps 2 sets with knee flexion stretch 5 sec, then Rt leg only 50# X15 Step ups forward, 4 inch step up with Rt leg and down in front with left leg X 10 with one UE support, cues to move foot toward edge of step to reduce knee flexion needed.  Step ups lateral 4 inch inch step X 10 bilat with UE support Seated knee extension machine 5# Rt leg only 2X10, then 10# X 15 double leg Seated hamstring curl machine DL 25# [id number]   PATIENT EDUCATION: Education details: HEP, PT plan of care Person educated: Patient Education method: Explanation, Demonstration, Verbal cues, and Handouts Education comprehension: verbalized understanding and needs further education   HOME EXERCISE PROGRAM: Access Code:  [email or web address] URL: [email or web address] Date: 08/31/2023 Prepared by: Pauletta Browns  Exercises - Supine Heel Slide with Strap  - 2 x daily - 6 x weekly - 1 sets - 10 reps - 5 hold - Seated Long Arc Quad  - 2 x daily - 6 x weekly - 2 sets - 10 reps - 3 sec hold - Seated Knee Flexion Stretch  - 2 x daily - 6 x weekly - 1 sets - 10 reps - 5 sec hold - Seated Straight Leg Raise with Quad Contraction  - 2 x daily - 6 x weekly - 2 sets - 10 reps - Seated Hamstring Stretch  - 2 x daily - 6 x weekly - 1 sets - 3 reps - 30 hold - Sit to Stand  - 2 x daily - 6 x weekly - 1-2 sets - 10 reps - Tandem Stance  - 1 x daily - 7 x weekly - 1 sets - 5 reps - 20 second hold - Supine Quadricep Sets  - 5 x daily - 7 x weekly - 2 sets - 10 reps - 5 second hold  ASSESSMENT:  CLINICAL IMPRESSION: Good gain noted in Rt knee flexion today compared to pre manipulation measurement. Pt to continue to benefit from mobility intervention to help maintain gains.   OBJECTIVE IMPAIRMENTS: decreased activity tolerance, difficulty walking, decreased balance, decreased endurance, decreased mobility, decreased ROM, decreased strength, impaired flexibility, impaired LE use, and pain.  ACTIVITY LIMITATIONS: bending, lifting, carry, locomotion, cleaning, community activity, driving, and or occupation  PERSONAL FACTORS: see PMH, are  also affecting patient's functional outcome.  REHAB POTENTIAL: Good  CLINICAL DECISION MAKING: Stable/uncomplicated  EVALUATION COMPLEXITY: Low    GOALS: Short term PT Goals Target date: 09/05/2023   Pt will be I and compliant with HEP. Baseline:  Goal status: Met 08/31/2023 Pt will decrease pain to less than 4/10 on average Baseline: Goal status:  MET 09/12/23  Long term PT goals Target date: 10/27/2023   Pt will improve Rt knee AROM 3-110 deg  to improve functional mobility Baseline: Goal status: Ongoing 09/12/23 Pt will improve  hip/knee strength to at least 5-/5 MMT to  improve functional strength Baseline: Goal status: Ongoing 09/12/23 Pt will improve PSFS to at least 18/20 to show improved function Baseline: Goal status: Ongoing 09/12/23 Pt will reduce pain to overall less than 2-3/10 with usual activity, work activity and return to golf Baseline: Goal status:  Ongoing 09/12/23 Pt will be able to ambulate community distances at least 1000 ft WNL gait pattern without complaints Baseline: Goal status: Ongoing 09/12/23  PLAN: PT FREQUENCY: 2-5 times per week beginning with 5 x/ week following manipulation and tapering down as appropriate.   PT DURATION: 6 weeks  PLANNED INTERVENTIONS (unless contraindicated): aquatic PT, Canalith repositioning, cryotherapy, Electrical stimulation, Iontophoresis with 4 mg/ml dexamethasome, Moist heat, traction, Ultrasound, gait training, Therapeutic exercise, balance training, neuromuscular re-education, patient/family education, manual techniques, passive ROM, dry needling, taping, vasopnuematic device, vestibular, spinal manipulations, joint manipulations 97110-Therapeutic exercises, 97530- Therapeutic activity, 97112- Neuromuscular re-education, 97535- Self Care, and 16109- Manual therapy  PLAN FOR NEXT SESSION:  Progressive strengthening, mobility gains.    Re-certification sent for new frequency on 09/12/23 following manipulation   Chyrel Masson, PT, DPT, OCS, ATC 09/14/23  2:33 PM

## 2023-09-14 NOTE — Therapy (Incomplete)
 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   Past Surgical History:  Procedure Laterality Date   COLONOSCOPY     KNEE SURGERY Right 1969   cartilage removal right knee    melanoma back  09/2010   PROSTATE BIOPSY     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   Procedure: LEFT TOTAL HIP ARTHROPLASTY ANTERIOR APPROACH;  Surgeon: Kathryne Hitch, MD;  Location: [facility] OR;  Service: Orthopedics;  Laterality: Left;   TOTAL KNEE ARTHROPLASTY Right 07/25/2023   Procedure: RIGHT TOTAL KNEE ARTHROPLASTY;  Surgeon: Kathryne Hitch, MD;  Location: [facility] OR;  Service: Orthopedics;  Laterality: Right;   Patient Active Problem List   Diagnosis Date Noted   OA (osteoarthritis) of knee 07/25/2023   Status post total right knee replacement 07/25/2023   Unilateral primary osteoarthritis, right knee 05/29/2023   Cognitive changes 01/24/2023   Left foot drop 12/31/2021   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   Right knee pain 10/25/2019   History of prostate cancer 01/02/2018   Advance directive discussed with patient 09/26/2016   Status post left hip replacement 09/20/2016   Routine general medical examination at a health care facility 08/17/2011   History of melanoma    Hyperlipemia 05/20/2008   Benign essential hypertension 05/20/2008   Type 2 diabetes mellitus with other circulatory complications (HCC) 05/20/2008    PCP: Karie Schwalbe,  MD   REFERRING PROVIDER: Kirtland Bouchard, PA-C   REFERRING DIAG: [phone] (ICD-10-CM) - Status post total right knee replacement   THERAPY DIAG:  No diagnosis found.  Rationale for Evaluation and Treatment: Rehabilitation  ONSET DATE: status post right total knee arthroplasty 07/25/2023   SUBJECTIVE:   SUBJECTIVE STATEMENT: *** Pt is waiting to here about manipulation scheduling.    PERTINENT HISTORY: Post op  PAIN:  NPRS scale:  no pain at rest, 2-3/10 with certain activities *** Pain location: Rt anteriror  Pain description: constant, achy, can be sharp Aggravating factors: walking, shower, sleeping on back Relieving factors: rest, meds  PRECAUTIONS: None  RED FLAGS: None   WEIGHT BEARING RESTRICTIONS: No  FALLS:  Has patient fallen in last 6 months? No  LIVING ENVIRONMENT: Has 15 stairs at home, reports he can do these already without much difficulty  OCCUPATION: retired but works part time at golf [employer]  PLOF: Independent  PATIENT GOALS: reduce pain, get back to normal and get back to golf  NEXT MD VISIT: 09/04/23  OBJECTIVE:  Note: Objective measures were completed at Evaluation unless otherwise noted.  PATIENT SURVEYS:  Eval Patient specific functional scale (0 unable to 10 no difficulty) 1)showering 2/10 2)walking without walker 1/10 Total 3/20, predicted goal >18/20   COGNITION: Overall cognitive status: Within functional limits for tasks assessed    EDEMA:  Moderate edema Right knee  LOWER EXTREMITY ROM:  AROM/ Passive ROM Right  eval Right 08/16/23 Rt 08/22/23 Right 08/24/23 Right 09/10/2023 Rt 09/12/23  Hip flexion        Hip extension        Hip abduction        Hip adduction        Hip internal rotation        Hip external rotation        Knee flexion 75/80 80/84 88/90  /104 AAROM 97 Passive 96  Knee extension 10/8  10/ 6  Lacking 6 degrees Passive -5  Ankle dorsiflexion        Ankle plantarflexion        Ankle inversion         Ankle eversion         (Blank rows = not tested)  LOWER EXTREMITY MMT:  MMT in sitting Right eval Left eval  Hip flexion 4   Hip extension    Hip abduction 4   Hip adduction    Hip internal rotation    Hip external rotation    Knee flexion 4   Knee extension 4   Ankle dorsiflexion    Ankle plantarflexion    Ankle inversion    Ankle eversion     (Blank rows = not tested)  FUNCTIONAL TESTS:  Eval: Sit to stand: unable to perform without UE support  GAIT: Distance walked: 100 Assistive device utilized: Environmental consultant - 2 wheeled Level of assistance: Modified independence Comments: good stability with this but poor without RW, and does not use AD PLOF                 TODAY'S TREATMENT:      DATE:09/15/2023   Therex: ***  TherActivity  Manual       TODAY'S TREATMENT:      DATE: 09/12/23:  TherEx: Recumbent bike: x 9 minutes Leg extension: 10# Rt LE 2 x 10  Leg Curl machine: 20# Rt LE 2 x 10 TherActivites:  Leg press: single leg 75# 3 x 10 Leg Press: calf raises 37# bil LE 2 x 10  TRX: 2 x 15 holding end range 5 sec Step up on 8 inch step x 20 leading with Rt LE   TODAY'S TREATMENT:      DATE3/27/2025 Therapeutic Exercises: Nu step L6 X 10 min Seated knee flexion stretch 5 sec X 10   Therapeutic Activities: Leg press BLEs 87# 10 reps 2 sets with knee flexion stretch 5 sec, then Rt leg only 50# X15 Step ups forward, 4 inch step up with Rt leg and down in front with left leg X 10 with one UE support, cues to move foot toward edge of step to reduce knee flexion needed.  Step ups lateral 4 inch inch step X 10 bilat with UE support Seated knee extension machine 5# Rt leg only 2X10, then 10# X 15 double leg Seated hamstring curl machine DL 16# [id number] Golf chip shots X 10, small 25% swings in clinic with foam ball and pitching wedge, able to do this without pain reported .    TODAY'S TREATMENT:      DATE3/25/2025 Therapeutic Exercises: Nu step L6 X 10 min Seated  knee flexion stretch 5 sec X 10     Therapeutic Activities: Pt amb without AD throughout session Leg press BLEs 87# 10 reps 2 sets with knee flexion stretch 5 sec, then Rt leg only 50# X15 Step ups forward, 4 inch step up with Rt leg and down in front with left leg X  10 with one UE support, cues to move foot toward edge of step to reduce knee flexion needed.  Step ups lateral 4 inch inch step X 10 bilat with UE support Seated knee extension machine 5# Rt leg only 2X10, then 10# X 15 double leg Seated hamstring curl machine DL 16# [id number]   PATIENT EDUCATION: Education details: HEP, PT plan of care Person educated: Patient Education method: Explanation, Demonstration, Verbal cues, and Handouts Education comprehension: verbalized understanding and needs further education   HOME EXERCISE PROGRAM: Access Code: [email or web address] URL: [email or web address] Date: 08/31/2023 Prepared by: Pauletta Browns  Exercises - Supine Heel Slide with Strap  - 2 x daily - 6 x weekly - 1 sets - 10 reps - 5 hold - Seated Long Arc Quad  - 2 x daily - 6 x weekly - 2 sets - 10 reps - 3 sec hold - Seated Knee Flexion Stretch  - 2 x daily - 6 x weekly - 1 sets - 10 reps - 5 sec hold - Seated Straight Leg Raise with Quad Contraction  - 2 x daily - 6 x weekly - 2 sets - 10 reps - Seated Hamstring Stretch  - 2 x daily - 6 x weekly - 1 sets - 3 reps - 30 hold - Sit to Stand  - 2 x daily - 6 x weekly - 1-2 sets - 10 reps - Tandem Stance  - 1 x daily - 7 x weekly - 1 sets - 5 reps - 20 second hold - Supine Quadricep Sets  - 5 x daily - 7 x weekly - 2 sets - 10 reps - 5 second hold  ASSESSMENT:  CLINICAL IMPRESSION: *** Pt tolerating exercises well with passive Rt knee flexion to 96 degrees today. Pt still struggling with active ROM. Pt's manipulation was scheduled for 09/14/23 at 8:00 am. Pt will be seen by PT at 1:45 that afternoon. I'm sending re-certification for new frequency.   OBJECTIVE IMPAIRMENTS:  decreased activity tolerance, difficulty walking, decreased balance, decreased endurance, decreased mobility, decreased ROM, decreased strength, impaired flexibility, impaired LE use, and pain.  ACTIVITY LIMITATIONS: bending, lifting, carry, locomotion, cleaning, community activity, driving, and or occupation  PERSONAL FACTORS: see PMH, are also affecting patient's functional outcome.  REHAB POTENTIAL: Good  CLINICAL DECISION MAKING: Stable/uncomplicated  EVALUATION COMPLEXITY: Low    GOALS: Short term PT Goals Target date: 09/05/2023   Pt will be I and compliant with HEP. Baseline:  Goal status: Met 08/31/2023 Pt will decrease pain to less than 4/10 on average Baseline: Goal status:  MET 09/12/23  Long term PT goals Target date: 10/27/2023   Pt will improve Rt knee AROM 3-110 deg  to improve functional mobility Baseline: Goal status: Ongoing 09/12/23 Pt will improve  hip/knee strength to at least 5-/5 MMT to improve functional strength Baseline: Goal status: Ongoing 09/12/23 Pt will improve PSFS to at least 18/20 to show improved function Baseline: Goal status: Ongoing 09/12/23 Pt will reduce pain to overall less than 2-3/10 with usual activity, work activity and return to golf Baseline: Goal status:  Ongoing 09/12/23 Pt will be able to ambulate community distances at least 1000 ft WNL gait pattern without complaints Baseline: Goal status: Ongoing 09/12/23  PLAN: PT FREQUENCY: 2-5 times per week beginning with 5 x/ week following manipulation and tapering down as appropriate.   PT DURATION: 6 weeks  PLANNED INTERVENTIONS (unless contraindicated): aquatic PT, Canalith repositioning, cryotherapy, Electrical stimulation, Iontophoresis with 4 mg/ml dexamethasome, Moist heat, traction, Ultrasound, gait  training, Therapeutic exercise, balance training, neuromuscular re-education, patient/family education, manual techniques, passive ROM, dry needling, taping, vasopnuematic device,  vestibular, spinal manipulations, joint manipulations 97110-Therapeutic exercises, 97530- Therapeutic activity, 97112- Neuromuscular re-education, 97535- Self Care, and 16109- Manual therapy  PLAN FOR NEXT SESSION:   *** Continue focus on knee active range of motion, flexion and extension.  Quadriceps strengthening to improve his ability to descend stairs.    Re-certification sent for new frequency on 09/12/23 following manipulation   Clarita Crane, PT, DPT 09/14/23 1:43 PM

## 2023-09-15 ENCOUNTER — Encounter: Payer: Self-pay | Admitting: Physical Therapy

## 2023-09-15 ENCOUNTER — Ambulatory Visit: Admitting: Physical Therapy

## 2023-09-15 DIAGNOSIS — M25561 Pain in right knee: Secondary | ICD-10-CM

## 2023-09-15 DIAGNOSIS — R2689 Other abnormalities of gait and mobility: Secondary | ICD-10-CM | POA: Diagnosis not present

## 2023-09-15 DIAGNOSIS — M6281 Muscle weakness (generalized): Secondary | ICD-10-CM | POA: Diagnosis not present

## 2023-09-15 DIAGNOSIS — M25661 Stiffness of right knee, not elsewhere classified: Secondary | ICD-10-CM | POA: Diagnosis not present

## 2023-09-15 DIAGNOSIS — R600 Localized edema: Secondary | ICD-10-CM

## 2023-09-15 DIAGNOSIS — R262 Difficulty in walking, not elsewhere classified: Secondary | ICD-10-CM

## 2023-09-15 NOTE — Therapy (Signed)
 OUTPATIENT PHYSICAL THERAPY TREATMENT   Patient Name: Ricardo Reese MRN: [id number] DOB:Mar 08, 1951, 73 y.o., male Today's Date: 09/15/2023  END OF SESSION:  PT End of Session - 09/15/23 0758     Visit Number 12    Number of Visits 30    Date for PT Re-Evaluation 10/27/23    Authorization Type MCR and BCBS    Progress Note Due on Visit 20    PT Start Time 0800    PT Stop Time 0841    PT Time Calculation (min) 41 min    Activity Tolerance Patient tolerated treatment well    Behavior During Therapy [facility] for tasks assessed/performed                Past Medical History:  Diagnosis Date   Diabetes mellitus without complication (HCC)    History of kidney stones    has had twice   Hyperlipidemia    Hypertension    Impaired fasting glucose    Kidney stone 1983   Melanoma of back [facility]) 2013   Dr Adolphus Birchwood   Pneumonia    walking back in 2002   Prostate cancer (HCC) 2019   Skin cancer 06/14/2011   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   Past Surgical History:  Procedure Laterality Date   COLONOSCOPY     KNEE SURGERY Right 1969   cartilage removal right knee    melanoma back  09/2010   PROSTATE BIOPSY     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   Procedure: LEFT TOTAL HIP ARTHROPLASTY ANTERIOR APPROACH;  Surgeon: Kathryne Hitch, MD;  Location: [facility] OR;  Service: Orthopedics;  Laterality: Left;   TOTAL KNEE ARTHROPLASTY Right 07/25/2023   Procedure: RIGHT TOTAL KNEE ARTHROPLASTY;  Surgeon: Kathryne Hitch, MD;  Location: [facility] OR;  Service: Orthopedics;  Laterality: Right;   Patient Active Problem List   Diagnosis Date Noted   OA (osteoarthritis) of knee 07/25/2023   Status post total right knee replacement 07/25/2023   Unilateral primary osteoarthritis, right knee 05/29/2023   Cognitive changes 01/24/2023   Left foot drop 12/31/2021   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   Right knee pain 10/25/2019   History of prostate  cancer 01/02/2018   Advance directive discussed with patient 09/26/2016   Status post left hip replacement 09/20/2016   Routine general medical examination at a health care facility 08/17/2011   History of melanoma    Hyperlipemia 05/20/2008   Benign essential hypertension 05/20/2008   Type 2 diabetes mellitus with other circulatory complications (HCC) 05/20/2008    PCP: Karie Schwalbe, MD   REFERRING PROVIDER: Kirtland Bouchard, PA-C   REFERRING DIAG: [phone] (ICD-10-CM) - Status post total right knee replacement   THERAPY DIAG:  Acute pain of right knee  Muscle weakness (generalized)  Difficulty in walking, not elsewhere classified  Stiffness of right knee, not elsewhere classified  Localized edema  Other abnormalities of gait and mobility  Rationale for Evaluation and Treatment: Rehabilitation  ONSET DATE: status post right total knee arthroplasty 07/25/2023   SUBJECTIVE:   SUBJECTIVE STATEMENT: "No pain" today  PERTINENT HISTORY: Post op  PAIN:  NPRS scale:  no pain at rest Pain location: Rt anteriror  Pain description: constant, achy, can be sharp Aggravating factors: walking, shower, sleeping on back Relieving factors: rest, meds  PRECAUTIONS: None  RED FLAGS: None   WEIGHT BEARING RESTRICTIONS: No  FALLS:  Has patient fallen in last 6 months? No  LIVING ENVIRONMENT: Has 15 stairs at home, reports he can do these already without much difficulty  OCCUPATION: retired but works part time at golf [employer]  PLOF: Independent  PATIENT GOALS: reduce pain, get back to normal and get back to golf  NEXT MD VISIT: 09/04/23  OBJECTIVE:  Note: Objective measures were completed at Evaluation unless otherwise noted.  PATIENT SURVEYS:  Eval Patient specific functional scale (0 unable to 10 no difficulty) 1)showering 2/10 2)walking without walker 1/10 Total 3/20, predicted goal >18/20   COGNITION: Overall cognitive status: Within functional limits  for tasks assessed    EDEMA:  Moderate edema Right knee  LOWER EXTREMITY ROM:  AROM/ Passive ROM Right eval Right 08/16/23 Rt 08/22/23 Right 08/24/23 Right 09/10/2023 Rt 09/12/23 Right 09/14/2023  Knee flexion 75/80 80/84 88/90  /104 AAROM 97 Passive 96 Supine AROM heel slide 106  Knee extension 10/8  10/ 6  Lacking 6 degrees Passive -5    (Blank rows = not tested)  LOWER EXTREMITY MMT:  MMT in sitting Right eval Left eval  Hip flexion 4   Hip extension    Hip abduction 4   Hip adduction    Hip internal rotation    Hip external rotation    Knee flexion 4   Knee extension 4   Ankle dorsiflexion    Ankle plantarflexion    Ankle inversion    Ankle eversion     (Blank rows = not tested)  FUNCTIONAL TESTS:  Eval: Sit to stand: unable to perform without UE support  GAIT: 09/14/2023:  Independent ambulation  Eval:  Distance walked: 100 Assistive device utilized: Environmental consultant - 2 wheeled Level of assistance: Modified independence Comments: good stability with this but poor without RW, and does not use AD PLOF                 TODAY'S TREATMENT:      DATE:09/15/2023   Therex: UBE LE only for ROM lvl 3.0 10 mins Seat 10   TherActivity (to improve stairs, transfers) Leg press double leg 125 lbs 3x10,  single leg Rt 50 lbs 3x10 RLE on 4" step with Lt heel tap 2x10; UE support needed Squats with 10# KB 2x10    TODAY'S TREATMENT:      DATE:09/14/2023   Therex: UBE LE only for ROM lvl 3.0 10 mins Seat 13, then seat 12 after 2 mins starting.  Supine Rt knee LAQ in 90 deg hip flexion 2 x 10  Supine heel slide AROM x 10  Supine heel prop 3 mins for extension with cues for home use.   TherActivity (to improve stairs, transfers) Leg press double leg 100 lbs x20,  single leg Rt 50 lbs 2 x 15 Flight of stairs down with bilateral UE assist, reciprocal gait pattern with SBA   Manual Rt knee seated flexion c distraction IR/mobilizations.    TODAY'S TREATMENT:      DATE: 09/12/23:   TherEx: Recumbent bike: x 9 minutes Leg extension: 10# Rt LE 2 x 10  Leg Curl machine: 20# Rt LE 2 x 10 TherActivites:  Leg press: single leg 75# 3 x 10 Leg Press: calf raises 37# bil LE 2 x 10  TRX: 2 x 15 holding end range 5 sec Step up on 8 inch step x 20 leading with Rt LE   TODAY'S TREATMENT:      DATE3/27/2025 Therapeutic Exercises: Nu step L6 X 10 min Seated knee flexion stretch 5 sec X 10   Therapeutic Activities: Leg  press BLEs 87# 10 reps 2 sets with knee flexion stretch 5 sec, then Rt leg only 50# X15 Step ups forward, 4 inch step up with Rt leg and down in front with left leg X 10 with one UE support, cues to move foot toward edge of step to reduce knee flexion needed.  Step ups lateral 4 inch inch step X 10 bilat with UE support Seated knee extension machine 5# Rt leg only 2X10, then 10# X 15 double leg Seated hamstring curl machine DL 16# 1W96 Golf chip shots X 10, small 25% swings in clinic with foam ball and pitching wedge, able to do this without pain reported .    TODAY'S TREATMENT:      DATE3/25/2025 Therapeutic Exercises: Nu step L6 X 10 min Seated knee flexion stretch 5 sec X 10     Therapeutic Activities: Pt amb without AD throughout session Leg press BLEs 87# 10 reps 2 sets with knee flexion stretch 5 sec, then Rt leg only 50# X15 Step ups forward, 4 inch step up with Rt leg and down in front with left leg X 10 with one UE support, cues to move foot toward edge of step to reduce knee flexion needed.  Step ups lateral 4 inch inch step X 10 bilat with UE support Seated knee extension machine 5# Rt leg only 2X10, then 10# X 15 double leg Seated hamstring curl machine DL 04# [id number]   PATIENT EDUCATION: Education details: HEP, PT plan of care Person educated: Patient Education method: Explanation, Demonstration, Verbal cues, and Handouts Education comprehension: verbalized understanding and needs further education   HOME EXERCISE PROGRAM: Access Code:  [email or web address] URL: [email or web address] Date: 08/31/2023 Prepared by: Pauletta Browns  Exercises - Supine Heel Slide with Strap  - 2 x daily - 6 x weekly - 1 sets - 10 reps - 5 hold - Seated Long Arc Quad  - 2 x daily - 6 x weekly - 2 sets - 10 reps - 3 sec hold - Seated Knee Flexion Stretch  - 2 x daily - 6 x weekly - 1 sets - 10 reps - 5 sec hold - Seated Straight Leg Raise with Quad Contraction  - 2 x daily - 6 x weekly - 2 sets - 10 reps - Seated Hamstring Stretch  - 2 x daily - 6 x weekly - 1 sets - 3 reps - 30 hold - Sit to Stand  - 2 x daily - 6 x weekly - 1-2 sets - 10 reps - Tandem Stance  - 1 x daily - 7 x weekly - 1 sets - 5 reps - 20 second hold - Supine Quadricep Sets  - 5 x daily - 7 x weekly - 2 sets - 10 reps - 5 second hold  ASSESSMENT:  CLINICAL IMPRESSION: Pt tolerated session well today; still with difficulty with eccentric quad control so did focus on that today.  Anticipate we are nearing d/c.  OBJECTIVE IMPAIRMENTS: decreased activity tolerance, difficulty walking, decreased balance, decreased endurance, decreased mobility, decreased ROM, decreased strength, impaired flexibility, impaired LE use, and pain.  ACTIVITY LIMITATIONS: bending, lifting, carry, locomotion, cleaning, community activity, driving, and or occupation  PERSONAL FACTORS: see PMH, are also affecting patient's functional outcome.  REHAB POTENTIAL: Good  CLINICAL DECISION MAKING: Stable/uncomplicated  EVALUATION COMPLEXITY: Low    GOALS: Short term PT Goals Target date: 09/05/2023   Pt will be I and compliant with HEP. Baseline:  Goal status: Met 08/31/2023 Pt will decrease pain to  less than 4/10 on average Baseline: Goal status:  MET 09/12/23  Long term PT goals Target date: 10/27/2023   Pt will improve Rt knee AROM 3-110 deg  to improve functional mobility Baseline: Goal status: Ongoing 09/12/23 Pt will improve  hip/knee strength to at least 5-/5 MMT to improve functional  strength Baseline: Goal status: Ongoing 09/12/23 Pt will improve PSFS to at least 18/20 to show improved function Baseline: Goal status: Ongoing 09/12/23 Pt will reduce pain to overall less than 2-3/10 with usual activity, work activity and return to golf Baseline: Goal status:  Ongoing 09/12/23 Pt will be able to ambulate community distances at least 1000 ft WNL gait pattern without complaints Baseline: Goal status: Ongoing 09/12/23  PLAN: PT FREQUENCY: 2-5 times per week beginning with 5 x/ week following manipulation and tapering down as appropriate.   PT DURATION: 6 weeks  PLANNED INTERVENTIONS (unless contraindicated): aquatic PT, Canalith repositioning, cryotherapy, Electrical stimulation, Iontophoresis with 4 mg/ml dexamethasome, Moist heat, traction, Ultrasound, gait training, Therapeutic exercise, balance training, neuromuscular re-education, patient/family education, manual techniques, passive ROM, dry needling, taping, vasopnuematic device, vestibular, spinal manipulations, joint manipulations 97110-Therapeutic exercises, 97530- Therapeutic activity, 97112- Neuromuscular re-education, 97535- Self Care, and 40981- Manual therapy  PLAN FOR NEXT SESSION:  Continue eccentric quad strengthening, ROM focus   Re-certification sent for new frequency on 09/12/23 following manipulation   Clarita Crane, PT, DPT 09/15/23 8:46 AM

## 2023-09-18 ENCOUNTER — Encounter: Payer: Self-pay | Admitting: Physical Therapy

## 2023-09-18 ENCOUNTER — Ambulatory Visit (INDEPENDENT_AMBULATORY_CARE_PROVIDER_SITE_OTHER): Admitting: Physical Therapy

## 2023-09-18 DIAGNOSIS — R262 Difficulty in walking, not elsewhere classified: Secondary | ICD-10-CM | POA: Diagnosis not present

## 2023-09-18 DIAGNOSIS — M25661 Stiffness of right knee, not elsewhere classified: Secondary | ICD-10-CM | POA: Diagnosis not present

## 2023-09-18 DIAGNOSIS — M25561 Pain in right knee: Secondary | ICD-10-CM | POA: Diagnosis not present

## 2023-09-18 DIAGNOSIS — M6281 Muscle weakness (generalized): Secondary | ICD-10-CM | POA: Diagnosis not present

## 2023-09-18 DIAGNOSIS — R600 Localized edema: Secondary | ICD-10-CM | POA: Diagnosis not present

## 2023-09-18 NOTE — Therapy (Signed)
 OUTPATIENT PHYSICAL THERAPY TREATMENT/Discharge PHYSICAL THERAPY DISCHARGE SUMMARY  Visits from Start of Care: 13  Current functional level related to goals / functional outcomes: See below   Remaining deficits: See below   Education / Equipment: HEP  Plan:  Patient goals were met. Patient is being discharged due to reaching good functional level and he feels ready to discharge today.      Patient Name: Ricardo Reese MRN: [id number] DOB:06/18/1950, 73 y.o., male Today's Date: 09/18/2023  END OF SESSION:  PT End of Session - 09/18/23 1425     Visit Number 13    Number of Visits 30    Date for PT Re-Evaluation 10/27/23    Authorization Type MCR and BCBS    Progress Note Due on Visit 20    PT Start Time 1428    PT Stop Time 1510    PT Time Calculation (min) 42 min    Activity Tolerance Patient tolerated treatment well    Behavior During Therapy WFL for tasks assessed/performed                Past Medical History:  Diagnosis Date   Diabetes mellitus without complication (HCC)    History of kidney stones    has had twice   Hyperlipidemia    Hypertension    Impaired fasting glucose    Kidney stone 1983   Melanoma of back [facility]) 2013   Dr Adolphus Birchwood   Pneumonia    walking back in 2002   Prostate cancer (HCC) 2019   Skin cancer 06/14/2011   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   Past Surgical History:  Procedure Laterality Date   COLONOSCOPY     KNEE SURGERY Right 1969   cartilage removal right knee    melanoma back  09/2010   PROSTATE BIOPSY     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   Procedure: LEFT TOTAL HIP ARTHROPLASTY ANTERIOR APPROACH;  Surgeon: Kathryne Hitch, MD;  Location: [facility] OR;  Service: Orthopedics;  Laterality: Left;   TOTAL KNEE ARTHROPLASTY Right 07/25/2023   Procedure: RIGHT TOTAL KNEE ARTHROPLASTY;  Surgeon: Kathryne Hitch, MD;  Location: [facility] OR;  Service: Orthopedics;  Laterality: Right;    Patient Active Problem List   Diagnosis Date Noted   OA (osteoarthritis) of knee 07/25/2023   Status post total right knee replacement 07/25/2023   Unilateral primary osteoarthritis, right knee 05/29/2023   Cognitive changes 01/24/2023   Left foot drop 12/31/2021   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   Right knee pain 10/25/2019   History of prostate cancer 01/02/2018   Advance directive discussed with patient 09/26/2016   Status post left hip replacement 09/20/2016   Routine general medical examination at a health care facility 08/17/2011   History of melanoma    Hyperlipemia 05/20/2008   Benign essential hypertension 05/20/2008   Type 2 diabetes mellitus with other circulatory complications (HCC) 05/20/2008    PCP: Karie Schwalbe, MD   REFERRING PROVIDER: Kirtland Bouchard, PA-C   REFERRING DIAG: [phone] (ICD-10-CM) - Status post total right knee replacement   THERAPY DIAG:  Acute pain of right knee  Muscle weakness (generalized)  Difficulty in walking, not elsewhere classified  Stiffness of right knee, not elsewhere classified  Localized edema  Rationale for Evaluation and Treatment: Rehabilitation  ONSET DATE: status post right total knee arthroplasty 07/25/2023   SUBJECTIVE:   SUBJECTIVE STATEMENT: "No pain" today, he feels ready to finish up with  PT today as he has now returned to work.  PERTINENT HISTORY: Post op  PAIN:  NPRS scale:  no pain at rest Pain location: Rt anteriror  Pain description: constant, achy, can be sharp Aggravating factors: walking, shower, sleeping on back Relieving factors: rest, meds  PRECAUTIONS: None  RED FLAGS: None   WEIGHT BEARING RESTRICTIONS: No  FALLS:  Has patient fallen in last 6 months? No  LIVING ENVIRONMENT: Has 15 stairs at home, reports he can do these already without much difficulty  OCCUPATION: retired but works part time at golf [employer]  PLOF: Independent  PATIENT GOALS: reduce pain, get back  to normal and get back to golf  NEXT MD VISIT: 09/04/23  OBJECTIVE:  Note: Objective measures were completed at Evaluation unless otherwise noted.  PATIENT SURVEYS:  Eval Patient specific functional scale (0 unable to 10 no difficulty) 1)showering 2/10 2)walking without walker 1/10 Total 3/20, predicted goal >18/20   COGNITION: Overall cognitive status: Within functional limits for tasks assessed    EDEMA:  Moderate edema Right knee  LOWER EXTREMITY ROM:  AROM/ Passive ROM Right eval Right 08/16/23 Rt 08/22/23 Right 08/24/23 Right 09/10/2023 Rt 09/12/23 Right 09/14/2023 Right 09/18/23  Knee flexion 75/80 80/84 88/90  /104 AAROM 97 Passive 96 Supine AROM heel slide 106 Supine AROM 106,  PROM 110  Knee extension 10/8  10/ 6  Lacking 6 degrees Passive -5  AROM -5   (Blank rows = not tested)  LOWER EXTREMITY MMT:  MMT in sitting Right eval Right 4/7/255  Hip flexion 4   Hip extension    Hip abduction 4   Hip adduction    Hip internal rotation    Hip external rotation    Knee flexion 4 5  Knee extension 4 5  Ankle dorsiflexion    Ankle plantarflexion    Ankle inversion    Ankle eversion     (Blank rows = not tested)  FUNCTIONAL TESTS:  Eval: Sit to stand: unable to perform without UE support  GAIT: 09/14/2023:  Independent ambulation  Eval:  Distance walked: 100 Assistive device utilized: Environmental consultant - 2 wheeled Level of assistance: Modified independence Comments: good stability with this but poor without RW, and does not use AD PLOF                 TODAY'S TREATMENT:      DATE:09/18/2023   Therex: UBE LE only for ROM lvl 3.0 10 mins Seat 10 Updated measurements and goals   TherActivity (to improve stairs, transfers) Leg press double leg 125 lbs 3x10,  single leg Rt 50 lbs 3x10 Seated knee extension machine 10# Rt leg only 2X12 Seated hamstring curls 35# DL [id number] Squats to chair with 10# KB 2x10      PATIENT EDUCATION: Education details: HEP, PT plan of  care Person educated: Patient Education method: Explanation, Demonstration, Verbal cues, and Handouts Education comprehension: verbalized understanding and needs further education   HOME EXERCISE PROGRAM: Access Code: [email or web address] URL: [email or web address] Date: 08/31/2023 Prepared by: Pauletta Browns  Exercises - Supine Heel Slide with Strap  - 2 x daily - 6 x weekly - 1 sets - 10 reps - 5 hold - Seated Long Arc Quad  - 2 x daily - 6 x weekly - 2 sets - 10 reps - 3 sec hold - Seated Knee Flexion Stretch  - 2 x daily - 6 x weekly - 1 sets - 10 reps - 5 sec hold - Seated Straight Leg Raise with Quad  Contraction  - 2 x daily - 6 x weekly - 2 sets - 10 reps - Seated Hamstring Stretch  - 2 x daily - 6 x weekly - 1 sets - 3 reps - 30 hold - Sit to Stand  - 2 x daily - 6 x weekly - 1-2 sets - 10 reps - Tandem Stance  - 1 x daily - 7 x weekly - 1 sets - 5 reps - 20 second hold - Supine Quadricep Sets  - 5 x daily - 7 x weekly - 2 sets - 10 reps - 5 second hold  ASSESSMENT:  CLINICAL IMPRESSION: He feels ready to discharge to independent program. He did well with PT after manipulation.   OBJECTIVE IMPAIRMENTS: decreased activity tolerance, difficulty walking, decreased balance, decreased endurance, decreased mobility, decreased ROM, decreased strength, impaired flexibility, impaired LE use, and pain.  ACTIVITY LIMITATIONS: bending, lifting, carry, locomotion, cleaning, community activity, driving, and or occupation  PERSONAL FACTORS: see PMH, are also affecting patient's functional outcome.  REHAB POTENTIAL: Good  CLINICAL DECISION MAKING: Stable/uncomplicated  EVALUATION COMPLEXITY: Low    GOALS: Short term PT Goals Target date: 09/05/2023   Pt will be I and compliant with HEP. Baseline:  Goal status: Met 08/31/2023 Pt will decrease pain to less than 4/10 on average Baseline: Goal status:  MET 09/12/23  Long term PT goals Target date: 10/27/2023   Pt will improve  Rt knee AROM 3-110 deg  to improve functional mobility Baseline: Goal status: mostly met 5-110 on 09/18/23 Pt will improve  hip/knee strength to at least 5-/5 MMT to improve functional strength Baseline: Goal status: met 09/18/23 Pt will improve PSFS to at least 18/20 to show improved function Baseline: Goal status: met 09/18/23 Pt will reduce pain to overall less than 2-3/10 with usual activity, work activity and return to golf Baseline: Goal status:  met 09/18/23 Pt will be able to ambulate community distances at least 1000 ft WNL gait pattern without complaints Baseline: Goal status: met 09/18/23  PLAN: PT FREQUENCY: 2-5 times per week beginning with 5 x/ week following manipulation and tapering down as appropriate.   PT DURATION: 6 weeks  PLANNED INTERVENTIONS (unless contraindicated): aquatic PT, Canalith repositioning, cryotherapy, Electrical stimulation, Iontophoresis with 4 mg/ml dexamethasome, Moist heat, traction, Ultrasound, gait training, Therapeutic exercise, balance training, neuromuscular re-education, patient/family education, manual techniques, passive ROM, dry needling, taping, vasopnuematic device, vestibular, spinal manipulations, joint manipulations 97110-Therapeutic exercises, 97530- Therapeutic activity, [id number]- Neuromuscular re-education, 97535- Self Care, and [id number]- Manual therapy  PLAN FOR NEXT SESSION:  DC today as he feels he is ready and he is at good functional level, he will continue with HEP  Ivery Quale, PT, DPT 09/18/23 3:19 PM

## 2023-09-19 ENCOUNTER — Encounter: Admitting: Physical Therapy

## 2023-09-20 ENCOUNTER — Encounter: Admitting: Physical Therapy

## 2023-09-25 ENCOUNTER — Encounter: Admitting: Rehabilitative and Restorative Service Providers"

## 2023-09-26 ENCOUNTER — Encounter: Admitting: Physical Therapy

## 2023-09-27 ENCOUNTER — Encounter: Admitting: Physical Therapy

## 2023-09-27 ENCOUNTER — Encounter: Payer: Self-pay | Admitting: Orthopaedic Surgery

## 2023-09-27 ENCOUNTER — Other Ambulatory Visit (INDEPENDENT_AMBULATORY_CARE_PROVIDER_SITE_OTHER)

## 2023-09-27 ENCOUNTER — Ambulatory Visit: Admitting: Orthopaedic Surgery

## 2023-09-27 DIAGNOSIS — Z96651 Presence of right artificial knee joint: Secondary | ICD-10-CM

## 2023-09-27 NOTE — Progress Notes (Signed)
 The patient is a 73 year old who is now 2 weeks status post a right knee manipulation under anesthesia of a total knee arthroplasty due to postoperative arthrofibrosis and decreased motion.  He has been diligently going to physical therapy.  He did play a few holes of golf last week.  Examination of his knee today still shows some swelling to be expected.  He lacks full extension by just a few degrees and I can flex him to about 105 degrees.  This is much improved and he is going to continue to do well I think.  2 views of the right knee today show well-seated total knee arthroplasty with no complicating features.  Will see him back in 3 months to see how he is doing overall.  There are no restrictions for him now.  He can golf and increase activities as comfort allows.  In 3 months no x-rays are needed unless there are issues.

## 2023-09-28 ENCOUNTER — Encounter: Admitting: Physical Therapy

## 2023-10-03 ENCOUNTER — Encounter: Admitting: Physical Therapy

## 2023-10-05 ENCOUNTER — Encounter: Admitting: Rehabilitative and Restorative Service Providers"

## 2023-10-06 ENCOUNTER — Encounter: Admitting: Rehabilitative and Restorative Service Providers"

## 2023-10-09 ENCOUNTER — Encounter: Admitting: Physical Therapy

## 2023-10-11 ENCOUNTER — Encounter: Admitting: Physical Therapy

## 2023-10-16 ENCOUNTER — Encounter: Admitting: Physical Therapy

## 2023-10-18 ENCOUNTER — Encounter: Admitting: Physical Therapy

## 2023-11-02 DIAGNOSIS — E119 Type 2 diabetes mellitus without complications: Secondary | ICD-10-CM | POA: Diagnosis not present

## 2023-11-13 ENCOUNTER — Ambulatory Visit: Admitting: Physician Assistant

## 2023-11-13 ENCOUNTER — Ambulatory Visit: Payer: Self-pay | Admitting: Physician Assistant

## 2023-11-13 ENCOUNTER — Ambulatory Visit (INDEPENDENT_AMBULATORY_CARE_PROVIDER_SITE_OTHER): Admitting: Internal Medicine

## 2023-11-13 ENCOUNTER — Encounter: Payer: Self-pay | Admitting: Internal Medicine

## 2023-11-13 ENCOUNTER — Ambulatory Visit: Payer: Self-pay | Admitting: Internal Medicine

## 2023-11-13 VITALS — BP 132/70 | HR 57 | Temp 98.1°F | Ht 69.25 in | Wt 192.0 lb

## 2023-11-13 DIAGNOSIS — E1159 Type 2 diabetes mellitus with other circulatory complications: Secondary | ICD-10-CM

## 2023-11-13 DIAGNOSIS — Z7984 Long term (current) use of oral hypoglycemic drugs: Secondary | ICD-10-CM

## 2023-11-13 DIAGNOSIS — M170 Bilateral primary osteoarthritis of knee: Secondary | ICD-10-CM | POA: Diagnosis not present

## 2023-11-13 DIAGNOSIS — I10 Essential (primary) hypertension: Secondary | ICD-10-CM | POA: Diagnosis not present

## 2023-11-13 LAB — POCT GLYCOSYLATED HEMOGLOBIN (HGB A1C): Hemoglobin A1C: 6.1 % — AB (ref 4.0–5.6)

## 2023-11-13 NOTE — Assessment & Plan Note (Signed)
 A1c down slightly to 6.1% Doing well just on metformin  500mg  bid

## 2023-11-13 NOTE — Assessment & Plan Note (Signed)
 Finally doing well after the right TKR

## 2023-11-13 NOTE — Progress Notes (Signed)
 Subjective:    Patient ID: Ricardo Reese, male    DOB: Jul 02, 1950, 73 y.o.   MRN: [id number]  HPI Here for follow up of diabetes and other chronic health conditions Did have right TKR in February Very painful at first--but now doing okay (needed manipulation under anaesthesia due to limitations of flexion)  Has lost some weight Able to be more active now  Hasn't been checking sugar lately  No chest pain  No SOB No dizziness or syncope  Current Outpatient Medications on File Prior to Visit  Medication Sig Dispense Refill   acetaminophen  (TYLENOL ) 325 MG tablet Take 1-2 tablets (325-650 mg total) by mouth every 6 (six) hours as needed for mild pain (pain score 1-3) (or temp > 100.5). 90 tablet 0   amLODipine  (NORVASC ) 5 MG tablet TAKE 1 TABLET BY MOUTH EVERY DAY (Patient taking differently: Take 5 mg by mouth every evening.) 90 tablet 3   ammonium lactate  (AMLACTIN) 12 % lotion Apply 1 application topically as needed for dry skin. 400 g 1   aspirin  81 MG chewable tablet Chew 1 tablet (81 mg total) by mouth 2 (two) times daily. 35 tablet 0   B Complex-C (SUPER B COMPLEX PO) Take 1 tablet by mouth in the morning.     fish oil-omega-3 fatty acids 1000 MG capsule Take 1 g by mouth in the morning.     metFORMIN  (GLUCOPHAGE ) 500 MG tablet Take 1 tablet (500 mg total) by mouth 2 (two) times daily with a meal. 180 tablet 3   Multiple Vitamin (MULTIVITAMIN WITH MINERALS) TABS tablet Take 1 tablet by mouth in the morning.     pravastatin  (PRAVACHOL ) 40 MG tablet TAKE 1 TABLET BY MOUTH EVERY DAY (Patient taking differently: Take 40 mg by mouth every evening.) 90 tablet 3   sildenafil  (REVATIO ) 20 MG tablet Take 3-5 tablets (60-100 mg total) by mouth daily as needed. 50 tablet 11   valsartan -hydrochlorothiazide  (DIOVAN -HCT) 320-25 MG tablet TAKE 1 TABLET BY MOUTH EVERY DAY 90 tablet 3   No current facility-administered medications on file prior to visit.    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    Past Surgical History:  Procedure Laterality Date   COLONOSCOPY     KNEE SURGERY Right 1969   cartilage removal right knee    melanoma back  09/2010   PROSTATE BIOPSY     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   Procedure: LEFT TOTAL HIP ARTHROPLASTY ANTERIOR APPROACH;  Surgeon: Arnie Lao, MD;  Location: [facility];  Service: Orthopedics;  Laterality: Left;   TOTAL KNEE ARTHROPLASTY Right 07/25/2023   Procedure: RIGHT TOTAL KNEE ARTHROPLASTY;  Surgeon: Arnie Lao, MD;  Location: [facility] OR;  Service: Orthopedics;  Laterality: Right;    Family History  Problem Relation Age of Onset   Heart attack Mother    Stroke Father    Lung cancer Father    Prostate cancer Brother    Heart disease Brother  Diabetes Neg Hx    Colon cancer Neg Hx    Stomach cancer Neg Hx    Esophageal cancer Neg Hx    Rectal cancer Neg Hx    Colon polyps Neg Hx     Social History   Socioeconomic History   Marital status: Married    Spouse name: [name]   Number of children: 2   Years of education: Not on file   Highest education level: Not on file  Occupational History   Occupation: Company secretary: [employer]    Comment: Retired  Tobacco Use   Smoking status: Never    Passive exposure: Past   Smokeless tobacco: Never  Vaping Use   Vaping status: Never Used  Substance and Sexual Activity   Alcohol use: Yes    Alcohol/week: 3.0 - 4.0 standard drinks of alcohol    Types: 3 - 4 Cans of beer per week    Comment: occasional beer   Drug use: No   Sexual activity:  Yes  Other Topics Concern   Not on file  Social History Narrative   Has living will   Wife is health care POA--- daughters next   Would accept resuscitation   No tube feeds if cognitively unaware   Lives with wife   retired      Right Handed    Lives in a two story home    Social Drivers of Health   Financial Resource Strain: Low Risk  (02/03/2021)   Overall Financial Resource Strain (CARDIA)    Difficulty of Paying Living Expenses: Not hard at all  Food Insecurity: Not on file  Transportation Needs: No Transportation Needs (07/18/2018)   PRAPARE - Administrator, Civil Service (Medical): No    Lack of Transportation (Non-Medical): No  Physical Activity: Not on file  Stress: Not on file  Social Connections: Not on file  Intimate Partner Violence: Not on file   Review of Systems Appetite is good Sleeps okay--first in chair then moves to bed Still works as Economist at [employer]     Objective:   Physical Exam Constitutional:      Appearance: Normal appearance.  Cardiovascular:     Rate and Rhythm: Normal rate and regular rhythm.     Heart sounds:     No gallop.     Comments: Soft systolic murmur Pulmonary:     Effort: Pulmonary effort is normal.     Breath sounds: Normal breath sounds. No wheezing or rales.  Musculoskeletal:     Cervical back: Neck supple.     Right lower leg: No edema.     Left lower leg: No edema.  Lymphadenopathy:     Cervical: No cervical adenopathy.  Skin:    Comments: No foot lesions  Neurological:     Mental Status: He is alert.            Assessment & Plan:

## 2023-11-13 NOTE — Addendum Note (Signed)
 Addended by: Franne Ivory on: 11/13/2023 02:16 PM   Modules accepted: Orders

## 2023-11-13 NOTE — Assessment & Plan Note (Signed)
 BP Readings from Last 3 Encounters:  11/13/23 132/70  07/26/23 (!) 142/77  06/23/23 (!) 148/57   Good control on the amlodipine  5mg  daily and valsartan /hydrochlorothiazide  320/25

## 2023-11-23 DIAGNOSIS — Z8546 Personal history of malignant neoplasm of prostate: Secondary | ICD-10-CM | POA: Diagnosis not present

## 2023-11-30 DIAGNOSIS — N5201 Erectile dysfunction due to arterial insufficiency: Secondary | ICD-10-CM | POA: Diagnosis not present

## 2023-11-30 DIAGNOSIS — Z8546 Personal history of malignant neoplasm of prostate: Secondary | ICD-10-CM | POA: Diagnosis not present

## 2023-12-27 ENCOUNTER — Ambulatory Visit (INDEPENDENT_AMBULATORY_CARE_PROVIDER_SITE_OTHER): Admitting: Orthopaedic Surgery

## 2023-12-27 ENCOUNTER — Encounter: Payer: Self-pay | Admitting: Orthopaedic Surgery

## 2023-12-27 DIAGNOSIS — Z96651 Presence of right artificial knee joint: Secondary | ICD-10-CM | POA: Diagnosis not present

## 2023-12-27 NOTE — Progress Notes (Signed)
 The patient is now 5 months status post a right total knee replacement.  We did have to perform manipulation under anesthesia and since then he has been able to get all of his motion back.  He says he has some pain and stiffness when he is going down hill and he does have some limping after playing golf but overall he is doing well.  On exam the knee feels lunacy stable and his range of motion is full.  There is just some slight warmth and slight swelling.  He will continue to increase his activities as comfort allows.  He knows that the swelling should hopefully subside as time goes by.  He can still work on Dance movement psychotherapist exercises.  We will see him back in 6 months with a standing AP and lateral of his right operative knee.  If there are issues before then he knows to reach out and let us  know.

## 2024-01-05 ENCOUNTER — Other Ambulatory Visit: Payer: Self-pay | Admitting: Internal Medicine

## 2024-01-16 ENCOUNTER — Ambulatory Visit: Admitting: Internal Medicine

## 2024-01-22 ENCOUNTER — Ambulatory Visit: Admitting: Internal Medicine

## 2024-01-25 ENCOUNTER — Ambulatory Visit (INDEPENDENT_AMBULATORY_CARE_PROVIDER_SITE_OTHER)

## 2024-01-25 VITALS — BP 132/70 | Ht 69.0 in | Wt 192.0 lb

## 2024-01-25 DIAGNOSIS — Z0000 Encounter for general adult medical examination without abnormal findings: Secondary | ICD-10-CM

## 2024-01-25 NOTE — Patient Instructions (Signed)
 Ricardo Reese , Thank you for taking time out of your busy schedule to complete your Annual Wellness Visit with me. I enjoyed our conversation and look forward to speaking with you again next year. I, as well as your care team,  appreciate your ongoing commitment to your health goals. Please review the following plan we discussed and let me know if I can assist you in the future. Your Game plan/ To Do List    Referrals: If you haven't heard from the office you've been referred to, please reach out to them at the phone provided.   Follow up Visits: We will see or speak with you next year for your Next Medicare AWV with our clinical staff Have you seen your provider in the last 6 months (3 months if uncontrolled diabetes)? Yes  Clinician Recommendations:  Aim for 30 minutes of exercise or brisk walking, 6-8 glasses of water, and 5 servings of fruits and vegetables each day.       This is a list of the screenings recommended for you:  Health Maintenance  Topic Date Due   Yearly kidney health urinalysis for diabetes  Never done   Hepatitis C Screening  Never done   Zoster (Shingles) Vaccine (1 of 2) 06/16/2001   COVID-19 Vaccine (6 - 2024-25 season) 02/12/2023   Eye exam for diabetics  09/26/2023   Flu Shot  01/12/2024   Complete foot exam   01/24/2024   Hemoglobin A1C  05/14/2024   Yearly kidney function blood test for diabetes  07/25/2024   Medicare Annual Wellness Visit  01/24/2025   DTaP/Tdap/Td vaccine (3 - Td or Tdap) 11/15/2031   Pneumococcal Vaccine for age over 63  Completed   HPV Vaccine  Aged Out   Meningitis B Vaccine  Aged Out   Colon Cancer Screening  Discontinued    Advanced directives: (In Chart) A copy of your advanced directives are scanned into your chart should your provider ever need it. Advance Care Planning is important because it:  [x]  Makes sure you receive the medical care that is consistent with your values, goals, and preferences  [x]  It provides guidance to  your family and loved ones and reduces their decisional burden about whether or not they are making the right decisions based on your wishes.  Follow the link provided in your after visit summary or read over the paperwork we have mailed to you to help you started getting your Advance Directives in place. If you need assistance in completing these, please reach out to us  so that we can help you!  See attachments for Preventive Care and Fall Prevention Tips.

## 2024-01-25 NOTE — Progress Notes (Signed)
 Because this visit was a virtual/telehealth visit,  certain criteria was not obtained, such a blood pressure, CBG if applicable, and timed get up and go. Any medications not marked as taking were not mentioned during the medication reconciliation part of the visit. Any vitals not documented were not able to be obtained due to this being a telehealth visit or patient was unable to self-report a recent blood pressure reading due to a lack of equipment at home via telehealth. Vitals that have been documented are verbally provided by the patient.  This visit was performed by a medical professional under my direct supervision. I was immediately available for consultation/collaboration. I have reviewed and agree with the Annual Wellness Visit documentation.  Subjective:   Ricardo Reese is a 73 y.o. who presents for a Medicare Wellness preventive visit.  As a reminder, Annual Wellness Visits don't include a physical exam, and some assessments may be limited, especially if this visit is performed virtually. We may recommend an in-person follow-up visit with your provider if needed.  Visit Complete: Virtual I connected with  Ricardo Reese on 01/25/24 by a audio enabled telemedicine application and verified that I am speaking with the correct person using two identifiers.  Patient Location: Home  Provider Location: Home Office  I discussed the limitations of evaluation and management by telemedicine. The patient expressed understanding and agreed to proceed.  Vital Signs: Because this visit was a virtual/telehealth visit, some criteria may be missing or patient reported. Any vitals not documented were not able to be obtained and vitals that have been documented are patient reported.  VideoDeclined- This patient declined Librarian, academic. Therefore the visit was completed with audio only.  Persons Participating in Visit: Patient.  AWV Questionnaire: No: Patient Medicare  AWV questionnaire was not completed prior to this visit.  Cardiac Risk Factors include: advanced age (>48men, >45 women);male gender;diabetes mellitus;dyslipidemia     Objective:    Today's Vitals   01/25/24 0912  BP: 132/70  Weight: 192 lb (87.1 kg)  Height: 5' 9 (1.753 m)   Body mass index is 28.35 kg/m.     01/25/2024    9:18 AM 08/08/2023    4:17 PM 07/25/2023    6:08 AM 06/23/2023    8:59 AM 08/16/2022    8:50 AM 05/23/2022   10:56 AM 05/23/2022   10:53 AM  Advanced Directives  Does Patient Have a Medical Advance Directive? Yes Yes Yes Yes Yes Yes Yes  Type of Estate agent of [place];Living will Healthcare Power of [place];Living will Healthcare Power of [place];Living will Living will Healthcare Power of [employer] Power of [place];Living will Living will;Healthcare Power of Attorney  Does patient want to make changes to medical advance directive? No - Patient declined   No - Patient declined     Copy of Healthcare Power of Attorney in Chart? Yes - validated most recent copy scanned in chart (See row information)   Yes - validated most recent copy scanned in chart (See row information)       Current Medications (verified) Outpatient Encounter Medications as of 01/25/2024  Medication Sig   acetaminophen (TYLENOL) 325 MG tablet Take 1-2 tablets (325-650 mg total) by mouth every 6 (six) hours as needed for mild pain (pain score 1-3) (or temp > 100.5).   amLODipine (NORVASC) 5 MG tablet TAKE 1 TABLET BY MOUTH EVERY DAY (Patient taking differently: Take 5 mg by mouth every evening.)   ammonium lactate (AMLACTIN) 12 %  lotion Apply 1 application topically as needed for dry skin.   aspirin 81 MG chewable tablet Chew 1 tablet (81 mg total) by mouth 2 (two) times daily.   B Complex-C (SUPER B COMPLEX PO) Take 1 tablet by mouth in the morning.   fish oil-omega-3 fatty acids 1000 MG capsule Take 1 g by mouth in the morning.   metFORMIN (GLUCOPHAGE) 500  MG tablet Take 1 tablet (500 mg total) by mouth 2 (two) times daily with a meal.   Multiple Vitamin (MULTIVITAMIN WITH MINERALS) TABS tablet Take 1 tablet by mouth in the morning.   pravastatin (PRAVACHOL) 40 MG tablet TAKE 1 TABLET BY MOUTH EVERY DAY   sildenafil (REVATIO) 20 MG tablet Take 3-5 tablets (60-100 mg total) by mouth daily as needed.   valsartan-hydrochlorothiazide (DIOVAN-HCT) 320-25 MG tablet TAKE 1 TABLET BY MOUTH EVERY DAY   No facility-administered encounter medications on file as of 01/25/2024.    Allergies (verified) Patient has no known allergies.   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   Past Surgical History:  Procedure Laterality Date   COLONOSCOPY     KNEE SURGERY Right 1969   cartilage removal right knee    melanoma back  09/2010   PROSTATE BIOPSY     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   Procedure: LEFT TOTAL HIP ARTHROPLASTY ANTERIOR APPROACH;  Surgeon: Lonni CINDERELLA Poli, MD;  Location: [facility] OR;  Service: Orthopedics;  Laterality: Left;   TOTAL KNEE ARTHROPLASTY Right 07/25/2023   Procedure: RIGHT TOTAL KNEE ARTHROPLASTY;  Surgeon: Poli Lonni CINDERELLA, MD;  Location: [facility] OR;  Service: Orthopedics;  Laterality: Right;   Family History  Problem Relation Age of Onset   Heart attack Mother    Stroke Father    Lung cancer Father    Prostate cancer Brother    Heart disease Brother    Diabetes Neg Hx    Colon cancer Neg Hx    Stomach cancer Neg Hx    Esophageal cancer Neg Hx    Rectal cancer Neg Hx    Colon polyps Neg Hx    Social History    Socioeconomic History   Marital status: Married    Spouse name: [name]   Number of children: 2   Years of education: Not on file   Highest education level: Not on file  Occupational History   Occupation: Company secretary: [employer]    Comment: Retired  Tobacco Use   Smoking status: Never    Passive exposure: Past   Smokeless tobacco: Never  Vaping Use   Vaping status: Never Used  Substance and Sexual Activity   Alcohol use: Yes    Alcohol/week: 3.0 - 4.0 standard drinks of alcohol    Types: 3 - 4 Cans of beer per week    Comment: occasional beer   Drug use: No   Sexual activity: Yes  Other Topics Concern   Not on file  Social History Narrative   Has living will  Wife is health care POA--- daughters next   Would accept resuscitation   No tube feeds if cognitively unaware   Lives with wife   retired      Right Handed    Lives in a two story home    Social Drivers of Health   Financial Resource Strain: Low Risk  (01/25/2024)   Overall Financial Resource Strain (CARDIA)    Difficulty of Paying Living Expenses: Not hard at all  Food Insecurity: No Food Insecurity (01/25/2024)   Hunger Vital Sign    Worried About Running Out of Food in the Last Year: Never true    Ran Out of Food in the Last Year: Never true  Transportation Needs: No Transportation Needs (01/25/2024)   PRAPARE - Administrator, Civil Service (Medical): No    Lack of Transportation (Non-Medical): No  Physical Activity: Insufficiently Active (01/25/2024)   Exercise Vital Sign    Days of Exercise per Week: 3 days    Minutes of Exercise per Session: 30 min  Stress: No Stress Concern Present (01/25/2024)   [employer] of Occupational Health - Occupational Stress Questionnaire    Feeling of Stress: Not at all  Social Connections: Moderately Integrated (01/25/2024)   Social Connection and Isolation Panel    Frequency of Communication with Friends and Family: More than three times a  week    Frequency of Social Gatherings with Friends and Family: More than three times a week    Attends Religious Services: Never    Database administrator or Organizations: Yes    Attends Engineer, structural: 1 to 4 times per year    Marital Status: Married    Tobacco Counseling Counseling given: Not Answered    Clinical Intake:  Pre-visit preparation completed: Yes  Pain : No/denies pain     BMI - recorded: 28.35 Nutritional Status: BMI > 30  Obese Nutritional Risks: None Diabetes: Yes CBG done?: No Did pt. bring in CBG monitor from home?: No  Lab Results  Component Value Date   HGBA1C 6.1 (A) 11/13/2023   HGBA1C 6.4 (H) 06/23/2023   HGBA1C 6.2 01/24/2023     How often do you need to have someone help you when you read instructions, pamphlets, or other written materials from your doctor or pharmacy?: 1 - Never What is the last grade level you completed in school?: 2 years of college  Interpreter Needed?: No  Information entered by :: [employer]   Activities of Daily Living     01/25/2024    9:17 AM 07/25/2023    6:07 AM  In your present state of health, do you have any difficulty performing the following activities:  Hearing? 0 0  Vision? 0 0  Difficulty concentrating or making decisions? 0 0  Walking or climbing stairs? 0   Dressing or bathing? 0   Doing errands, shopping? 0   Preparing Food and eating ? N   Using the Toilet? N   In the past six months, have you accidently leaked urine? N   Do you have problems with loss of bowel control? N   Managing your Medications? N   Managing your Finances? N   Housekeeping or managing your Housekeeping? N     Patient Care Team: Jimmy Charlie FERNS, MD as PCP - General Foltanski, Morna SAILOR, [place] (Inactive) as Pharmacist (Pharmacist) Patel, Donika K, DO as Consulting Physician (Neurology)  I have updated your Care Teams any recent Medical Services you may  have received from other providers in  the past year.     Assessment:   This is a routine wellness examination for [facility].  Hearing/Vision screen Hearing Screening - Comments:: No difficulties  Vision Screening - Comments:: Patient wear glasses    Goals Addressed             This Visit's Progress    Patient Stated       Patient want to maintain the weight       Depression Screen     01/25/2024    9:19 AM 11/13/2023   11:30 AM 01/24/2023    8:17 AM 01/24/2023    7:51 AM 12/31/2021    8:30 AM 12/23/2020    9:10 AM 10/25/2019    7:57 AM  PHQ 2/9 Scores  PHQ - 2 Score 0 0 0 0 0 0 0  PHQ- 9 Score 0          Fall Risk     01/25/2024    9:18 AM 11/13/2023   11:30 AM 01/24/2023    8:17 AM 01/24/2023    7:51 AM 05/23/2022   10:56 AM  Fall Risk   Falls in the past year? 0 0 0 0 0  Number falls in past yr: 0 0  0 0  Injury with Fall? 0 0  0 0  Risk for fall due to : No Fall Risks No Fall Risks  No Fall Risks   Follow up Falls evaluation completed Falls evaluation completed  Falls evaluation completed Falls evaluation completed      Data saved with a previous flowsheet row definition    MEDICARE RISK AT HOME:  Medicare Risk at Home Any stairs in or around the home?: Yes If so, are there any without handrails?: No Home free of loose throw rugs in walkways, pet beds, electrical cords, etc?: Yes Adequate lighting in your home to reduce risk of falls?: Yes Life alert?: No Use of a cane, walker or w/c?: No Grab bars in the bathroom?: No Shower chair or bench in shower?: No Elevated toilet seat or a handicapped toilet?: No  TIMED UP AND GO:  Was the test performed?  No  Cognitive Function: 6CIT completed        01/25/2024    9:16 AM  6CIT Screen  What Year? 0 points  What month? 0 points  What time? 0 points  Count back from 20 0 points  Months in reverse 0 points  Repeat phrase 0 points  Total Score 0 points    Immunizations Immunization History  Administered Date(s) Administered   Fluad  Quad(high Dose 65+) 04/03/2019, 03/19/2020, 05/25/2022   Influenza Split 02/13/2013   Influenza, Seasonal, Injecte, Preservative Fre 03/14/2015   Influenza,inj,Quad PF,6+ Mos 02/26/2018, 04/01/2018   Influenza-Unspecified 04/28/2021   PFIZER Comirnaty(Gray Top)Covid-19 Tri-Sucrose Vaccine 03/19/2021   PFIZER(Purple Top)SARS-COV-2 Vaccination 07/05/2019, 07/24/2019, 03/28/2020   Pfizer Covid-19 Vaccine Bivalent Booster 41yrs & up 03/19/2021   Pneumococcal Conjugate-13 10/04/2017   Pneumococcal Polysaccharide-23 10/29/2018   Td 05/20/2008   Tdap 11/14/2021   Zoster, Live 06/13/2012    Screening Tests Health Maintenance  Topic Date Due   Diabetic kidney evaluation - Urine ACR  Never done   Hepatitis C Screening  Never done   Zoster Vaccines- Shingrix (1 of 2) 06/16/2001   COVID-19 Vaccine (6 - 2024-25 season) 02/12/2023   OPHTHALMOLOGY EXAM  09/26/2023   INFLUENZA VACCINE  01/12/2024   FOOT EXAM  01/24/2024   HEMOGLOBIN A1C  05/14/2024  Diabetic kidney evaluation - eGFR measurement  07/25/2024   Medicare Annual Wellness (AWV)  01/24/2025   DTaP/Tdap/Td (3 - Td or Tdap) 11/15/2031   Pneumococcal Vaccine: 50+ Years  Completed   HPV VACCINES  Aged Out   Meningococcal B Vaccine  Aged Out   Colonoscopy  Discontinued    Health Maintenance  Health Maintenance Due  Topic Date Due   Diabetic kidney evaluation - Urine ACR  Never done   Hepatitis C Screening  Never done   Zoster Vaccines- Shingrix (1 of 2) 06/16/2001   COVID-19 Vaccine (6 - 2024-25 season) 02/12/2023   OPHTHALMOLOGY EXAM  09/26/2023   INFLUENZA VACCINE  01/12/2024   FOOT EXAM  01/24/2024   Health Maintenance Items Addressed:patient declined   Additional Screening:  Vision Screening: Recommended annual ophthalmology exams for early detection of glaucoma and other disorders of the eye. Would you like a referral to an eye doctor? No    Dental Screening: Recommended annual dental exams for proper oral  hygiene  Community Resource Referral / Chronic Care Management: CRR required this visit?  No   CCM required this visit?  No   Plan:    I have personally reviewed and noted the following in the patient's chart:   Medical and social history Use of alcohol, tobacco or illicit drugs  Current medications and supplements including opioid prescriptions. Patient is not currently taking opioid prescriptions. Functional ability and status Nutritional status Physical activity Advanced directives List of other physicians Hospitalizations, surgeries, and ER visits in previous 12 months Vitals Screenings to include cognitive, depression, and falls Referrals and appointments  In addition, I have reviewed and discussed with patient certain preventive protocols, quality metrics, and best practice recommendations. A written personalized care plan for preventive services as well as general preventive health recommendations were provided to patient.   Lyle MARLA Right, [place]   01/25/2024   After Visit Summary: (MyChart) Due to this being a telephonic visit, the after visit summary with patients personalized plan was offered to patient via MyChart   Notes: Nothing significant to report at this time.

## 2024-02-03 ENCOUNTER — Other Ambulatory Visit: Payer: Self-pay | Admitting: Internal Medicine

## 2024-02-27 ENCOUNTER — Other Ambulatory Visit: Payer: Self-pay

## 2024-02-27 DIAGNOSIS — E1159 Type 2 diabetes mellitus with other circulatory complications: Secondary | ICD-10-CM

## 2024-02-27 DIAGNOSIS — D649 Anemia, unspecified: Secondary | ICD-10-CM

## 2024-02-27 DIAGNOSIS — E785 Hyperlipidemia, unspecified: Secondary | ICD-10-CM

## 2024-03-06 ENCOUNTER — Other Ambulatory Visit

## 2024-03-13 ENCOUNTER — Encounter

## 2024-03-13 DIAGNOSIS — D2261 Melanocytic nevi of right upper limb, including shoulder: Secondary | ICD-10-CM | POA: Diagnosis not present

## 2024-03-13 DIAGNOSIS — D2272 Melanocytic nevi of left lower limb, including hip: Secondary | ICD-10-CM | POA: Diagnosis not present

## 2024-03-13 DIAGNOSIS — Z85828 Personal history of other malignant neoplasm of skin: Secondary | ICD-10-CM | POA: Diagnosis not present

## 2024-03-13 DIAGNOSIS — L570 Actinic keratosis: Secondary | ICD-10-CM | POA: Diagnosis not present

## 2024-03-13 DIAGNOSIS — Z85820 Personal history of malignant melanoma of skin: Secondary | ICD-10-CM | POA: Diagnosis not present

## 2024-03-13 DIAGNOSIS — D225 Melanocytic nevi of trunk: Secondary | ICD-10-CM | POA: Diagnosis not present

## 2024-03-13 DIAGNOSIS — D2262 Melanocytic nevi of left upper limb, including shoulder: Secondary | ICD-10-CM | POA: Diagnosis not present

## 2024-03-26 ENCOUNTER — Ambulatory Visit (INDEPENDENT_AMBULATORY_CARE_PROVIDER_SITE_OTHER)

## 2024-03-26 DIAGNOSIS — Z23 Encounter for immunization: Secondary | ICD-10-CM | POA: Diagnosis not present

## 2024-04-01 ENCOUNTER — Other Ambulatory Visit

## 2024-04-01 DIAGNOSIS — D649 Anemia, unspecified: Secondary | ICD-10-CM

## 2024-04-01 DIAGNOSIS — E1159 Type 2 diabetes mellitus with other circulatory complications: Secondary | ICD-10-CM

## 2024-04-01 DIAGNOSIS — E785 Hyperlipidemia, unspecified: Secondary | ICD-10-CM | POA: Diagnosis not present

## 2024-04-01 LAB — CBC WITH DIFFERENTIAL/PLATELET
Basophils Absolute: 0 K/uL (ref 0.0–0.1)
Basophils Relative: 0.6 % (ref 0.0–3.0)
Eosinophils Absolute: 0.1 K/uL (ref 0.0–0.7)
Eosinophils Relative: 3.5 % (ref 0.0–5.0)
HCT: 40.6 % (ref 39.0–52.0)
Hemoglobin: 13.8 g/dL (ref 13.0–17.0)
Lymphocytes Relative: 18.8 % (ref 12.0–46.0)
Lymphs Abs: 0.8 K/uL (ref 0.7–4.0)
MCHC: 33.9 g/dL (ref 30.0–36.0)
MCV: 87.5 fl (ref 78.0–100.0)
Monocytes Absolute: 0.6 K/uL (ref 0.1–1.0)
Monocytes Relative: 14.1 % — ABNORMAL HIGH (ref 3.0–12.0)
Neutro Abs: 2.6 K/uL (ref 1.4–7.7)
Neutrophils Relative %: 63 % (ref 43.0–77.0)
Platelets: 267 K/uL (ref 150.0–400.0)
RBC: 4.64 Mil/uL (ref 4.22–5.81)
RDW: 14.4 % (ref 11.5–15.5)
WBC: 4.2 K/uL (ref 4.0–10.5)

## 2024-04-01 LAB — LIPID PANEL
Cholesterol: 226 mg/dL — ABNORMAL HIGH (ref 0–200)
HDL: 59.9 mg/dL (ref >39.00)
LDL Cholesterol: 141 mg/dL — ABNORMAL HIGH (ref 0–99)
NonHDL: 166.51
Total CHOL/HDL Ratio: 4
Triglycerides: 130 mg/dL (ref 0.0–149.0)
VLDL: 26 mg/dL (ref 0.0–40.0)

## 2024-04-01 LAB — HEMOGLOBIN A1C: Hgb A1c MFr Bld: 6.1 % (ref 4.6–6.5)

## 2024-04-03 ENCOUNTER — Ambulatory Visit: Payer: Self-pay

## 2024-04-05 ENCOUNTER — Ambulatory Visit (INDEPENDENT_AMBULATORY_CARE_PROVIDER_SITE_OTHER)

## 2024-04-05 VITALS — BP 124/70 | HR 80 | Temp 98.0°F | Ht 69.5 in | Wt 196.0 lb

## 2024-04-05 DIAGNOSIS — Z7984 Long term (current) use of oral hypoglycemic drugs: Secondary | ICD-10-CM | POA: Diagnosis not present

## 2024-04-05 DIAGNOSIS — E785 Hyperlipidemia, unspecified: Secondary | ICD-10-CM | POA: Diagnosis not present

## 2024-04-05 DIAGNOSIS — R4189 Other symptoms and signs involving cognitive functions and awareness: Secondary | ICD-10-CM

## 2024-04-05 DIAGNOSIS — E1159 Type 2 diabetes mellitus with other circulatory complications: Secondary | ICD-10-CM

## 2024-04-05 MED ORDER — ATORVASTATIN CALCIUM 40 MG PO TABS: 40.0000 mg | ORAL_TABLET | Freq: Every evening | ORAL | 1 refills | Status: AC

## 2024-04-05 NOTE — Patient Instructions (Addendum)
 Thank you for visiting [facility] today! Here's what we talked about: - Start Atorvastatin, stop Pravastatin  -You will be called for the MRI in coming weeks

## 2024-04-05 NOTE — Progress Notes (Signed)
 Subjective:   This visit was conducted in person. The patient gave informed consent to the use of Abridge AI technology to record the contents of the encounter as documented below.   Patient ID: Ricardo Reese, male    DOB: 12/15/1950, 73 y.o.   MRN: [id number]   Discussed the use of AI scribe software for clinical note transcription with the patient, who gave verbal consent to proceed.  History of Present Illness Ricardo Reese is a 73 year old male with diabetes and hyperlipidemia who presents with memory concerns and elevated cholesterol.  He has been experiencing memory concerns for about four years, characterized by episodes of confusion in familiar environments, such as difficulty finding the bathroom in his daughter's house and confusion during a recent golf trip. He also faces challenges with spatial awareness, particularly in new settings. Despite these issues, he maintains his ability to engage in conversations, manage finances, and perform daily activities independently. No significant memory issues in familiar environments or personality changes.  He has a history of diabetes, managed with metformin  500 mg twice daily. He experiences increased thirst but does not regularly monitor his blood sugar at home. His recent A1c was 6.1, indicating well-controlled diabetes.  He has a history of hyperlipidemia and was previously on pravastatin  for over 20 years, which he discontinued due to concerns about memory issues and potential side effects. He is currently on pravastatin  for cholesterol management, but the provider is considering switching to a higher intensity atorvastatin (40 mg) due to his elevated cholesterol risk.    Review of Systems  All other systems reviewed and are negative.       No Known Allergies  Current Outpatient Medications on File Prior to Visit  Medication Sig Dispense Refill   acetaminophen  (TYLENOL ) 325 MG tablet Take 1-2 tablets (325-650 mg total) by mouth  every 6 (six) hours as needed for mild pain (pain score 1-3) (or temp > 100.5). 90 tablet 0   amLODipine  (NORVASC ) 5 MG tablet TAKE 1 TABLET BY MOUTH EVERY DAY 90 tablet 1   ammonium lactate  (AMLACTIN) 12 % lotion Apply 1 application topically as needed for dry skin. 400 g 1   B Complex-C (SUPER B COMPLEX PO) Take 1 tablet by mouth in the morning.     fish oil-omega-3 fatty acids 1000 MG capsule Take 1 g by mouth in the morning.     Magnesium 400 MG TABS      metFORMIN  (GLUCOPHAGE ) 500 MG tablet Take 1 tablet (500 mg total) by mouth 2 (two) times daily with a meal. 180 tablet 3   Multiple Vitamin (MULTIVITAMIN WITH MINERALS) TABS tablet Take 1 tablet by mouth in the morning.     sildenafil  (REVATIO ) 20 MG tablet Take 3-5 tablets (60-100 mg total) by mouth daily as needed. 50 tablet 11   valsartan -hydrochlorothiazide  (DIOVAN -HCT) 320-25 MG tablet TAKE 1 TABLET BY MOUTH EVERY DAY 90 tablet 3   No current facility-administered medications on file prior to visit.    BP 124/70 (BP Location: Left Arm, Patient Position: Sitting, Cuff Size: Large)   Pulse 80   Temp 98 F (36.7 C) (Oral)   Ht 5' 9.5 (1.765 m)   Wt 196 lb (88.9 kg)   SpO2 99%   BMI 28.53 kg/m   Objective:      Physical Exam GENERAL: Alert, cooperative, well developed, no acute distress. HEAD: Normocephalic atraumatic. EYES: Extraocular movements intact BL, conjunctivae normal BL. NEUROLOGICAL: Oriented to person, place and time,  no gait abnormalities, moves all extremities without gross motor or sensory deficit.         Assessment & Plan:   Assessment & Plan Cognitive impairment Memory impairment with spatial awareness issues, reportedly, no other aspects of his cognition are impaired, unclear if he is truly meeting criteria for dementia diagnosis at this time.  Unlikely related to statin use given patient's advanced age.  Will order lab workup as below to rule out reversible causes, brain imaging to rule out  structural causes, with plan to do MoCA at next visit. If found to have mild cognitive impairment, would weigh out with the patient whether it is truly necessary to treat this given intact ADLs and IADLs.   - Order MRI brain. - Order labs for reversible causes. - Perform MOCA at next visit. - Determine next steps based on results.  Hyperlipidemia  Hyperlipidemia with 38.3% ten-year cardiovascular risk. Discontinued pravastatin  due to inappropriate intensity. Recommended atorvastatin for better risk management.  - Discontinue pravastatin . - Prescribe atorvastatin 40 mg nightly. - Monitor for muscle aches.  Type 2 diabetes mellitus with other circulatory complication Type 2 diabetes well-controlled with A1c at 6.1. No medication adjustment needed.  - Continue metformin  500 mg twice daily. - Maintain current diet. - Check A1c every six months.    Return in about 4 weeks (around 05/03/2024) for CPE, schedule fasting lab visit 1 week from now.   Kern Gingras K Kensi Karr, MD  04/05/24     Contains text generated by Abridge.

## 2024-04-11 ENCOUNTER — Ambulatory Visit
Admission: RE | Admit: 2024-04-11 | Discharge: 2024-04-11 | Disposition: A | Discharge status: Home / Self Care | Source: Ambulatory Visit

## 2024-04-11 DIAGNOSIS — R4189 Other symptoms and signs involving cognitive functions and awareness: Secondary | ICD-10-CM | POA: Insufficient documentation

## 2024-04-11 DIAGNOSIS — G319 Degenerative disease of nervous system, unspecified: Secondary | ICD-10-CM | POA: Diagnosis not present

## 2024-04-11 DIAGNOSIS — R413 Other amnesia: Secondary | ICD-10-CM | POA: Diagnosis not present

## 2024-04-11 DIAGNOSIS — J341 Cyst and mucocele of nose and nasal sinus: Secondary | ICD-10-CM | POA: Diagnosis not present

## 2024-04-12 ENCOUNTER — Other Ambulatory Visit (INDEPENDENT_AMBULATORY_CARE_PROVIDER_SITE_OTHER)

## 2024-04-12 DIAGNOSIS — R419 Unspecified symptoms and signs involving cognitive functions and awareness: Secondary | ICD-10-CM | POA: Diagnosis not present

## 2024-04-12 DIAGNOSIS — R4189 Other symptoms and signs involving cognitive functions and awareness: Secondary | ICD-10-CM | POA: Diagnosis not present

## 2024-04-12 LAB — COMPREHENSIVE METABOLIC PANEL WITH GFR
ALT: 12 U/L (ref 0–53)
AST: 14 U/L (ref 0–37)
Albumin: 4.5 g/dL (ref 3.5–5.2)
Alkaline Phosphatase: 67 U/L (ref 39–117)
BUN: 21 mg/dL (ref 6–23)
CO2: 29 meq/L (ref 19–32)
Calcium: 9.6 mg/dL (ref 8.4–10.5)
Chloride: 102 meq/L (ref 96–112)
Creatinine, Ser: 0.98 mg/dL (ref 0.40–1.50)
GFR: 76.87 mL/min (ref >60.00)
Glucose, Bld: 100 mg/dL — ABNORMAL HIGH (ref 70–99)
Potassium: 5.1 meq/L (ref 3.5–5.1)
Sodium: 139 meq/L (ref 135–145)
Total Bilirubin: 0.5 mg/dL (ref 0.2–1.2)
Total Protein: 6.6 g/dL (ref 6.0–8.3)

## 2024-04-12 LAB — CBC WITH DIFFERENTIAL/PLATELET
Basophils Absolute: 0 K/uL (ref 0.0–0.1)
Basophils Relative: 0.9 % (ref 0.0–3.0)
Eosinophils Absolute: 0.1 K/uL (ref 0.0–0.7)
Eosinophils Relative: 2.7 % (ref 0.0–5.0)
HCT: 41.6 % (ref 39.0–52.0)
Hemoglobin: 14.1 g/dL (ref 13.0–17.0)
Lymphocytes Relative: 17.6 % (ref 12.0–46.0)
Lymphs Abs: 0.8 K/uL (ref 0.7–4.0)
MCHC: 33.9 g/dL (ref 30.0–36.0)
MCV: 87.6 fl (ref 78.0–100.0)
Monocytes Absolute: 0.6 K/uL (ref 0.1–1.0)
Monocytes Relative: 12.3 % — ABNORMAL HIGH (ref 3.0–12.0)
Neutro Abs: 3.1 K/uL (ref 1.4–7.7)
Neutrophils Relative %: 66.5 % (ref 43.0–77.0)
Platelets: 271 K/uL (ref 150.0–400.0)
RBC: 4.75 Mil/uL (ref 4.22–5.81)
RDW: 14.2 % (ref 11.5–15.5)
WBC: 4.7 K/uL (ref 4.0–10.5)

## 2024-04-12 LAB — B12 AND FOLATE PANEL
Folate: 21.6 ng/mL (ref >5.9)
Vitamin B-12: 472 pg/mL (ref 211–911)

## 2024-04-12 LAB — TSH: TSH: 1.24 u[IU]/mL (ref 0.35–5.50)

## 2024-04-13 ENCOUNTER — Ambulatory Visit: Payer: Self-pay

## 2024-04-15 ENCOUNTER — Encounter: Payer: Self-pay | Admitting: Radiology

## 2024-05-22 ENCOUNTER — Ambulatory Visit

## 2024-05-22 VITALS — BP 124/66 | HR 63 | Temp 97.8°F | Ht 69.0 in | Wt 201.0 lb

## 2024-05-22 DIAGNOSIS — R4189 Other symptoms and signs involving cognitive functions and awareness: Secondary | ICD-10-CM | POA: Diagnosis not present

## 2024-05-22 DIAGNOSIS — I358 Other nonrheumatic aortic valve disorders: Secondary | ICD-10-CM | POA: Diagnosis not present

## 2024-05-22 DIAGNOSIS — Z23 Encounter for immunization: Secondary | ICD-10-CM

## 2024-05-22 DIAGNOSIS — I10 Essential (primary) hypertension: Secondary | ICD-10-CM | POA: Diagnosis not present

## 2024-05-22 DIAGNOSIS — E785 Hyperlipidemia, unspecified: Secondary | ICD-10-CM | POA: Diagnosis not present

## 2024-05-22 DIAGNOSIS — Z0000 Encounter for general adult medical examination without abnormal findings: Secondary | ICD-10-CM

## 2024-05-22 MED ORDER — VALSARTAN 320 MG PO TABS: 320.0000 mg | ORAL_TABLET | Freq: Every day | ORAL | 3 refills | Status: AC

## 2024-05-22 MED ORDER — SILDENAFIL CITRATE 20 MG PO TABS: 100.0000 mg | ORAL_TABLET | Freq: Every day | ORAL | 11 refills | Status: AC | PRN

## 2024-05-22 NOTE — Progress Notes (Signed)
 Assessment & Plan:   This visit was conducted in person. The patient gave informed consent to the use of Abridge AI technology to record the contents of the encounter as documented below.  Assessment & Plan Adult Wellness Visit Routine visit with no significant issues. Discussed colonoscopy results, smoking history, and abdominal aortic aneurysm screening. Reviewed lab results and vaccination status. Discussed cognitive changes and age-related brain atrophy.  - Administered updated pneumonia vaccine in office. - Schedule follow-up for MOCA in four weeks.   I have personally reviewed and have noted: 1. The patient's medical and social history 2. Their use of alcohol, tobacco or illicit drugs 3. Their current medications and supplements 4. The patient's functional ability including ADL's, fall risks, home safety risks and hearing or visual impairment. 5. Diet and physical activities 6. Evidence for depression or mood disorder   Colonoscopy: In 2024, was WNL, can dc LDCT: N/A AAA Screening: Brief hx of smoking. Never been done, declines Labs: Lipid and A1c UTD Immunizations: Due for 2nd shingrix, prevnar given today, declines covid Diet and Exercise: Skips meals sometimes, walking a couple of times week,  Sleep and mood: Good Dental and vision:UTD ADLs and IADLs: Capable Cognitive: Intact to orientation, naming, recall and repetition Fall risk and home safety: No concerns Health counselling given: Exercise to improve balance and reduce fall risk     Benign essential hypertension Frequent urination likely due to hydrochlorothiazide . Decision to switch to valsartan  alone.  - Discontinued valsartan -hydrochlorothiazide . - Prescribed valsartan  320 mg daily. - Sent prescription to [facility] in [place]. - Monitor for improvement in frequent urination.   Hyperlipidemia Medication regimen well-managed with Lipitor 40 mg nightly.  - Continue Lipitor 40 mg nightly. -  Repeat lipid panel in one year.   Cognitive changes Memory impairment normal aging versus dementia.  Lab workup unremarkable, CT of the head showed age-related atrophy.  - Schedule MOCA test in four weeks. - Discuss potential blood tests for Alzheimer's at next visit.  Aortic valve sclerosis Systolic murmur present. Previous echocardiogram from 2021 showed normal EF, aortic valve sclerosis from calcifications but no other structural abnormalities of the valve or the rest of the heart.  Given the patient is asymptomatic at this time, no need for repeat, we will simply continue to monitor.   Follow-up: Return in about 4 weeks (around 06/19/2024) for Memory changes and MOCA.        Subjective:   Patient ID: Ricardo Reese, male    DOB: 02/24/1951  Age: 73 y.o. MRN: [id number]  Patient Care Team: Bennett Reuben POUR, MD as PCP - General (Family Medicine) Fate Morna SAILOR, [facility] (Inactive) as Pharmacist (Pharmacist) Patel, Donika K, DO as Consulting Physician (Neurology)   CC:  Chief Complaint  Patient presents with   Annual Exam    Here with wife. Says hs hands are cold all the time and his hands will shake. He says its from too much caffeine. Thirsty more often. He does not always eat 3 meals a day.  More frequent urination. He is on hydrochlorothiazide  and drinks more water.      Ricardo Reese is a 73 y.o. male here for annual physical and f/u on chronic conditions, see assessment and plan for further details  Discussed the use of AI scribe software for clinical note transcription with the patient, who gave verbal consent to proceed.  History of Present Illness Ricardo Reese is a 73 year old male who presents for a routine physical exam.  He experiences frequent urination, which is noticeable but not particularly bothersome. No lightheadedness, chest pain, or shortness of breath.  He has a history of prediabetes and sometimes skips meals, particularly lunch. His spouse  encourages him to eat regular meals and snacks. He occasionally eats junk food when skipping meals. His A1c levels have remained stable over the past six months.  He is currently taking Lipitor 40 mg every night for cholesterol management. His cholesterol medication was recently changed, and he is due for a repeat cholesterol check in a year.  He had a colonoscopy in 2024, which showed no polyps. He has a history of polyps but none were found in the last procedure.  He quit smoking over fifty years ago and smoked very lightly. He has no significant heart or lung issues and is not currently interested in an aneurysm screening ultrasound.  He had a head scan that showed age-related atrophy but no masses or other abnormalities. He experiences memory changes, which are attributed to aging.  He received his first dose of the shingles vaccine and plans to get the second dose in two months. He is up to date on his tetanus and pneumonia vaccines.  He engages in physical activity by playing golf and walking, although the frequency of walking is affected by the weather. He sleeps well and has no mood issues. He has no issues with daily activities such as dressing, using the phone, or grocery shopping. He has not experienced any falls in the last three years.     Advanced Directives Has living will Wife is health care POA--- daughters next Would accept resuscitation No tube feeds if cognitively unaware   Depression Screening;    05/22/2024   11:51 AM 04/05/2024   10:47 AM 01/25/2024    9:19 AM 11/13/2023   11:30 AM 01/24/2023    8:17 AM 01/24/2023    7:51 AM 12/31/2021    8:30 AM  PHQ 2/9 Scores  PHQ - 2 Score 0 0 0 0 0 0 0  PHQ- 9 Score   0          Data saved with a previous flowsheet row definition     Anxiety Screening:     No data to display           ROS: Negative unless specifically indicated above in HPI.       Objective:     BP 124/66 (BP Location: Left Arm, Patient  Position: Sitting, Cuff Size: Large)   Pulse 63   Temp 97.8 F (36.6 C) (Oral)   Ht 5' 9 (1.753 m)   Wt 201 lb (91.2 kg)   SpO2 98%   BMI 29.68 kg/m    Physical Exam GENERAL: Alert, cooperative, well developed, no acute distress. HEAD: Normocephalic atraumatic. EYES: Extraocular movements intact bilaterally, pupils round, equal and reactive to light bilaterally, conjunctivae normal bilaterally. EARS: Cerumen impaction, unable to visualize tympanic membrane. NOSE: No congestion or rhinorrhea, mucous membranes are moist. THROAT: No oropharyngeal exudate or posterior oropharyngeal erythema. CARDIOVASCULAR: Systolic murmur  CHEST: Clear to auscultation bilaterally, no wheezes, rhonchi, or crackles. ABDOMEN: Soft, non tender, non distended, without organomegaly, normal bowel sounds. EXTREMITIES: No cyanosis or edema. NEUROLOGICAL: Oriented to person, place and time, no gait abnormalities, moves all extremities without gross motor or sensory deficit. NECK: Supple, thyroid  normal. SKIN: Healed post surgical scar over right anterior abdomen.     Zahniya Zellars K Sayla Golonka, MD  05/22/24    Contains text generated by [employer]  Software.

## 2024-05-22 NOTE — Patient Instructions (Signed)
 Thank you for visiting [facility] today! Here's what we talked about: - STOP the combination blood pressure pill and start valsartan  alone

## 2024-05-22 NOTE — Progress Notes (Signed)
 Had Shingrix #1 05-16-24 Goes to dentist 2 times a year.

## 2024-05-28 ENCOUNTER — Ambulatory Visit: Admitting: Podiatry

## 2024-05-30 ENCOUNTER — Ambulatory Visit (INDEPENDENT_AMBULATORY_CARE_PROVIDER_SITE_OTHER): Admitting: Podiatry

## 2024-05-30 DIAGNOSIS — B351 Tinea unguium: Secondary | ICD-10-CM | POA: Diagnosis not present

## 2024-05-30 DIAGNOSIS — L853 Xerosis cutis: Secondary | ICD-10-CM | POA: Diagnosis not present

## 2024-05-30 DIAGNOSIS — M79674 Pain in right toe(s): Secondary | ICD-10-CM | POA: Diagnosis not present

## 2024-05-30 DIAGNOSIS — M79675 Pain in left toe(s): Secondary | ICD-10-CM | POA: Diagnosis not present

## 2024-05-30 MED ORDER — AMMONIUM LACTATE 12 % EX LOTN: 1.0000 | TOPICAL_LOTION | CUTANEOUS | 0 refills | Status: AC | PRN

## 2024-05-30 NOTE — Progress Notes (Unsigned)
 Toenails x 10  Xerosis skin ammonium lactate 

## 2024-06-18 ENCOUNTER — Other Ambulatory Visit: Payer: Self-pay

## 2024-06-18 ENCOUNTER — Ambulatory Visit

## 2024-06-18 VITALS — BP 140/70 | HR 58 | Temp 97.7°F | Ht 69.5 in | Wt 210.0 lb

## 2024-06-18 DIAGNOSIS — R4189 Other symptoms and signs involving cognitive functions and awareness: Secondary | ICD-10-CM | POA: Diagnosis not present

## 2024-06-18 NOTE — Progress Notes (Addendum)
 "  Subjective:   This visit was conducted in person. The patient gave informed consent to the use of Abridge AI technology to record the contents of the encounter as documented below.   Patient ID: Ricardo Reese, male    DOB: 1950/11/19, 74 y.o.   MRN: [id number]   Discussed the use of AI scribe software for clinical note transcription with the patient, who gave verbal consent to proceed.  History of Present Illness Ricardo Reese is a 74 year old male who presents with memory changes and confusion.  He has been experiencing memory changes and confusion, particularly with familiar locations and tasks. He has difficulty remembering familiar places, such as finding the bathroom at his daughter's house during family gatherings. He also experiences confusion with tasks like driving to different golf courses, now preferring to visit only the one where he works.  His wife notes that he can recall current events and details from his job and sometimes finishes her sentences. However, he struggles with remembering certain words and familiar places. Over the past two months, there has been a significant change in his golfing habits, reducing from three to four times a week to once a week, and only if accompanied by a friend.  In terms of daily functioning, he has no issues with dressing, going to the bathroom, or performing daily tasks independently. He continues to drive and shop for groceries without difficulty.   Review of Systems  All other systems reviewed and are negative.       Allergies[1]  Medications Ordered Prior to Encounter[2]  BP (!) 140/70   Pulse (!) 58   Temp 97.7 F (36.5 C) (Oral)   Ht 5' 9.5 (1.765 m)   Wt 210 lb (95.3 kg)   SpO2 97%   BMI 30.57 kg/m   Objective:      Physical Exam GENERAL: Alert, cooperative, well developed, no acute distress. HEAD: Normocephalic atraumatic. EYES: Extraocular movements intact BL, pupils round, equal and reactive to light BL,  conjunctivae normal BL. EXTREMITIES: No cyanosis or edema. NEUROLOGICAL: Oriented to person, place and time, no gait abnormalities, moves all extremities without gross motor or sensory deficit.         Assessment & Plan:    Assessment & Plan Cognitive changes MRI showed age-related cerebral atrophy. No structural abnormalities.  Lab workup WNL.  No deficits in ADLs/IADLs. No family history of dementia, but will rule out given reports of lapses in spatial awareness, directions when driving as well as confusion in navigating his daughter's house, which he previously did not struggle with.  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 Will order AD biomarker test.  Neurological exam WNL.  - Follow up [id number] test, if positive, will need neurology referral for further interventions. -Counseled patient continue mentally stimulating activities including sudoku to mitigate further cognitive decline   Return in about 3 months (around 09/30/2024) for Diabetic visit .   [name], MD  06/18/2024     Contains text generated by Abridge.        [1] No Known Allergies [2]  Current Outpatient Medications on File Prior to Visit  Medication Sig Dispense Refill   acetaminophen  (TYLENOL ) 325 MG tablet Take 1-2 tablets (325-650 mg total) by mouth every 6 (six) hours as needed for mild pain (pain score 1-3) (or temp > 100.5). 90 tablet 0   amLODipine  (NORVASC ) 5 MG tablet TAKE 1 TABLET BY MOUTH EVERY DAY 90 tablet 1   ammonium lactate  (  AMLACTIN DAILY) 12 % lotion Apply 1 Application topically as needed. 400 g 0   ammonium lactate  (AMLACTIN) 12 % lotion Apply 1 application topically as needed for dry skin. 400 g 1   atorvastatin  (LIPITOR) 40 MG tablet Take 1 tablet (40 mg total) by mouth at bedtime. 90 tablet 1   B Complex-C (SUPER B COMPLEX PO) Take 1 tablet by mouth in the morning.     fish oil-omega-3 fatty acids 1000 MG capsule Take 1 g by mouth in the morning.     Magnesium 400 MG TABS       metFORMIN  (GLUCOPHAGE ) 500 MG tablet Take 1 tablet (500 mg total) by mouth 2 (two) times daily with a meal. 180 tablet 3   Multiple Vitamin (MULTIVITAMIN WITH MINERALS) TABS tablet Take 1 tablet by mouth in the morning.     sildenafil  (REVATIO ) 20 MG tablet Take 5 tablets (100 mg total) by mouth daily as needed. 50 tablet 11   valsartan  (DIOVAN ) 320 MG tablet Take 1 tablet (320 mg total) by mouth daily. 90 tablet 3   No current facility-administered medications on file prior to visit.   "

## 2024-06-18 NOTE — Patient Instructions (Addendum)
 Thank you for visiting [facility] today! Here's what we talked about: - I will be in touch about test results - Stay active with sudoku or crossword puzzles, these help to mitigate cognitive decline

## 2024-06-19 NOTE — Addendum Note (Signed)
 Addended by: Joden Bonsall K on: 06/19/2024 07:28 AM   Modules accepted: Orders

## 2024-06-19 NOTE — Addendum Note (Signed)
 Addended by: HOPE VEVA PARAS on: 06/19/2024 03:01 PM   Modules accepted: Orders

## 2024-06-24 NOTE — Telephone Encounter (Signed)
 Copied from CRM #[id number]. Topic: Clinical - Medical Advice >> Jun 24, 2024 11:23 AM Thersia BROCKS wrote: Reason for CRM: Patient wife,  called in regarding following up on labs, would like a callback  [phone]

## 2024-06-24 NOTE — Telephone Encounter (Signed)
 Called pts wife. She said no one from our office has called them to schedule labs. I made him an appt for 06-26-24

## 2024-06-26 ENCOUNTER — Other Ambulatory Visit

## 2024-06-26 DIAGNOSIS — R4189 Other symptoms and signs involving cognitive functions and awareness: Secondary | ICD-10-CM

## 2024-07-01 ENCOUNTER — Ambulatory Visit: Admitting: Orthopaedic Surgery

## 2024-07-01 ENCOUNTER — Other Ambulatory Visit (INDEPENDENT_AMBULATORY_CARE_PROVIDER_SITE_OTHER): Payer: Self-pay

## 2024-07-01 ENCOUNTER — Encounter: Payer: Self-pay | Admitting: Orthopaedic Surgery

## 2024-07-01 DIAGNOSIS — Z96651 Presence of right artificial knee joint: Secondary | ICD-10-CM | POA: Diagnosis not present

## 2024-07-01 NOTE — Progress Notes (Signed)
 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  We replaced his left hip back in 2018.  He is an active 74 year old gentleman.  He denies any issues at all with either joint.  He is very active and doing well.  His wife is with him today as well.  On exam his right knee moves smoothly and fluidly.  It is like in place table with no swelling.  Standing AP and lateral of the right knee today shows a well-seated press-fit total knee replacement with no complicating features.  At this point follow-up for his right knee and left hip are as needed.  If he does develop any issues at all or other orthopedic concerns he knows to reach out to us .  All questions and concerns were addressed and answered.

## 2024-07-02 LAB — PHOSPHORYLATED TAU 217 (PTAU-217), PLASMA: P-TAU 217 [20986]: 1.15 pg/mL — ABNORMAL HIGH (ref 0.00–0.18)

## 2024-07-03 ENCOUNTER — Ambulatory Visit: Payer: Self-pay

## 2024-07-03 DIAGNOSIS — R4189 Other symptoms and signs involving cognitive functions and awareness: Secondary | ICD-10-CM

## 2024-08-29 ENCOUNTER — Ambulatory Visit: Admitting: Podiatry

## 2024-09-16 ENCOUNTER — Ambulatory Visit

## 2024-10-01 ENCOUNTER — Ambulatory Visit: Payer: Self-pay | Admitting: Neurology

## 2025-02-03 ENCOUNTER — Ambulatory Visit

## 2025-05-26 ENCOUNTER — Encounter
